# Patient Record
Sex: Male | Born: 1945 | Race: White | Hispanic: No | Smoking: Former smoker
Health system: Southern US, Community
[De-identification: ages and names within clinical notes are randomized; demographics above are authoritative.]

## PROBLEM LIST (undated history)

## (undated) DIAGNOSIS — G8929 Other chronic pain: Secondary | ICD-10-CM

## (undated) DIAGNOSIS — I5032 Chronic diastolic (congestive) heart failure: Secondary | ICD-10-CM

## (undated) DIAGNOSIS — E538 Deficiency of other specified B group vitamins: Secondary | ICD-10-CM

## (undated) DIAGNOSIS — N400 Enlarged prostate without lower urinary tract symptoms: Secondary | ICD-10-CM

## (undated) DIAGNOSIS — J449 Chronic obstructive pulmonary disease, unspecified: Secondary | ICD-10-CM

## (undated) DIAGNOSIS — E785 Hyperlipidemia, unspecified: Secondary | ICD-10-CM

## (undated) DIAGNOSIS — I714 Abdominal aortic aneurysm, without rupture, unspecified: Secondary | ICD-10-CM

## (undated) DIAGNOSIS — R635 Abnormal weight gain: Secondary | ICD-10-CM

## (undated) DIAGNOSIS — I10 Essential (primary) hypertension: Secondary | ICD-10-CM

## (undated) DIAGNOSIS — R911 Solitary pulmonary nodule: Secondary | ICD-10-CM

## (undated) DIAGNOSIS — K219 Gastro-esophageal reflux disease without esophagitis: Secondary | ICD-10-CM

## (undated) DIAGNOSIS — I4892 Unspecified atrial flutter: Secondary | ICD-10-CM

## (undated) DIAGNOSIS — G4730 Sleep apnea, unspecified: Secondary | ICD-10-CM

## (undated) DIAGNOSIS — F40240 Claustrophobia: Secondary | ICD-10-CM

## (undated) DIAGNOSIS — R9389 Abnormal findings on diagnostic imaging of other specified body structures: Secondary | ICD-10-CM

## (undated) HISTORY — DX: Abdominal aortic aneurysm, without rupture: I71.4

## (undated) HISTORY — PX: JOINT REPLACEMENT: SHX530

## (undated) HISTORY — DX: Other chronic pain: G89.29

## (undated) HISTORY — DX: Abnormal findings on diagnostic imaging of other specified body structures: R93.89

## (undated) HISTORY — DX: Unspecified atrial flutter: I48.92

## (undated) HISTORY — PX: BACK SURGERY: SHX140

## (undated) HISTORY — PX: KNEE SURGERY: SHX244

## (undated) HISTORY — DX: Abnormal weight gain: R63.5

## (undated) HISTORY — DX: Abdominal aortic aneurysm, without rupture, unspecified: I71.40

## (undated) HISTORY — PX: KYPHOSIS SURGERY: SHX114

## (undated) HISTORY — PX: CARDIOVERSION: SHX1299

## (undated) HISTORY — PX: OTHER SURGICAL HISTORY: SHX169

## (undated) HISTORY — DX: Solitary pulmonary nodule: R91.1

---
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
## 2004-02-12 ENCOUNTER — Inpatient Hospital Stay (HOSPITAL_BASED_OUTPATIENT_CLINIC_OR_DEPARTMENT_OTHER): Admission: RE | Admit: 2004-02-12 | Discharge: 2004-02-12 | Payer: Self-pay | Admitting: Cardiology

## 2004-02-12 ENCOUNTER — Ambulatory Visit (HOSPITAL_COMMUNITY): Admission: RE | Admit: 2004-02-12 | Discharge: 2004-02-12 | Payer: Self-pay | Admitting: Cardiology

## 2005-08-05 ENCOUNTER — Ambulatory Visit (HOSPITAL_COMMUNITY): Admission: RE | Admit: 2005-08-05 | Discharge: 2005-08-05 | Payer: Self-pay | Admitting: Optometry

## 2005-08-05 ENCOUNTER — Ambulatory Visit: Payer: Self-pay | Admitting: Internal Medicine

## 2008-08-16 ENCOUNTER — Ambulatory Visit: Payer: Self-pay | Admitting: *Deleted

## 2008-08-16 ENCOUNTER — Emergency Department (HOSPITAL_COMMUNITY): Admission: EM | Admit: 2008-08-16 | Discharge: 2008-08-17 | Payer: Self-pay | Admitting: Emergency Medicine

## 2008-08-17 ENCOUNTER — Ambulatory Visit: Payer: Self-pay | Admitting: Internal Medicine

## 2008-08-17 ENCOUNTER — Ambulatory Visit: Payer: Self-pay | Admitting: Cardiology

## 2008-08-17 ENCOUNTER — Encounter (INDEPENDENT_AMBULATORY_CARE_PROVIDER_SITE_OTHER): Payer: Self-pay | Admitting: Internal Medicine

## 2008-08-17 ENCOUNTER — Inpatient Hospital Stay (HOSPITAL_COMMUNITY): Admission: AD | Admit: 2008-08-17 | Discharge: 2008-08-25 | Payer: Self-pay | Admitting: Internal Medicine

## 2008-08-23 ENCOUNTER — Ambulatory Visit: Payer: Self-pay | Admitting: Cardiovascular Disease

## 2008-09-08 ENCOUNTER — Ambulatory Visit: Payer: Self-pay | Admitting: Internal Medicine

## 2008-09-08 DIAGNOSIS — I4892 Unspecified atrial flutter: Secondary | ICD-10-CM

## 2008-09-08 DIAGNOSIS — I483 Typical atrial flutter: Secondary | ICD-10-CM | POA: Insufficient documentation

## 2008-09-08 DIAGNOSIS — J984 Other disorders of lung: Secondary | ICD-10-CM

## 2008-09-08 DIAGNOSIS — J449 Chronic obstructive pulmonary disease, unspecified: Secondary | ICD-10-CM

## 2008-09-15 ENCOUNTER — Ambulatory Visit: Payer: Self-pay | Admitting: Cardiology

## 2008-09-15 ENCOUNTER — Ambulatory Visit: Payer: Self-pay | Admitting: Cardiovascular Disease

## 2008-10-09 ENCOUNTER — Ambulatory Visit: Payer: Self-pay | Admitting: Internal Medicine

## 2008-10-09 DIAGNOSIS — I2789 Other specified pulmonary heart diseases: Secondary | ICD-10-CM | POA: Insufficient documentation

## 2008-11-08 ENCOUNTER — Telehealth (INDEPENDENT_AMBULATORY_CARE_PROVIDER_SITE_OTHER): Payer: Self-pay | Admitting: *Deleted

## 2008-11-20 ENCOUNTER — Ambulatory Visit: Payer: Self-pay | Admitting: Internal Medicine

## 2008-12-26 ENCOUNTER — Encounter: Payer: Self-pay | Admitting: *Deleted

## 2009-01-31 ENCOUNTER — Encounter: Payer: Self-pay | Admitting: *Deleted

## 2009-02-16 ENCOUNTER — Ambulatory Visit: Payer: Self-pay | Admitting: Internal Medicine

## 2009-02-21 ENCOUNTER — Ambulatory Visit: Payer: Self-pay | Admitting: Cardiology

## 2009-02-21 ENCOUNTER — Telehealth (INDEPENDENT_AMBULATORY_CARE_PROVIDER_SITE_OTHER): Payer: Self-pay | Admitting: *Deleted

## 2009-02-21 ENCOUNTER — Encounter: Payer: Self-pay | Admitting: *Deleted

## 2009-02-22 ENCOUNTER — Ambulatory Visit: Payer: Self-pay

## 2009-02-22 ENCOUNTER — Encounter: Payer: Self-pay | Admitting: Internal Medicine

## 2009-02-26 ENCOUNTER — Encounter: Payer: Self-pay | Admitting: Internal Medicine

## 2009-02-26 ENCOUNTER — Telehealth: Payer: Self-pay | Admitting: Cardiology

## 2009-03-01 ENCOUNTER — Encounter: Payer: Self-pay | Admitting: Internal Medicine

## 2009-03-12 ENCOUNTER — Inpatient Hospital Stay (HOSPITAL_COMMUNITY): Admission: RE | Admit: 2009-03-12 | Discharge: 2009-03-19 | Payer: Self-pay | Admitting: Orthopedic Surgery

## 2009-04-23 ENCOUNTER — Ambulatory Visit: Payer: Self-pay | Admitting: Internal Medicine

## 2009-04-24 ENCOUNTER — Encounter: Payer: Self-pay | Admitting: Internal Medicine

## 2009-05-14 ENCOUNTER — Telehealth (INDEPENDENT_AMBULATORY_CARE_PROVIDER_SITE_OTHER): Payer: Self-pay | Admitting: *Deleted

## 2009-05-16 ENCOUNTER — Encounter: Payer: Self-pay | Admitting: Internal Medicine

## 2009-07-05 ENCOUNTER — Ambulatory Visit: Payer: Self-pay | Admitting: Internal Medicine

## 2009-07-13 ENCOUNTER — Ambulatory Visit: Payer: Self-pay | Admitting: Cardiovascular Disease

## 2009-07-13 ENCOUNTER — Telehealth (INDEPENDENT_AMBULATORY_CARE_PROVIDER_SITE_OTHER): Payer: Self-pay | Admitting: *Deleted

## 2009-08-20 ENCOUNTER — Ambulatory Visit: Payer: Self-pay | Admitting: Internal Medicine

## 2009-10-27 ENCOUNTER — Inpatient Hospital Stay (HOSPITAL_COMMUNITY): Admission: EM | Admit: 2009-10-27 | Discharge: 2009-11-04 | Payer: Self-pay | Admitting: Emergency Medicine

## 2009-11-02 ENCOUNTER — Encounter: Payer: Self-pay | Admitting: Orthopedic Surgery

## 2009-11-16 ENCOUNTER — Encounter: Payer: Self-pay | Admitting: Interventional Radiology

## 2009-12-26 ENCOUNTER — Encounter: Admission: RE | Admit: 2009-12-26 | Discharge: 2009-12-26 | Payer: Self-pay | Admitting: Family Medicine

## 2010-01-11 ENCOUNTER — Encounter (INDEPENDENT_AMBULATORY_CARE_PROVIDER_SITE_OTHER): Payer: Self-pay | Admitting: *Deleted

## 2010-01-25 ENCOUNTER — Encounter: Payer: Self-pay | Admitting: Cardiology

## 2010-02-06 ENCOUNTER — Ambulatory Visit: Payer: Self-pay | Admitting: Cardiology

## 2010-02-06 DIAGNOSIS — I739 Peripheral vascular disease, unspecified: Secondary | ICD-10-CM

## 2010-02-07 ENCOUNTER — Encounter: Payer: Self-pay | Admitting: Cardiology

## 2010-02-18 ENCOUNTER — Telehealth (INDEPENDENT_AMBULATORY_CARE_PROVIDER_SITE_OTHER): Payer: Self-pay | Admitting: *Deleted

## 2010-06-10 ENCOUNTER — Telehealth (INDEPENDENT_AMBULATORY_CARE_PROVIDER_SITE_OTHER): Payer: Self-pay | Admitting: *Deleted

## 2010-06-11 ENCOUNTER — Telehealth (INDEPENDENT_AMBULATORY_CARE_PROVIDER_SITE_OTHER): Payer: Self-pay | Admitting: *Deleted

## 2010-06-19 ENCOUNTER — Ambulatory Visit (HOSPITAL_COMMUNITY)
Admission: RE | Admit: 2010-06-19 | Discharge: 2010-06-20 | Payer: Self-pay | Source: Home / Self Care | Admitting: Orthopedic Surgery

## 2010-06-25 ENCOUNTER — Telehealth: Payer: Self-pay | Admitting: Cardiology

## 2010-07-12 ENCOUNTER — Ambulatory Visit (HOSPITAL_COMMUNITY)
Admission: RE | Admit: 2010-07-12 | Discharge: 2010-07-12 | Payer: Self-pay | Source: Home / Self Care | Attending: Interventional Radiology | Admitting: Interventional Radiology

## 2010-07-18 ENCOUNTER — Ambulatory Visit (HOSPITAL_COMMUNITY)
Admission: RE | Admit: 2010-07-18 | Discharge: 2010-07-18 | Payer: Self-pay | Source: Home / Self Care | Attending: Interventional Radiology | Admitting: Interventional Radiology

## 2010-08-15 IMAGING — CT CT CHEST W/ CM
3 series · 17 of 29 positions shown, 19 images · IV contrast (80 ml omni 300)
Comparison: Chest radiograph 08/16/2008

CLINICAL DATA: Shortness of breath, abnormal chest x-ray

CT CHEST WITH CONTRAST
TECHNIQUE: Multidetector CT imaging of the chest was performed
following the standard protocol during bolus administration of
intravenous contrast.
Contrast: 80 ml Omnipaque

[Series 2: routine chest · axial · 0.91mm/px · z∈[-369,-149]mm · 5 of 74 slices shown, 7 images]
[im 15/74  mediastinal]
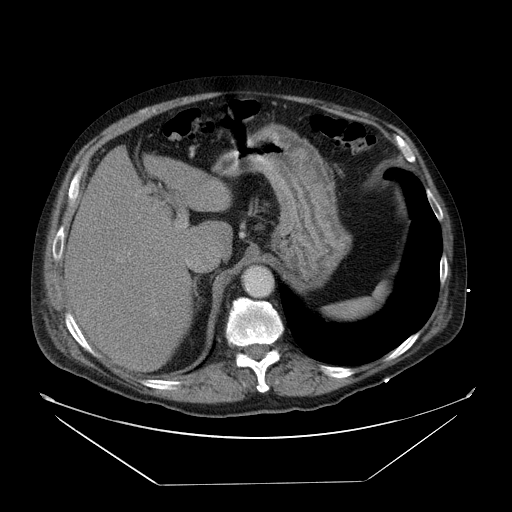
[im 15/74  lung]
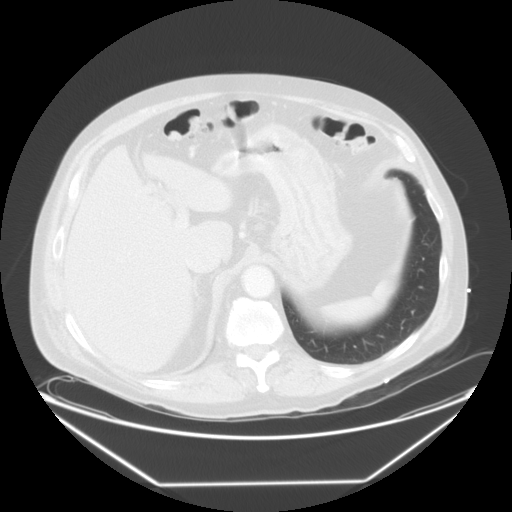
[im 30/74  lung]
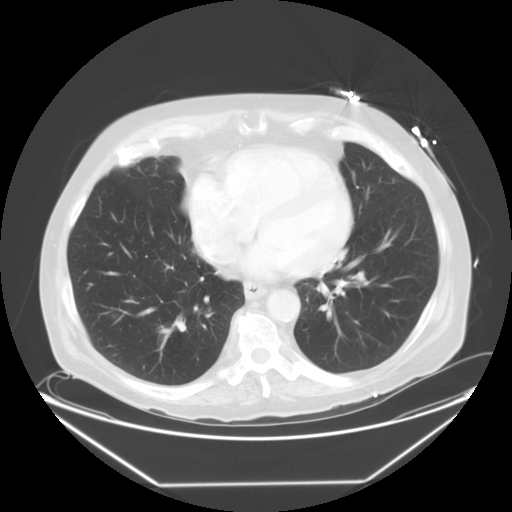
[im 41/74  lung]
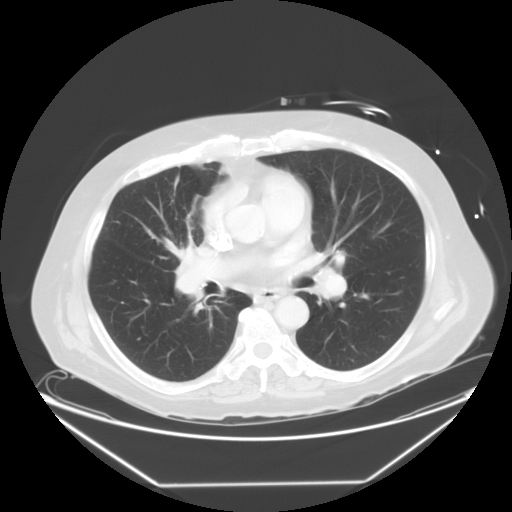
[im 44/74  lung]
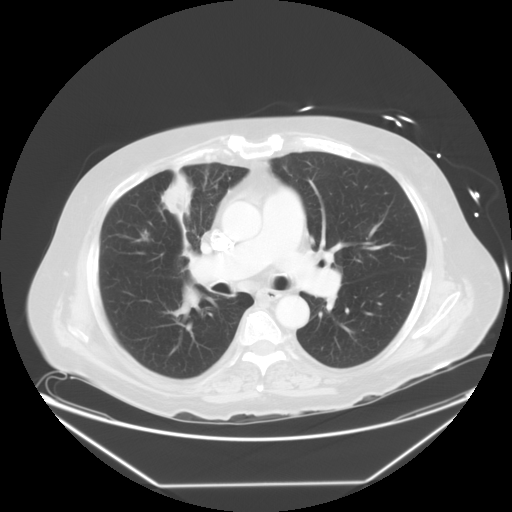
[im 59/74  mediastinal]
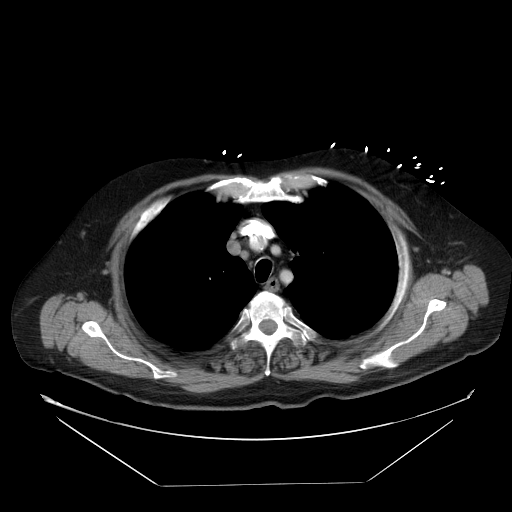
[im 59/74  lung]
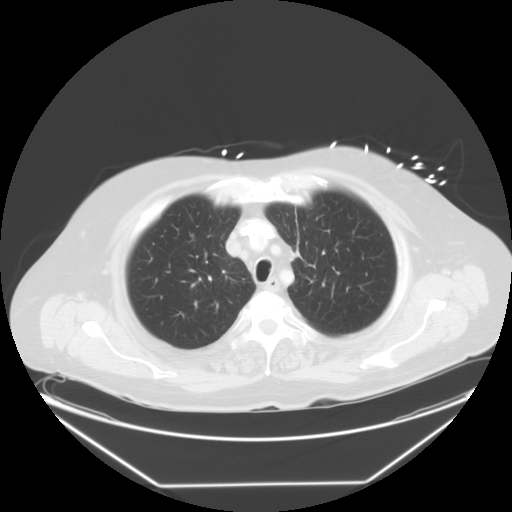

[Series 400: reformatted · sagittal · 0.90mm/px · 8 of 143 slices shown (1 of 2)]
[im 12/143  lung]
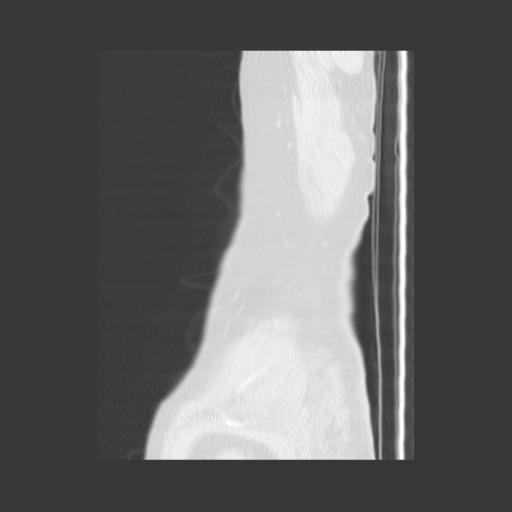
[im 36/143  lung]
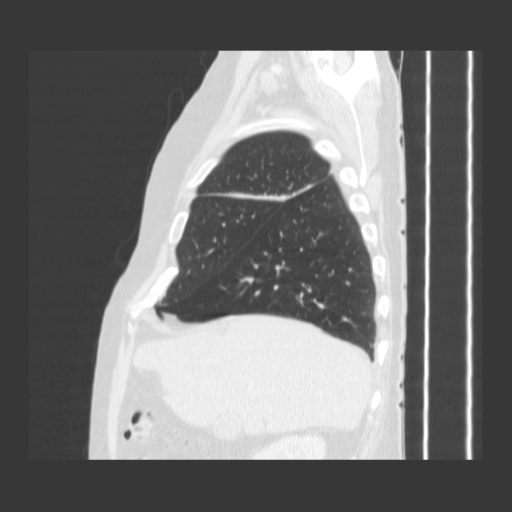
[im 48/143  lung]
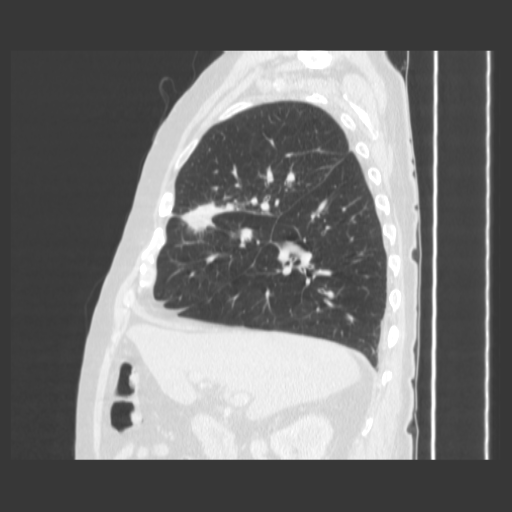
[im 60/143  lung]
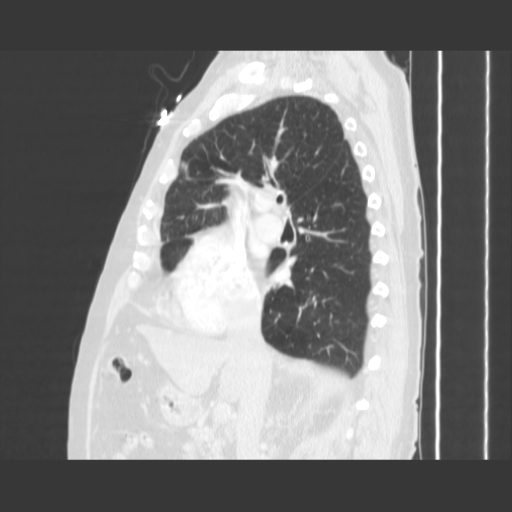
[im 83/143  lung]
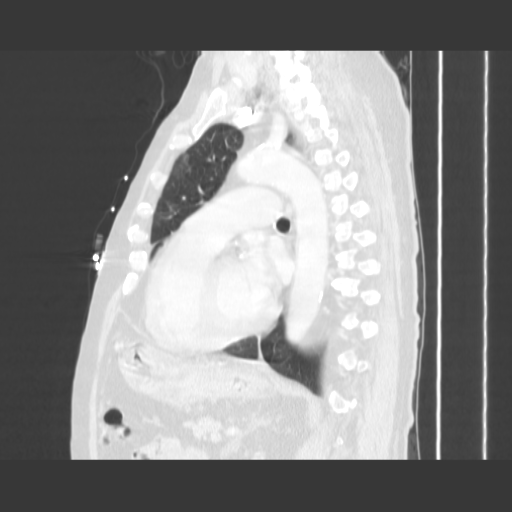
[im 95/143  lung]
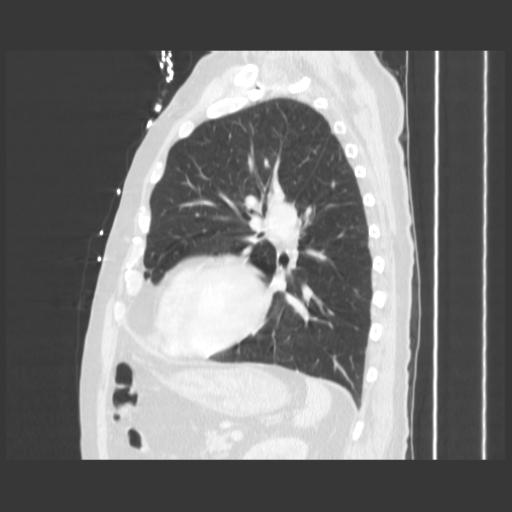
[im 107/143  lung]
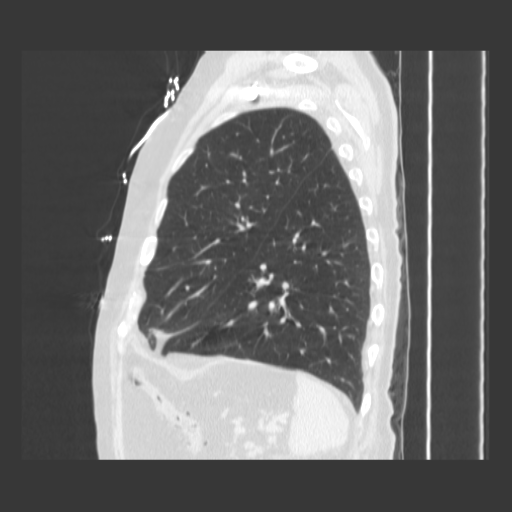
[im 131/143  lung]
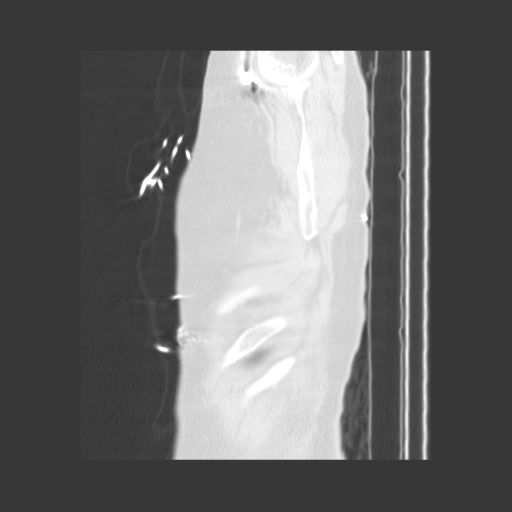

[Series 401: reformatted · coronal · 0.90mm/px · 4 of 96 slices shown (2 of 2)]
[im 12/96  lung]
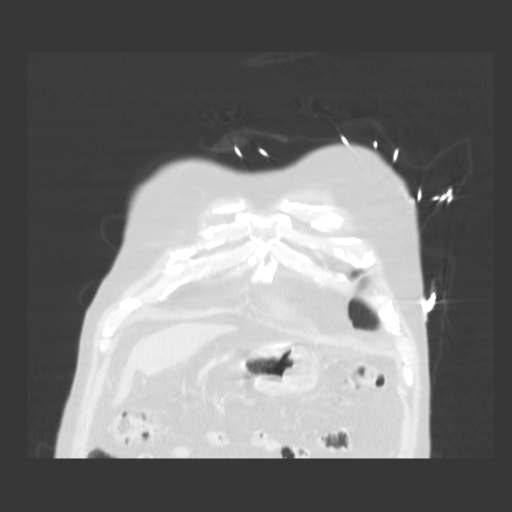
[im 24/96  lung]
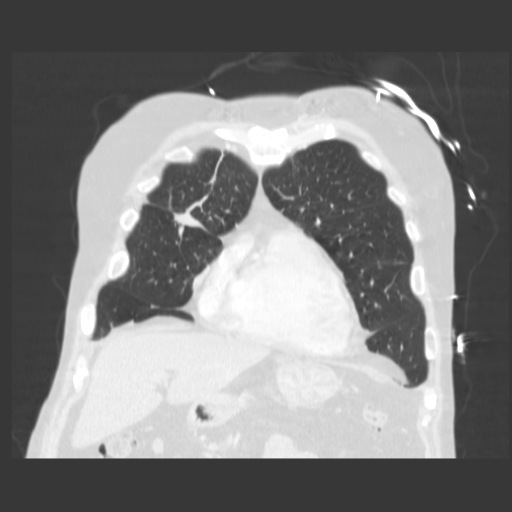
[im 36/96  lung]
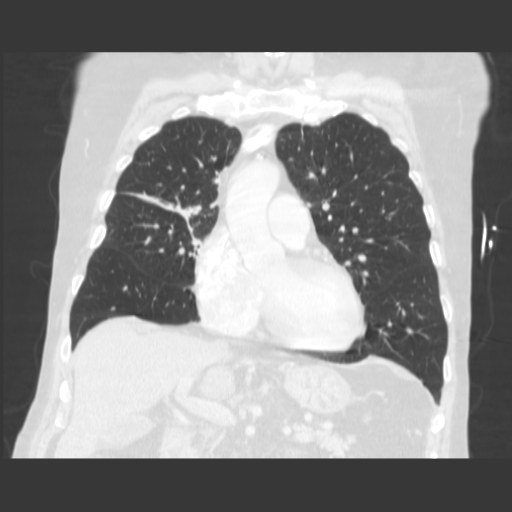
[im 48/96  lung]
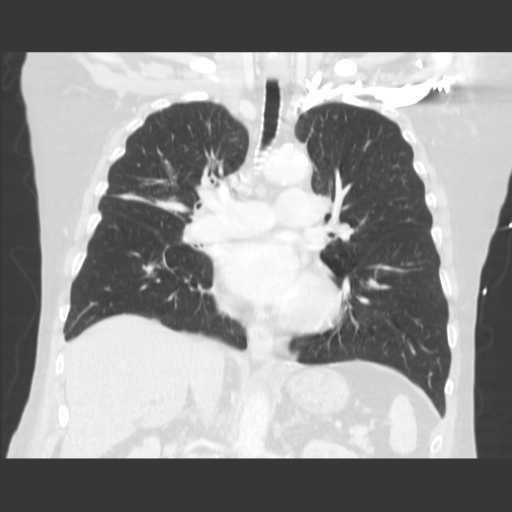

[17 of 29 positions shown; findings below may reference images not displayed]

FINDINGS: No evidence of axillary or supraclavicular.  There is
mild mediastinal lymph adenopathy with 1 cm short axis right lower
paratracheal, precarinal, subcarinal lymph nodes.  No pericardial
effusion.

There is pleural parenchymal thickening along the horizontal
fissure in the right lung which corresponds to the abnormality on
chest radiograph.  This pleural parenchymal thickening measures 6
cm x 2.5 cm x 2.5 cm in greatest dimension. Additional minor
thickening along the fissure.  There is an 11 millimeter nodule
over the right hemidiaphragm (image 49).  This is likely subpleural
in location.  The airway appears normal.

Limited view of the upper abdomen demonstrates a tiny 8 mm
hypodensity within the anterior right hepatic lobe which too small
to characterize.  A second hypodensity measuring 11 mm within the
caudate lobe.  Adrenal glands appear normal.

Limited view of the skeleton is unremarkable.
IMPRESSION: 1.  Mass like pleural parenchymal thickening along the right
horizontal fissure.  This has an appearance of chronic inflammatory
process; however cannot rule out a low grade adenocarcinoma which
can have similar appearance.  I would recommend a follow-up CT and
in 3 to 6 months to evaluate stability.  Alternatively a
percutaneous biopsy may be warranted.  Consider thoracic surgery
consult.
2.  An 11 mm subpleural nodule over the right diaphragm.  Recommend
attention on follow-up additionally.
3.  Outside radiographs or CT  may be of value in establishing
chronicity; however,  I would still recommend follow-up.

## 2010-08-18 ENCOUNTER — Encounter: Payer: Self-pay | Admitting: Family Medicine

## 2010-08-28 NOTE — Letter (Signed)
Summary: Appointment - Reschedule  [facility], Main Office  [address]   [place], [place] [address]   Phone: [phone]  Fax: [phone]     January 11, 2010 MRN: [id number]   [facility] [address] [place], [place]  [address]   Dear Mr. Bennette,   Due to a change in our office schedule, your appointment on  June 22,2011 at 11:45 must be changed.  It is very important that we reach you to reschedule this appointment. We look forward to participating in your health care needs. Please contact us at the number listed above at your earliest convenience to reschedule this appointment.     Sincerely, Harrie Foreman Scheduling Team

## 2010-08-28 NOTE — Miscellaneous (Signed)
  Clinical Lists Changes  Observations: Added new observation of NUCLEAR NOS: Exercise Capacity: Adenosine study with no exercise. ECG Impression: Baseline: NSR with RBBB; No significant ST segment change with adenosine. Overall Impression: Abnormal stress nuclear study. 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 However there is no corresponding wall motion abnormality on SPECT imaging so may be diaphragmatic attenuation. No ischemia. There is increased RV uptake suggested of elevated R-sided pressures.    (02/22/2009 15:52)      Nuclear Study  Procedure date:  02/22/2009  Findings:      Exercise Capacity: Adenosine study with no exercise. ECG Impression: Baseline: NSR with RBBB; No significant ST segment change with adenosine. Overall Impression: Abnormal stress nuclear study. 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 However there is no corresponding wall motion abnormality on SPECT imaging so may be diaphragmatic attenuation. No ischemia. There is increased RV uptake suggested of elevated R-sided pressures.

## 2010-08-28 NOTE — Assessment & Plan Note (Signed)
Summary:  Cardiology  Medications Added DILTIAZEM HCL CR 120 MG XR12H-CAP (DILTIAZEM HCL) one twice a day OMEPRAZOLE 20 MG CPDR (OMEPRAZOLE) 1 by mouth daily AMBIEN 10 MG TABS (ZOLPIDEM TARTRATE) as needed MULTIVITAMINS   TABS (MULTIPLE VITAMIN) 1 by mouth daily CALCIUM 1200-1000 MG-UNIT CHEW (CALCIUM CARBONATE-VIT D-MIN) 1 by mouth daily CYCLOBENZAPRINE HCL 10 MG TABS (CYCLOBENZAPRINE HCL) as needed FOSAMAX 70 MG TABS (ALENDRONATE SODIUM) 1 by mouth weekly FLOMAX 0.4 MG CAPS (TAMSULOSIN HCL) 1 by mouth at bedtime      Allergies Added: NKDA  Visit Type:  Follow-up Primary Provider:  Dr. Ardeen Garland  CC:  Atrial Flutter.  History of Present Illness: The present for followup of the above. Since I last saw him in the office he was hospitalized with back pain requiring kyphoplasty. His Coumadin was interrupted for this. He is still having back pain and in particular left hip pain related to an orthopedic problem. He does ambulate with a cane and gets around as best he can. He denies any cardiac complaints and in particular has had no palpitations, presyncope or syncope. He has had no chest pressure, neck or arm discomfort. He has had no new shortness of breath, PND or orthopnea.  Current Medications (verified): 1)  Diltiazem Hcl Cr 120 Mg Xr12h-Cap (Diltiazem Hcl) .... One Twice A Day 2)  Bayer Low Strength 81 Mg Tbec (Aspirin) .[name].. 1 Once Daily 3)  Omeprazole 20 Mg Cpdr (Omeprazole) .[name].. 1 By Mouth Daily 4)  Symbicort 160-4.5 Mcg/act  Aero (Budesonide-Formoterol Fumarate) .... 2 Puffs First Thing  in Am and 2 Puffs Again in Pm About 12 Hours Later 5)  Vicodin 5-500 Mg Tabs (Hydrocodone-Acetaminophen) .... As Needed 6)  Ambien 10 Mg Tabs (Zolpidem Tartrate) .... As Needed 7)  Multivitamins   Tabs (Multiple Vitamin) .[name].. 1 By Mouth Daily 8)  Calcium 1200-1000 Mg-Unit Chew (Calcium Carbonate-Vit D-Min) .[name].. 1 By Mouth Daily 9)  Cyclobenzaprine Hcl 10 Mg Tabs (Cyclobenzaprine Hcl)  .... As Needed 10)  Fosamax 70 Mg Tabs (Alendronate Sodium) .[name].. 1 By Mouth Weekly 11)  Flomax 0.4 Mg Caps (Tamsulosin Hcl) .[name].. 1 By Mouth At Bedtime  Allergies (verified): No Known Drug Allergies  Past History:  Past Medical History: Reviewed history from 08/20/2009 and no changes required.  ATRIAL FLUTTER (ICD-427.32)      -Echo 09/17/2008 nl ef, mild lae, rv mod dilated, pasystolic 46      -Noct desat 02/28/2009, refused 02 @[facility]  COPD..............................................................................................Marland KitchenWert    - PFTs 10/09/08 FEV1 1.39 ( 44%)ratio 40,  DLCO 63%.  16% improvement after bronchodilator    - Overnight pulse ox 02/28/2009  5:65m spent with sat < 89 sleeping > declined further 02 July 05, 2009     - HFA 75% April 23, 2009  SPN right mid zone     - first detected 08/16/08      - See CT 08/16/08 [facility] Weight Gain    - Target wt  =  196  for BMI < 30   Past Surgical History: Left hip replacement Aug 2010 Kyphoplasty  Review of Systems       As stated in the HPI and negative for all other systems.   Vital Signs:  Patient profile:   65 year old male Height:      70 inches Weight:      211 pounds BMI:     30.38 Pulse rate:   88 / minute Resp:     18 per minute BP sitting:  134 / 82  (right arm)  Vitals Entered By: Marrion Coy, CNA (February 06, 2010 11:27 AM)  Physical Exam  General:  Well developed, well nourished, in no acute distress. Head:  normocephalic and atraumatic Eyes:  PERRLA/EOM intact; conjunctiva and lids normal. Mouth:  Teeth, gums and palate normal. Oral mucosa normal. Neck:  Neck supple, no JVD. No masses, thyromegaly or abnormal cervical nodes. Chest Wall:  no deformities or breast masses noted Lungs:  mild expiratory wheezes Abdomen:  Bowel sounds positive; abdomen soft and non-tender without masses, organomegaly, or hernias noted. No hepatosplenomegaly. Msk:  Back normal, normal gait. Muscle strength and tone  normal. Extremities:  No clubbing or cyanosis. Neurologic:  Alert and oriented x 3. Skin:  Intact without lesions or rashes. Cervical Nodes:  no significant adenopathy Inguinal Nodes:  no significant adenopathy Psych:  Normal affect.   Detailed Cardiovascular Exam  Neck    Carotids: Carotids full and equal bilaterally without bruits.      Neck Veins: Normal, no JVD.    Heart    Inspection: no deformities or lifts noted.      Palpation: normal PMI with no thrills palpable.      Auscultation: regular rate and rhythm, S1, S2 without murmurs, rubs, gallops, or clicks.    Vascular    Abdominal Aorta: no palpable masses, pulsations, or audible bruits.      Femoral Pulses: decreased right femoral    Pedal Pulses: absent right dorsalis pedis pulse, absent right posterior tibial pulse, diminished left dorsalis pedis pulse, and absent left dorsalis pedis pulse.      Radial Pulses: diminished left radial pulse.     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 There has been no documented atrial fibrillation or flutter that I can identify. He has had no symptoms. In addition his CHADS score is one. Therefore, I do not think Coumadin should continue. We had a long discussion about the risks benefits of this. He will continue aspirin alone.  Of note he requested his Cardizem be 120 b.i.d. for cost and I will make this change. Orders: EKG w/ Interpretation (93000)  Problem # 2:  PVD (ICD-443.9) He understands the need for risk reduction with lipid management in particular.  I would suggest a lipid profile but will defer to his primary physician. He would benefit from a statin statistically.  Problem # 3:  COPD UNSPECIFIED (ICD-496) He does have some wheezing. No defer management to his primary physician. This is  stable.  Patient Instructions: 1)  Your physician recommends that you schedule a follow-up appointment in: 12 months with Dr Antoine Poche 2)  Your physician has recommended you make the following change in your medication: Change Cardizem to 120 mg twice a day Prescriptions: DILTIAZEM HCL CR 120 MG XR12H-CAP (DILTIAZEM HCL) one twice a day  #180 x 3   Entered by:   Charolotte Capuchin, RN   Authorized by:   Rollene Rotunda, MD, [facility]   Signed by:   Charolotte Capuchin, RN on 02/06/2010   Method used:   Electronically to        [facility]  [address] (retail)       [address]       [place], [place]  [address]  Ph: [phone]       Fax: [phone]   RxID:   [id number]  I have reviewed and approved all prescriptions at the time of this visit. Rollene Rotunda, MD, [facility]  February 06, 2010 12:29 PM

## 2010-08-28 NOTE — Progress Notes (Signed)
Summary: Records Request   Faxed Stress to [facility] ([phone]). Debby Freiberg  June 11, 2010 9:27 AM

## 2010-08-28 NOTE — Assessment & Plan Note (Signed)
Summary: Pulmonary/ extended summary f/u ov   Primary Provider/Referring Provider:  Sherene Sires  CC:  6 wk followup with cxr.  Pt states that his breathing is fine.  He denies any complaints today.[name]  History of Present Illness: 42 yowm quit smoking 2004 with GOLD III COPD by PFT's  Admit [facility] 1/21 -29/2010 with 1 year orthopnea/pnd then gradual  anasarca and caf > coumadin/ cardioversion 08/23/08 > marked improvement, discharged on 02 but not using.  September 08, 2008 post hosp fu no problem with doe walked at [facility] 2/11 did ok except legs weak. no cough.    rec use spiriva as maint plus as needed albuterol, feels the albuterol really makes a difference.   October 09, 2008 returns for PFTs as requested stating he still feels the need for using albuterol at least two or 3 times a week.  However, he is more limited by orthopedic constraints at this point than dyspnea.  rec: stop spiriva - think of it like high octane fuel - it will give you better perfomormance if used regularly each am but you may not need it. Try Symbicort 160 2 puffs first thing  in am and 2 puffs again in pm about 12 hours later and work on perfecting technique Please schedule a follow-up appointment in 6 weeks, sooner if needed  use 02 at bedtime automatically  as needed during the day   November 20, 2008 better, just using symbicort in am and no spiriva at all, no other rx. no change in rx.   February 16, 2009 for f/ucxr ov and clearance for orhtopedic surgery,  limited by left hip 100 ft and sleeping ok on side , no am cough or congestion. approved for left hip surgery  Mar 12, 2009 [facility] admit for [facility]  April 23, 2009 post op f/u f/u doing outpt rehab  rec  Work on inhaler technique:  relax and blow all the way out then take a nice smooth deep breath back in, triggering the inhaler at same time you start breathing in  wear 02 automatically at bedtime for now at 2 lpm  July 05, 2009 ov declines 02, having trouble  with insurance paying for his care  August 20, 2009 63 yowm quit smoking 2004   Admit [facility] 1/21 -29/2010 with 1 year orthopnea/pnd then gradual  anasarca and caf > coumadin/ cardioversion 08/23/08 > marked improvement, discharged on 02 but not using. August 20, 2009 does fine flat on flat surface trouble with inclines, no cough, no cp. Only uses symbicort 160 2 puffs each am no prns or pm use of symbicort.  Pt denies any significant sore throat, dysphagia, itching, sneezing,  nasal congestion or excess secretions,  fever, chills, sweats, unintended wt loss, pleuritic or exertional cp, hempoptysis, change in activity tolerance  orthopnea pnd or leg swelling Pt also denies any obvious fluctuation in symptoms with weather or environmental change or other alleviating or aggravating factors.        Current Medications (verified): 1)  Dilt-Cd 240 Mg Xr24h-Cap (Diltiazem Hcl Coated Beads) .[name].. 1 Once Daily 2)  Bayer Low Strength 81 Mg Tbec (Aspirin) .[name].. 1 Once Daily 3)  Coumadin 5 Mg Tabs (Warfarin Sodium) .... As Directed 4)  Pantoprazole Sodium 40 Mg Tbec (Pantoprazole Sodium) .[name].. 1 Once Daily 5)  Lasix 20 Mg Tabs (Furosemide) .[name].. 1 Once Daily 6)  Symbicort 160-4.5 Mcg/act  Aero (Budesonide-Formoterol Fumarate) .... 2 Puffs First Thing  in Am and 2 Puffs Again in  Pm About 12 Hours Later 7)  Vicodin 5-500 Mg Tabs (Hydrocodone-Acetaminophen) .... As Needed 8)  *02  2lpm At Bedtime  Allergies (verified): No Known Drug Allergies  Past History:  Past Medical History:  ATRIAL FLUTTER (ICD-427.32)      -Echo 09/17/2008 nl ef, mild lae, rv mod dilated, pasystolic 46      -Noct desat 02/28/2009, refused 02 @[facility]  COPD..............................................................................................Marland KitchenWert    - PFTs 10/09/08 FEV1 1.39 ( 44%)ratio 40,  DLCO 63%.  16% improvement after bronchodilator    - Overnight pulse ox 02/28/2009  5:66m spent with sat < 89 sleeping > declined further 02  July 05, 2009     - HFA 75% April 23, 2009  SPN right mid zone     - first detected 08/16/08      - See CT 08/16/08 [facility] Weight Gain    - Target wt  =  196  for BMI < 30   Vital Signs:  Patient profile:   65 year old male Weight:      232 pounds O2 Sat:      93 % on Room air Temp:     97.9 degrees F oral Pulse rate:   100 / minute BP sitting:   138 / 80  (left arm)  Vitals Entered By: Vernie Murders (August 20, 2009 9:35 AM)  O2 Flow:  Room air  Physical Exam  Additional Exam:  wt 214  September 08, 2008 >   228 February 16, 2009 > 222 April 23, 2009 > 223 July 05, 2009 > 232 August 20, 2009  HEENT mild turbinate edema.  Oropharynx no thrush or excess pnd or cobblestoning.  No JVD or cervical adenopathy. Mild accessory muscle hypertrophy. Trachea midline, nl thryroid. Chest was hyperinflated by percussion with diminished breath sounds and marked increased exp time without wheeze. Hoover sign positive onset of inspiration. Regular rate and rhythm without murmur gallop or rub or increase P2. No edema.  Decrease s1s2 Abd: no hsm, nl excursion. Ext warm without Cynosis or clubbing.    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  Orders: Est. Patient Level IV (95621)  Problem # 3:  PULMONARY NODULE (ICD-518.89)  Not viz on plain cxr - the area of greatest concern on plain film looks much better so clearly inflammatory though not completely off the hook here - clealry not a candidate for early surgical intervention so follow conservatively with cxr in 6 months. placed in tickle file.   Each maintenance medication was reviewed in detail including most importantly the difference between maintenance and as needed and under what  circumstances the prns are to be used. See instructions for specific recommendations   Other Orders: T-2 View CXR (71020TC)  Patient Instructions: 1)  Please schedule a follow-up appointment in 6 months with cxr

## 2010-08-28 NOTE — Progress Notes (Signed)
   DDS Request received sent to [facility]  June 10, 2010 9:28 AM

## 2010-08-28 NOTE — Progress Notes (Signed)
Summary: refill meds  Medications Added DILTIAZEM HCL CR 120 MG XR12H-CAP (DILTIAZEM HCL) one twice a day       Phone Note Refill Request Message from:  Patient on [facility] [address]  Refills Requested: Medication #1:  DILTIAZEM HCL CR 120 MG XR12H-CAP one twice a day [facility] [address].    Method Requested: Fax to Local Pharmacy Initial call taken by: Glynda Jaeger,  June 25, 2010 3:18 PM  Follow-up for Phone Call        RX sent into [facility] on [address]. NO answer at home number. Marrion Coy, CNA  June 26, 2010 9:43 AM  Follow-up by: Marrion Coy, CNA,  June 26, 2010 9:43 AM  Additional Follow-up for Phone Call Additional follow up Details #1::        pt states that [facility] did not get a fax. pt would like the nurse to call it in. Additional Follow-up by: Roe Coombs,  June 26, 2010 11:26 AM    Additional Follow-up for Phone Call Additional follow up Details #2::    NO answer at home number. Pharmacist called at [facility] on [address]. Rx already p[icked up and RX at [facility] states pick up to soon. RX on file at [facility] per pharmacist. Marrion Coy, CNA  June 26, 2010 2:49 PM  Follow-up by: Marrion Coy, CNA,  June 26, 2010 2:44 PM  New/Updated Medications: DILTIAZEM HCL CR 120 MG XR12H-CAP (DILTIAZEM HCL) one twice a day Prescriptions: DILTIAZEM HCL CR 120 MG XR12H-CAP (DILTIAZEM HCL) one twice a day  #60 x 6   Entered by:   Marrion Coy, CNA   Authorized by:   Rollene Rotunda, MD, [facility]   Signed by:   Marrion Coy, CNA on 06/26/2010   Method used:   Electronically to        [facility]  [phone]* (retail)       [address]       [place], [place]  [address]       Ph: [phone] or [phone]       Fax: [phone]   RxID:   [id number]

## 2010-08-28 NOTE — Progress Notes (Signed)
Summary: needs f/up cxr  ---- Converted from flag ---- ---- 08/20/2009 9:55 AM, Nyoka Cowden MD wrote: needs f/u ov with cxr ------------------------------  Called and spoke with pt.  Advised he is due to rov with cxr here this month. Pt states that he will have to call us back to sched this appt.  He has to go to [place] to visit his daughter who is sick and unsure when he will return here.  Looks like his last cxr was done in April 2011 at [facility]  February 18, 2010 2:42 PM relatively low risk cxr/ct so ok to f/u when returns to Gilman Schmidt MD  February 18, 2010 2:59 PM

## 2010-10-08 LAB — COMPREHENSIVE METABOLIC PANEL
Alkaline Phosphatase: 48 U/L (ref 39–117)
BUN: 13 mg/dL (ref 6–23)
CO2: 28 mEq/L (ref 19–32)
Chloride: 105 mEq/L (ref 96–112)
Creatinine, Ser: 0.81 mg/dL (ref 0.4–1.5)
GFR calc non Af Amer: >60 mL/min (ref >60)
Glucose, Bld: 82 mg/dL (ref 70–99)
Potassium: 4.2 mEq/L (ref 3.5–5.1)
Total Bilirubin: 0.7 mg/dL (ref 0.3–1.2)

## 2010-10-08 LAB — CBC
HCT: 41.3 % (ref 39.0–52.0)
MCH: 29.2 pg (ref 26.0–34.0)
MCV: 86.9 fL (ref 78.0–100.0)
RBC: 4.75 MIL/uL (ref 4.22–5.81)
WBC: 5.7 10*3/uL (ref 4.0–10.5)

## 2010-10-08 LAB — URINALYSIS, ROUTINE W REFLEX MICROSCOPIC
Bilirubin Urine: NEGATIVE
Hgb urine dipstick: NEGATIVE
Ketones, ur: NEGATIVE mg/dL
Protein, ur: NEGATIVE mg/dL
Specific Gravity, Urine: 1.026 (ref 1.005–1.030)
Urobilinogen, UA: 0.2 mg/dL (ref 0.0–1.0)

## 2010-10-08 LAB — PROTIME-INR
INR: 1 (ref 0.00–1.49)
Prothrombin Time: 13.4 seconds (ref 11.6–15.2)

## 2010-10-08 LAB — APTT: aPTT: 30 seconds (ref 24–37)

## 2010-10-16 LAB — POCT I-STAT, CHEM 8
HCT: 45 % (ref 39.0–52.0)
Hemoglobin: 15.3 g/dL (ref 13.0–17.0)
Potassium: 3.9 mEq/L (ref 3.5–5.1)
Sodium: 139 mEq/L (ref 135–145)

## 2010-10-16 LAB — CBC
HCT: 37.7 % — ABNORMAL LOW (ref 39.0–52.0)
HCT: 41.6 % (ref 39.0–52.0)
HCT: 42.4 % (ref 39.0–52.0)
Hemoglobin: 11.9 g/dL — ABNORMAL LOW (ref 13.0–17.0)
Hemoglobin: 13.4 g/dL (ref 13.0–17.0)
MCHC: 31.5 g/dL (ref 30.0–36.0)
MCV: 79.5 fL (ref 78.0–100.0)
MCV: 79.5 fL (ref 78.0–100.0)
MCV: 80.5 fL (ref 78.0–100.0)
Platelets: 199 10*3/uL (ref 150–400)
Platelets: 209 10*3/uL (ref 150–400)
Platelets: 241 10*3/uL (ref 150–400)
RDW: 23.1 % — ABNORMAL HIGH (ref 11.5–15.5)
WBC: 5.9 10*3/uL (ref 4.0–10.5)
WBC: 6.3 10*3/uL (ref 4.0–10.5)
WBC: 6.6 10*3/uL (ref 4.0–10.5)
WBC: 8.9 10*3/uL (ref 4.0–10.5)

## 2010-10-16 LAB — PROTIME-INR
INR: 1.08 (ref 0.00–1.49)
INR: 1.09 (ref 0.00–1.49)
INR: 2.07 — ABNORMAL HIGH (ref 0.00–1.49)
INR: 3.11 — ABNORMAL HIGH (ref 0.00–1.49)
Prothrombin Time: 18.6 seconds — ABNORMAL HIGH (ref 11.6–15.2)
Prothrombin Time: 27.5 seconds — ABNORMAL HIGH (ref 11.6–15.2)
Prothrombin Time: 31.8 seconds — ABNORMAL HIGH (ref 11.6–15.2)

## 2010-10-16 LAB — POCT CARDIAC MARKERS
CKMB, poc: 1.6 ng/mL (ref 1.0–8.0)
CKMB, poc: <1 ng/mL — ABNORMAL LOW (ref 1.0–8.0)
Myoglobin, poc: 46.5 ng/mL (ref 12–200)
Myoglobin, poc: 62.5 ng/mL (ref 12–200)

## 2010-10-16 LAB — CARDIAC PANEL(CRET KIN+CKTOT+MB+TROPI)
CK, MB: 1.3 ng/mL (ref 0.3–4.0)
CK, MB: 1.3 ng/mL (ref 0.3–4.0)
CK, MB: 1.5 ng/mL (ref 0.3–4.0)
CK, MB: 1.9 ng/mL (ref 0.3–4.0)
Relative Index: INVALID (ref 0.0–2.5)
Relative Index: INVALID (ref 0.0–2.5)
Relative Index: INVALID (ref 0.0–2.5)
Total CK: 66 U/L (ref 7–232)
Total CK: 79 U/L (ref 7–232)
Troponin I: 0.01 ng/mL (ref 0.00–0.06)
Troponin I: 0.01 ng/mL (ref 0.00–0.06)
Troponin I: 0.02 ng/mL (ref 0.00–0.06)

## 2010-10-16 LAB — DIFFERENTIAL
Basophils Relative: 0 % (ref 0–1)
Eosinophils Absolute: 0 10*3/uL (ref 0.0–0.7)
Eosinophils Relative: 0 % (ref 0–5)
Lymphs Abs: 0.8 10*3/uL (ref 0.7–4.0)
Monocytes Absolute: 0.4 10*3/uL (ref 0.1–1.0)

## 2010-10-16 LAB — URINALYSIS, ROUTINE W REFLEX MICROSCOPIC
Bilirubin Urine: NEGATIVE
Glucose, UA: NEGATIVE mg/dL
Hgb urine dipstick: NEGATIVE
Protein, ur: NEGATIVE mg/dL

## 2010-10-16 LAB — HEMOGLOBIN A1C: Mean Plasma Glucose: 134 mg/dL

## 2010-10-16 LAB — HEPARIN LEVEL (UNFRACTIONATED)
Heparin Unfractionated: 0.16 IU/mL — ABNORMAL LOW (ref 0.30–0.70)
Heparin Unfractionated: 0.32 IU/mL (ref 0.30–0.70)

## 2010-10-16 LAB — LIPID PANEL: HDL: 28 mg/dL — ABNORMAL LOW (ref >39)

## 2010-11-02 LAB — URINALYSIS, ROUTINE W REFLEX MICROSCOPIC
Ketones, ur: NEGATIVE mg/dL
Nitrite: NEGATIVE
Protein, ur: NEGATIVE mg/dL
Urobilinogen, UA: 0.2 mg/dL (ref 0.0–1.0)
pH: 5.5 (ref 5.0–8.0)

## 2010-11-02 LAB — PROTIME-INR
INR: 1.1 (ref 0.00–1.49)
INR: 2 — ABNORMAL HIGH (ref 0.00–1.49)
Prothrombin Time: 14 seconds (ref 11.6–15.2)
Prothrombin Time: 14.4 seconds (ref 11.6–15.2)
Prothrombin Time: 22.9 seconds — ABNORMAL HIGH (ref 11.6–15.2)
Prothrombin Time: 28.5 seconds — ABNORMAL HIGH (ref 11.6–15.2)

## 2010-11-02 LAB — BASIC METABOLIC PANEL
BUN: 11 mg/dL (ref 6–23)
Calcium: 8.1 mg/dL — ABNORMAL LOW (ref 8.4–10.5)
Creatinine, Ser: 0.66 mg/dL (ref 0.4–1.5)
GFR calc Af Amer: >60 mL/min (ref >60)
GFR calc Af Amer: >60 mL/min (ref >60)
GFR calc non Af Amer: >60 mL/min (ref >60)
GFR calc non Af Amer: >60 mL/min (ref >60)
Potassium: 4.4 mEq/L (ref 3.5–5.1)
Sodium: 137 mEq/L (ref 135–145)
Sodium: 138 mEq/L (ref 135–145)

## 2010-11-02 LAB — CBC
HCT: 30.1 % — ABNORMAL LOW (ref 39.0–52.0)
HCT: 32.6 % — ABNORMAL LOW (ref 39.0–52.0)
Hemoglobin: 10.7 g/dL — ABNORMAL LOW (ref 13.0–17.0)
Platelets: 161 10*3/uL (ref 150–400)
Platelets: 172 10*3/uL (ref 150–400)
Platelets: 210 10*3/uL (ref 150–400)
RBC: 3.7 MIL/uL — ABNORMAL LOW (ref 4.22–5.81)
RBC: 3.7 MIL/uL — ABNORMAL LOW (ref 4.22–5.81)
RDW: 18.9 % — ABNORMAL HIGH (ref 11.5–15.5)
WBC: 6.3 10*3/uL (ref 4.0–10.5)
WBC: 8.6 10*3/uL (ref 4.0–10.5)
WBC: 8.9 10*3/uL (ref 4.0–10.5)

## 2010-11-02 LAB — COMPREHENSIVE METABOLIC PANEL
ALT: 13 U/L (ref 0–53)
Albumin: 3.5 g/dL (ref 3.5–5.2)
Alkaline Phosphatase: 54 U/L (ref 39–117)
Calcium: 9.1 mg/dL (ref 8.4–10.5)
GFR calc Af Amer: >60 mL/min (ref >60)
Potassium: 3.8 mEq/L (ref 3.5–5.1)
Sodium: 144 mEq/L (ref 135–145)
Total Protein: 6.5 g/dL (ref 6.0–8.3)

## 2010-11-02 LAB — ABO/RH: ABO/RH(D): A POS

## 2010-11-02 LAB — APTT: aPTT: 24 seconds (ref 24–37)

## 2010-11-02 LAB — GLUCOSE, CAPILLARY: Glucose-Capillary: 134 mg/dL — ABNORMAL HIGH (ref 70–99)

## 2010-11-11 LAB — CBC
HCT: 46.2 % (ref 39.0–52.0)
HCT: 44.2 % (ref 39.0–52.0)
HCT: 44.5 % (ref 39.0–52.0)
HCT: 45.8 % (ref 39.0–52.0)
HCT: 46.9 % (ref 39.0–52.0)
Hemoglobin: 15.3 g/dL (ref 13.0–17.0)
Hemoglobin: 14.2 g/dL (ref 13.0–17.0)
Hemoglobin: 14.5 g/dL (ref 13.0–17.0)
Hemoglobin: 14.6 g/dL (ref 13.0–17.0)
Hemoglobin: 14.6 g/dL (ref 13.0–17.0)
Hemoglobin: 15.1 g/dL (ref 13.0–17.0)
Hemoglobin: 15.2 g/dL (ref 13.0–17.0)
MCHC: 32.5 g/dL (ref 30.0–36.0)
MCHC: 32.6 g/dL (ref 30.0–36.0)
MCHC: 32.8 g/dL (ref 30.0–36.0)
MCHC: 32.8 g/dL (ref 30.0–36.0)
MCHC: 33.1 g/dL (ref 30.0–36.0)
MCHC: 33.2 g/dL (ref 30.0–36.0)
MCHC: 33.5 g/dL (ref 30.0–36.0)
MCV: 100 fL (ref 78.0–100.0)
MCV: 99.1 fL (ref 78.0–100.0)
MCV: 99.1 fL (ref 78.0–100.0)
MCV: 99.3 fL (ref 78.0–100.0)
MCV: 99.4 fL (ref 78.0–100.0)
MCV: 99.6 fL (ref 78.0–100.0)
Platelets: 161 10*3/uL (ref 150–400)
Platelets: 166 10*3/uL (ref 150–400)
RBC: 4.3 MIL/uL (ref 4.22–5.81)
RBC: 4.46 MIL/uL (ref 4.22–5.81)
RBC: 4.46 MIL/uL (ref 4.22–5.81)
RBC: 4.46 MIL/uL (ref 4.22–5.81)
RBC: 4.49 MIL/uL (ref 4.22–5.81)
RBC: 4.68 MIL/uL (ref 4.22–5.81)
RDW: 15.4 % (ref 11.5–15.5)
RDW: 15.4 % (ref 11.5–15.5)
WBC: 5.4 10*3/uL (ref 4.0–10.5)
WBC: 5.8 10*3/uL (ref 4.0–10.5)
WBC: 5.9 10*3/uL (ref 4.0–10.5)

## 2010-11-11 LAB — DIFFERENTIAL
Basophils Absolute: 0.1 10*3/uL (ref 0.0–0.1)
Basophils Relative: 1 % (ref 0–1)
Eosinophils Absolute: 0 10*3/uL (ref 0.0–0.7)
Eosinophils Relative: 1 % (ref 0–5)
Eosinophils Relative: 1 % (ref 0–5)
Lymphocytes Relative: 18 % (ref 12–46)
Lymphs Abs: 1 10*3/uL (ref 0.7–4.0)
Monocytes Absolute: 0.5 10*3/uL (ref 0.1–1.0)
Monocytes Absolute: 0.7 10*3/uL (ref 0.1–1.0)
Monocytes Relative: 10 % (ref 3–12)
Neutro Abs: 3.6 10*3/uL (ref 1.7–7.7)

## 2010-11-11 LAB — POCT CARDIAC MARKERS
CKMB, poc: <1 ng/mL — ABNORMAL LOW (ref 1.0–8.0)
CKMB, poc: <1 ng/mL — ABNORMAL LOW (ref 1.0–8.0)

## 2010-11-11 LAB — BASIC METABOLIC PANEL
CO2: 38 mEq/L — ABNORMAL HIGH (ref 19–32)
Calcium: 8.5 mg/dL (ref 8.4–10.5)
Chloride: 100 mEq/L (ref 96–112)
Chloride: 101 mEq/L (ref 96–112)
Creatinine, Ser: 0.6 mg/dL (ref 0.4–1.5)
GFR calc Af Amer: >60 mL/min (ref >60)
GFR calc non Af Amer: >60 mL/min (ref >60)
Glucose, Bld: 106 mg/dL — ABNORMAL HIGH (ref 70–99)
Glucose, Bld: 122 mg/dL — ABNORMAL HIGH (ref 70–99)
Potassium: 4.3 mEq/L (ref 3.5–5.1)
Sodium: 138 mEq/L (ref 135–145)
Sodium: 142 mEq/L (ref 135–145)

## 2010-11-11 LAB — URINALYSIS, ROUTINE W REFLEX MICROSCOPIC
Bilirubin Urine: NEGATIVE
Bilirubin Urine: NEGATIVE
Glucose, UA: NEGATIVE mg/dL
Glucose, UA: NEGATIVE mg/dL
Glucose, UA: NEGATIVE mg/dL
Glucose, UA: NEGATIVE mg/dL
Hgb urine dipstick: NEGATIVE
Ketones, ur: NEGATIVE mg/dL
Leukocytes, UA: NEGATIVE
Protein, ur: 100 mg/dL — AB
Specific Gravity, Urine: 1.007 (ref 1.005–1.030)
Specific Gravity, Urine: 1.011 (ref 1.005–1.030)
pH: 6.5 (ref 5.0–8.0)
pH: 5.5 (ref 5.0–8.0)
pH: 7 (ref 5.0–8.0)
pH: 7.5 (ref 5.0–8.0)

## 2010-11-11 LAB — CK TOTAL AND CKMB (NOT AT ARMC)
CK, MB: 2.3 ng/mL (ref 0.3–4.0)
CK, MB: 2.6 ng/mL (ref 0.3–4.0)
Relative Index: INVALID (ref 0.0–2.5)
Total CK: 36 U/L (ref 7–232)
Total CK: 69 U/L (ref 7–232)

## 2010-11-11 LAB — COMPREHENSIVE METABOLIC PANEL
ALT: 18 U/L (ref 0–53)
Albumin: 3 g/dL — ABNORMAL LOW (ref 3.5–5.2)
Alkaline Phosphatase: 41 U/L (ref 39–117)
BUN: 12 mg/dL (ref 6–23)
Chloride: 102 mEq/L (ref 96–112)
Glucose, Bld: 83 mg/dL (ref 70–99)
Potassium: 4.3 mEq/L (ref 3.5–5.1)
Sodium: 140 mEq/L (ref 135–145)
Total Bilirubin: 0.8 mg/dL (ref 0.3–1.2)
Total Protein: 5.6 g/dL — ABNORMAL LOW (ref 6.0–8.3)

## 2010-11-11 LAB — CARDIAC PANEL(CRET KIN+CKTOT+MB+TROPI)
CK, MB: 1.9 ng/mL (ref 0.3–4.0)
Total CK: 36 U/L (ref 7–232)

## 2010-11-11 LAB — HEPARIN LEVEL (UNFRACTIONATED): Heparin Unfractionated: 0.47 IU/mL (ref 0.30–0.70)

## 2010-11-11 LAB — PROTIME-INR
INR: 2.5 — ABNORMAL HIGH (ref 0.00–1.49)
Prothrombin Time: 14.1 seconds (ref 11.6–15.2)
Prothrombin Time: 14.4 seconds (ref 11.6–15.2)
Prothrombin Time: 20.4 seconds — ABNORMAL HIGH (ref 11.6–15.2)
Prothrombin Time: 29.1 seconds — ABNORMAL HIGH (ref 11.6–15.2)

## 2010-11-11 LAB — BLOOD GAS, ARTERIAL
Bicarbonate: 36.6 mEq/L — ABNORMAL HIGH (ref 20.0–24.0)
O2 Saturation: 88.7 %
Patient temperature: 98.6
pH, Arterial: 7.469 — ABNORMAL HIGH (ref 7.350–7.450)

## 2010-11-11 LAB — TROPONIN I
Troponin I: 0.02 ng/mL (ref 0.00–0.06)
Troponin I: <0.01 ng/mL (ref 0.00–0.06)

## 2010-11-11 LAB — URINE CULTURE
Culture: NO GROWTH
Special Requests: NEGATIVE

## 2010-11-11 LAB — URINE MICROSCOPIC-ADD ON

## 2010-11-11 LAB — LIPID PANEL
Cholesterol: 149 mg/dL (ref 0–200)
LDL Cholesterol: 100 mg/dL — ABNORMAL HIGH (ref 0–99)
Total CHOL/HDL Ratio: 4.3 RATIO
Triglycerides: 72 mg/dL (ref <150)

## 2010-11-11 LAB — TSH: TSH: 1.71 u[IU]/mL (ref 0.350–4.500)

## 2010-11-11 LAB — BRAIN NATRIURETIC PEPTIDE: Pro B Natriuretic peptide (BNP): 390 pg/mL — ABNORMAL HIGH (ref 0.0–100.0)

## 2010-11-11 LAB — T4, FREE
Free T4: 0.96 ng/dL (ref 0.89–1.80)
Free T4: 1.1 ng/dL (ref 0.89–1.80)

## 2010-11-11 LAB — MAGNESIUM: Magnesium: 1.6 mg/dL (ref 1.5–2.5)

## 2010-12-10 NOTE — Assessment & Plan Note (Signed)
[facility]                                 ON-CALL NOTE   KERON, NEENAN                          MRN:          [id number]  DATE:08/26/2008                            DOB:          1945-10-29    PRIMARY CARDIOLOGIST:  Rollene Rotunda, MD, [facility]   I received a call from [facility] with [facility] stating that she  was out to Mr. Korff' house to check his INR today.  Apparently, he was  discharged home yesterday, and when he was discharged, his INR was 2.5.  He is 3.7 today.  He was discharged on 5 mg nightly.  I have recommended  that he hold this Coumadin tonight as well as tomorrow night and that  they repeat an INR Monday.  We will determine his dose following that  recheck.     Nicolasa Ducking, ANP  Electronically Signed    CB/MedQ  DD: 08/26/2008  DT: 08/27/2008  Job #: [id number]

## 2010-12-10 NOTE — H&P (Signed)
Francisco Tanner, Francisco Tanner                 ACCOUNT NO.:  [id number]   MEDICAL RECORD NO.:  [id number]          PATIENT TYPE:  INP   LOCATION:  NA                           FACILITY:  [facility]   PHYSICIAN:  Ollen Gross, M.D.    DATE OF BIRTH:  Sep 26, 1945   DATE OF ADMISSION:  DATE OF DISCHARGE:                              HISTORY & PHYSICAL   CHIEF COMPLAINT:  Pain in left hip.   HISTORY OF PRESENT ILLNESS:  Francisco Tanner is a 65 year old male with left  hip pain.  The patient states that his left hip pain has been rapidly  worsening and is now preventing him from doing things he would like to  do. The patient's x-rays reveal significant arthritic changes in his  left hip also showing that he is essentially bone on bone in the left  hip joint.  The patient does have some spurs present at the joint  margins.  The patient now presents for left hip arthroplasty.   ALLERGIES:  No known drug allergies.   CURRENT MEDICATIONS:  Note, the patient does not have any of the dosages  of his medication with him, or very few of them and I explained that he  would need to bring these to the hospital.  1. Coumadin  2. Diltiazem 240 mg daily.  3. Aspirin 81 mg daily.  4. Protonix 40 mg daily.  5. Lasix 20 mg daily.  6. Vicodin 5/325 mg 1 tablet q.4-6h.  7. Symbicort 2 puffs daily.   CURRENT MEDICAL HISTORY:  1. COPD.  2. Pneumonia.  3. Congestive heart failure.  4. Atrial fibrillation.  5. Arthritis.   PRIMARY CARE PHYSICIAN:  Dr. Lysbeth Galas.   CARDIOLOGIST:  Dr. Antoine Poche.   PULMONOLOGIST:  Dr. Sherene Sires.   REVIEW OF SYSTEMS:  GENERAL: Positive for weight change.  The patient  states that he has gained weight recently but he is attributing this to  his hip pain.  Negative for night sweats, negative for fatigue.  HEENT:  NEURO: Negative for headache.  Negative for blurred vision.  Positive  for dentures.  The patient states he only has dentures for his top  teeth.  RESPIRATORY: Positive for occasional  shortness of breath with  exertion.  The patient states that this is well-controlled. Last chest x-  ray was February 16, 2009.  The patient states that this x-ray was normal.  CARDIOVASCULAR: Negative for chest pain.  Negative for palpitations.  Negative for difficulty with breathing when lying flat.  GASTROINTESTINAL: Negative for nausea, vomiting or diarrhea.  Negative  for blood in stool.  GENITOURINARY: Negative for painful urination.  Negative for frequent urinary tract infections.  MUSCULOSKELETAL:  Positive for joint pain, joint swelling. Positive for morning stiffness.  Negative for muscular weakness.   PAST SURGICAL HISTORY:  None.   FAMILY MEDICAL HISTORY:  Father passed at age 66. He was diabetic.  Mother unknown.   SOCIAL HISTORY:  The patient is married; however, he is separated.  The  patient is unemployed at this time. The patient says that he does drink  alcohol socially.  The patient states that he was a smoker for many  years but has since quit about 5 years ago.  The patient states that he  lives alone; however, he is planning to live at his brother's house  after surgery, so he does not need a skilled rehab facility.   PHYSICAL EXAMINATION:  VITAL SIGNS:  Pulse 78, respirations 16, blood  pressure 125/65.  GENERAL:  A 65 year old white male in no acute distress.  The patient is  alert and oriented x3 and he is a good historian.  NECK:  Supple.  Full range of motion.  Negative for lymphadenopathy.  CHEST/LUNGS:  Clear to auscultation bilaterally without wheezes, rhonchi  or rales.  HEART:  Regular rate and rhythm without murmur.  ABDOMEN:  Soft, nontender.  Bowel sounds are present.  EXTREMITIES:  Left hip: The patient is able to flex the left hip to  about 95 degrees.  There is almost no internal rotation and only about  10 degrees of external rotation and the patient is able to be 20-30  degrees of abduction.  NEUROLOGIC:  Sensation is intact bilaterally in lower  extremities.  PERIPHERAL VASCULAR:  Carotid pulses 2+ bilaterally without bruit.  Dorsalis pedis pulses 2+ bilaterally.  Radial pulses 2+ bilaterally.   IMPRESSION:  1. End-stage arthritis of the left hip.  2. Chronic obstructive pulmonary disease.  3. Congestive heart failure.  4. Atrial fibrillation.  Note: The patient has been cleared by his      primary care physician, Dr. Lysbeth Galas, as well as his cardiologist,      Dr. Antoine Poche, for surgery.  Dr. Antoine Poche noted in his clearance      that the patient is able to come off of his Coumadin 5 days prior      to surgery.   PLAN:  The patient will undergo all routine labs and tests prior to  having a left total hip arthroplasty by Dr. Sherlean Foot at [facility] on March 12, 2009.      [name], [facility]      Ollen Gross, M.D.  Electronically Signed    LD/MEDQ  D:  03/07/2009  T:  03/07/2009  Job:  [id number]

## 2010-12-10 NOTE — Discharge Summary (Signed)
Francisco Tanner, NOGA                 ACCOUNT NO.:  [id number]   MEDICAL RECORD NO.:  [id number]          PATIENT TYPE:  INP   LOCATION:  1607                         FACILITY:  [facility]   PHYSICIAN:  Ollen Gross, M.D.    DATE OF BIRTH:  08-19-1945   DATE OF ADMISSION:  03/12/2009  DATE OF DISCHARGE:  03/16/2009                               DISCHARGE SUMMARY   DISCHARGE DIAGNOSIS:  Osteoarthritis, left hip.   OTHER DIAGNOSES:  1. Chronic obstructive pulmonary disease.  2. Pneumonia.  3. Congestive heart failure.  4. Atrial fibrillation.  5. Arthritis.   PROCEDURES:  Left total hip arthroplasty by Dr. Lequita Halt on March 12, 2009.   CONSULTS:  None.   HOSPITAL COURSE:  Francisco Tanner is a 65 year old male admitted via the  operating room on March 12, 2009, at which time he underwent a left  total hip arthroplasty by Dr. Lequita Halt.  Patient tolerated the procedure  well, approximately 650 mL blood loss.  He was transferred to the PACU  and then the orthopedic floor postoperatively in stable condition.  He  had intact neurovascular function in the left lower extremity throughout  the entire hospital stay.  On postoperative day #1, his Hemovac drain  was discontinued.  He had a hemoglobin of 10.8, sodium 138, potassium  4.1, chloride 101, CO2 of 32, BUN 7, creatinine 0.66, and glucose of  115.  He had difficulty initially getting out of bed with physical  therapy.  By postoperative day #2, his hemoglobin was 10.7, INR 1.4, and  BMET stable.  He was on a Coumadin and Lovenox protocol given that he  was on Coumadin preoperative.  He did get out of bed and walk 17 feet on  postoperative day #2 but it was felt by physical therapy and  occupational therapy that he was not going to achieve his safety goals  and that skilled nursing would be an option.  On postoperative day #3,  patient had a hemoglobin 9.9, INR 1.7.  Slow improvement at that time.  He is tolerating a regular diet.  He was  requiring 4 L of oxygen for his  COPD.  He was able to ambulate 40 feet on postoperative day 3.  As of  postoperative day #4, he is in stable condition, tolerating a regular  diet, and has his INR of 2.0 thus the Lovenox is to be discontinued.  It  was discussed with the patient about disposition and we opted for a  skilled nursing facility if one was going to be available on March 16, 2009.  He is stable for discharge at this time.   DISCHARGE MEDICATIONS:  1. Percocet 5/325 one to 2 tablets every 4 to 6 hours as needed for      pain.  2. Robaxin 500 mg 1 tablet every 6 to 8 hours as needed for muscle      spasm.  3. Protonix 40 mg p.o. q.h.s.  4. Diltiazem 240 mg p.o. daily.  5. Lasix 20 mg p.o. daily.  6. Symbicort 2 sprays in each nostril every morning.  7. Aspirin 81 mg daily.  8. In addition, he will be on Coumadin.  He was on Coumadin      preoperative at a dose of 5 mg alternating with 2.5 mg, however, at      the time of discharge his Coumadin doses were as follows:  On      March 15, 2009, 5 mg, March 14, 2009, 5 mg, March 13, 2009, 5      mg, March 12, 2009, 7.5 mg.  He will be on Coumadin to titrate his      INR between 2 and 3.  I anticipate he will need 5 mg on March 16, 2009, and then adjust to keep him within that 2 to 3 range.   ACTIVITY:  He will be partial weightbearing, 25% to 50%, on his left  lower extremity.  Patient may shower with the incision uncovered and  then a dry dressing should be placed on the incision after the shower.   CONDITION:  He is stable, tolerating a regular diet, in good condition.   FOLLOWUP:  He will be seen in Dr. Deri Fuelling office 2 weeks  postoperatively.  Please call [phone] for an appointment.      Ollen Gross, M.D.  Electronically Signed     FA/MEDQ  D:  03/16/2009  T:  03/16/2009  Job:  [id number]

## 2010-12-10 NOTE — Op Note (Signed)
Francisco Tanner, Francisco Tanner                 ACCOUNT NO.:  [id number]   MEDICAL RECORD NO.:  [id number]          PATIENT TYPE:  INP   LOCATION:  0012                         FACILITY:  [facility]   PHYSICIAN:  Ollen Gross, M.D.    DATE OF BIRTH:  10-Jun-1946   DATE OF PROCEDURE:  03/12/2009  DATE OF DISCHARGE:                               OPERATIVE REPORT   PREOPERATIVE DIAGNOSIS:  Osteoarthritis, left hip.   POSTOPERATIVE DIAGNOSIS:  Osteoarthritis, left hip.   PROCEDURE:  Left total hip arthroplasty.   SURGEON:  Ollen Gross, MD.   ASSISTANT:  [name], PA-C.   ANESTHESIA:  General.   ESTIMATED BLOOD LOSS:  650.   DRAIN:  Hemovac x1.   COMPLICATIONS:  None.   CONDITION:  Stable to recovery room.   BRIEF CLINICAL NOTE:  Francisco Tanner is a 65 year old male with end-stage  arthritis rapidly progressive in nature of his left hip.  He has had  progressively worsening pain and dysfunction.  He presents now for left  total hip arthroplasty.   PROCEDURE IN DETAIL:  After successful administration of general  anesthetic, the patient is placed in the right lateral decubitus  position with the left side up and held with the hip positioner.  Left  lower extremity is isolated from his perineum with plastic drapes and  prepped and draped in a usual sterile fashion.  Short posterolateral  incision is made with a 10-blade through subcutaneous tissue to the  level of the fascia lata, which is incised in line with the skin  incision.  Sciatic nerve is palpated and protected and a short external  rotator is isolated off the femur.  Capsulotomy is performed and the hip  is dislocated.  Center of femoral head is marked and a trial prosthesis  is placed such that the center of the trial head corresponds to the  center of his native femoral head.  Osteotomy is marked on the femoral  neck and osteotomy made with an oscillating saw.  Femoral head is  removed and the femur is retracted anteriorly to gain  acetabular  exposure.   Acetabular retractors were placed and labrum and osteophytes removed.  Acetabular reaming starts at 47 mm coursing increments of 2 to 55 mm  then a 56 mm pinnacle acetabular shell is placed in anatomic position  and transfixed with 2 dome screws.  The apex hole eliminator is placed  in the knee, a 40 mm neutral Ultamet metal liner is placed for a metal-  on-metal hip replacement.   The femur is then prepared with the canal finder and irrigation.  Axial  reaming is performed to 15.5 mm, proximal reaming to a 21F, and the  sleeve machined to a large.  A 21F large trial sleeve is placed with a  20 x 15 stem and 36+8 neck matching native anteversion.  The trial 36.0  head is placed.  The hip reduces easily so we went to a 36+3, which had  more appropriate soft tissue tension.  The stability is excellent in  full extension, full external rotation at 70 degrees flexion,  40 degrees  adduction, 90 degrees internal rotation, and 90 degrees of flexion and  70 degrees of internal rotation.  By placing the left leg on top of the  right, I felt as though the leg lengths were equal.  The hip is then  dislocated and all trials are removed.  The permanent 69F large sleeve  is placed with a 20 x 15 stem, 36+8 neck, matching native anteversion.  The 40+3 head is placed and the hip is reduced to the same stability  parameters.  The wound is copiously irrigated with saline solution and  the capsule and short rotators then reattached to the femur through  drill holes with Ethibond suture.  Fascia lata is closed over a Hemovac  drain with interrupted #1 Vicryl.  Then subcu closed with interrupted #1-  0 and #2-0 Vicryl and subcuticular running 4-0 Monocryl.  Drain is  hooked to suction, incision cleaned and dried, and Steri-Strips and a  bulky sterile dressing are applied.  He is then placed into a knee  immobilizer, awakened, and transported to recovery in stable condition.       Ollen Gross, M.D.  Electronically Signed     FA/MEDQ  D:  03/12/2009  T:  03/12/2009  Job:  [id number]

## 2010-12-10 NOTE — Assessment & Plan Note (Signed)
[facility]                            CARDIOLOGY OFFICE NOTE   MADDOCK, FINIGAN                          MRN:          [id number]  DATE:02/21/2009                            DOB:          [birth year]    REFERRING PHYSICIAN:  Ollen Gross, M.D.   PRIMARY CARE DOCTOR:  Delaney Meigs, MD   REASON FOR PRESENTATION:  Evaluate the patient with arrhythmias,  nonobstructive coronary artery disease, and an abnormal EKG.  He is to  have hip surgery.   HISTORY OF PRESENT ILLNESS:  The patient is 65 years old.  I last saw  him in February after hospitalization for treatment of atrial flutter.  He had TEE-guided cardioversion.  Since that time, he has had no new  palpitations.  He has had no presyncope or syncope.  He has had no new  chest discomfort, neck, or arm discomfort.  He is limited in his  functional activity, however, by hip pain.  He is apparently to have hip  replacement by Dr. Lequita Halt.  He is referred here preoperatively.  Of  note, the patient has significant hypertension today.  He said he did  run out of his blood pressure medicines for 2 days.   PAST MEDICAL HISTORY:  coronary artery disease (nonobstructive by  catheterization in 2005), peripheral vascular disease (chronically-  occluded right common iliac, mild right ventricular dilatation with mild  pulmonary hypertension noted on echo, COPD, and pulmonary lung nodule  followed by Dr. Sherene Sires felt to be scarring, atrial flutter, status post  TEE-guided cardioversion, and degenerative joint disease.   ALLERGIES:  None.   MEDICATIONS:  1. Coumadin  2. Protonix 40 mg daily.  3. Symbicort.  4. Cardizem 240 mg daily.  5. Aspirin 81 mg daily.  6. Lasix 20 mg daily.   REVIEW OF SYSTEMS:  As stated in the HPI, otherwise negative for all  other systems.   PHYSICAL EXAMINATION:  GENERAL:  The patient is pleasant and in no  distress.  VITAL SIGNS:  Blood pressure 188/112 and heart rate is  83 and regular.  HEENT:  Eyes are unremarkable; pupils equal, round, and reactive to  light; fundi not visualized; oral mucosa remarkable.  NECK:  No jugular venous distention at 45 degrees, carotid upstroke  brisk and symmetric; no bruits, no thyromegaly.  LYMPHATICS:  No cervical, axillary, or inguinal adenopathy.  LUNGS:  Clear to auscultation bilaterally.  BACK:  No costovertebral angle tenderness.  CHEST:  Unremarkable.  HEART:  PMI not displaced or sustained; S1 and S2 within normal limits;  no S3, no S4; no clicks, no rubs, no murmurs.  ABDOMEN:  Obese; positive  bowel sounds; normal in frequency and pitch; no bruits, no rebound, no  guarding; no midline pulsatile mass, no hepatomegaly, no splenomegaly.  SKIN:  No rashes, no nodules.  EXTREMITIES:  2+ pulses; no edema, no cyanosis, no clubbing.  NEUROLOGIC:  Oriented to person, place, and time; cranial nerves II-XII  grossly intact; motor grossly intact.   EKG:  Sinus rhythm, rate 83, right bundle-branch block, possible right  atrial  enlargement, anterolateral ST depression, questionable repole  versus ischemia.   ASSESSMENT AND PLAN:  1. Preoperative evaluation.  The patient had nonobstructive disease in      the past, but it has been 5 years since catheterization.  He has      peripheral vascular disease.  He has a markedly abnormal EKG, which      is progression from an incomplete right bundle-branch block in the      past.  He has low functional level.  He is going for a surgery that      is at least moderate risk from a cardiovascular standpoint.  Given      all of this, according to the ACC/AHI guidelines, the patient needs      to be evaluated with stress test prior to surgery.  He could not      ambulate on the treadmill, so he will have an adenosine Cardiolite.  2. Hypertension.  Blood pressure was elevated today.  I kept him in      the office giving him clonidine.  He was at 170/100 by the time, he      left.  He  is instructed to restart his medications and he does have      these prescriptions.  He said when it has been checked otherwise it      has been well-controlled and he needs to keep an eye on this.  3. Atrial flutter.  He has had no recurrence of this.  No further      evaluation is planned.  He certainly could come off his Coumadin 5      days prior to the surgery.  4. Obesity.  He understands the need to lose weight with diet and      exercise.  5. Followup.  I would be happy to see him at the time of surgery if      necessary.  Otherwise, I will see him back in about 6 months or      sooner if needed.     Rollene Rotunda, MD, [facility]  Electronically Signed    JH/MedQ  DD: 02/21/2009  DT: 02/22/2009  Job #: [id number]   cc:   Ollen Gross, M.D.  Delaney Meigs, M.D.

## 2010-12-10 NOTE — Assessment & Plan Note (Signed)
[facility]                            CARDIOLOGY OFFICE NOTE   Francisco Tanner, Francisco Tanner                          MRN:          [id number]  DATE:09/15/2008                            DOB:          02-12-1946    REASON FOR PRESENTATION:  Evaluate the patient with recent cardioversion  for treatment of atrial flutter.   HISTORY OF PRESENT ILLNESS:  The patient was hospitalized in January.  He had rapid atrial flutter requiring cardioversion.  He has been  maintained on Coumadin since then.  The cardioversion was done by Dr.  Eden Emms.  His TEE-guided cardioversion.  His EF was well preserved.  He  does have some mild RV dysfunction and mild pulmonary hypertension.  He  is seeing Dr. Sherene Sires for management of chronic lung disease listed as  COPD.  He occasionally wears oxygen.  We have seen him in the past and  he has had a cardiac catheterization and known peripheral vascular  disease.   Since going home, he has had no new palpitations.  He has had no  presyncope or syncope.  He has his chronic dyspnea.  This is unchanged.  He has had no now new chest discomfort, neck, or arm discomfort.  He is  maintaining his Coumadin.  He has the meds as listed.   PAST MEDICAL HISTORY:  1. Nonobstructive coronary artery disease (catheterization February 12, 2004).  2. Peripheral vascular disease (chronically occluded right common      iliac).  3. Mild RV dilatation.  4. COPD.  5. Pulmonary lung nodule being followed by Dr. Sherene Sires.  6. Atrial flutter as described.   ALLERGIES AND INTOLERANCES:  None.   MEDICATIONS:  1. Spiriva.  2. Diltiazem 240 mg daily.  3. Coumadin.  4. Aspirin 81 mg daily.  5. Pantoprazole 40 mg daily.   REVIEW OF SYSTEMS:  As stated in the HPI and otherwise negative for  other systems.  He does have some right leg claudication and joint  pains.   PHYSICAL EXAMINATION:  GENERAL:  The patient is in no distress.  VITAL SIGNS:  Blood pressure  128/78, heart rate 80 and regular, and  weight 213 pounds.  HEENT:  Eyelids unremarkable.  Pupils equal, round, reactive to light.  Fundi not visualized.  Oral mucosa unremarkable.  NECK:  No jugular venous distention at 45 degrees.  Carotid upstroke  brisk and symmetric.  No bruits.  No thyromegaly.  LYMPHATICS:  No adenopathy.  LUNGS:  Clear to auscultation bilaterally.  BACK:  No costovertebral angle tenderness.  CHEST:  Unremarkable.  HEART:  Distant heart sounds, S1 and S2 within normal limits.  No S3, no  S4.  No clicks, no rubs, no obvious murmurs.  ABDOMEN:  Obese, positive bowel sounds, normal in frequency and pitch.  No bruits, no rebound, no guarding.  No midline pulsatile mass.  No  organomegaly.  SKIN:  No rashes, no nodules.  EXTREMITIES:  Upper pulses 2+, absent right femoral, 1+ left femoral, no  bruits, absent bilateral popliteals, absent dorsalis pedis and posterior  tibialis bilaterally.  Trace bilateral lower extremity edema.  NEUROLOGIC:  Oriented to person, place, and time.  Cranial nerves II-XII  grossly intact.  Motor grossly intact.   EKG, sinus rhythm, rate 80, right axis deviation, incomplete right  bundle-branch block, nonspecific anterior T-wave inversion, QTc  prolonged.   ASSESSMENT AND PLAN:  1. Atrial flutter.  The patient is still maintaining sinus rhythm.  He      is on Coumadin.  He will continue Cardizem.  He will let us know if      he has any palpitations or profound weakness.  He knows to present      to the emergency room with any severe symptoms.  We discussed the      possible recurrence of this arrhythmia.  2. Peripheral vascular disease.  The patient has known peripheral      vascular disease as listed.  He needs aggressive primary risk      reduction.  He has no new symptoms and is being managed medically      for this.  3. Risk reduction.  I would suggest that the patient have a fasting      lipid profile with an aggressive goal LDL  less than 100 and HDL      greater than 50.  I will defer to Dr. Lysbeth Galas.  4. Lung nodule per Dr. Sherene Sires.  5. Chronic obstructive pulmonary disease per Dr. Sherene Sires.  6. Followup.  I will see him back in [place] in about 6 months or      sooner if needed.     Rollene Rotunda, MD, [facility]  Electronically Signed    JH/MedQ  DD: 09/15/2008  DT: 09/16/2008  Job #: [id number]   cc:   Delaney Meigs, M.D.

## 2010-12-10 NOTE — Consult Note (Signed)
NAMEVIOLET, CART                 ACCOUNT NO.:  [id number]   MEDICAL RECORD NO.:  [id number]          PATIENT TYPE:  AMB   LOCATION:  ENDO                         FACILITY:  [facility]   PHYSICIAN:  Peter C. Eden Emms, MD, FACCDATE OF BIRTH:  1945-08-17   DATE OF CONSULTATION:  DATE OF DISCHARGE:                                 CONSULTATION   Transesophageal echocardiogram with cardioversion.   INDICATIONS:  A 65 year old patient with respiratory failure, lung  disease, and rapid atrial flutter.  The patient was on therapeutic  heparin at the time of the procedure.   He was sedated with 50 mcg of fentanyl and 6 mg of Versed.  Using  digital technique, an Omniplane probe was advanced to the esophagus  without incident.  Ventricular cavity size and function were normal.  EF  was 60%.  Mitral valve was mildly thickened with mild MR.  There was  mild left atrial enlargement.  Right-sided cardiac chambers were normal.  There is no evidence of cor pulmonale.  Aortic valve was trileaflet and  structurally normal.  There was no ASD.  Imaging of the left atrial  appendage showed no spontaneous contrast and no thrombus.   Imaging of the aortic root showed no debris.   The patient was then anesthetized with 100 mg of sodium Pentothal.   A single biphasic 200-joule shock was delivered.  The patient converted  from atrial flutter at a rate of 90 to normal sinus rhythm at a rate of  70.   IMPRESSION:  Successful TEE-guided cardioversion.  The patient will  remain on heparin until his INR is therapeutic at over 2.2.  He  tolerated the procedure well and will be transferred back to see [facility].      [name]. Eden Emms, MD, [facility]  Electronically Signed     PCN/MEDQ  D:  08/23/2008  T:  08/24/2008  Job:  [id number]

## 2010-12-10 NOTE — H&P (Signed)
NAMEVIOLA, Francisco Tanner NO.:  [id number]   MEDICAL RECORD NO.:  [id number]          PATIENT TYPE:  INP   LOCATION:  1824                         FACILITY:  [facility]   PHYSICIAN:  Vania Rea, M.D. DATE OF BIRTH:  1945/08/14   DATE OF ADMISSION:  08/16/2008  DATE OF DISCHARGE:                              HISTORY & PHYSICAL   PRIMARY CARE PHYSICIAN:  Delaney Meigs, M.D.   CHIEF COMPLAINT:  Shortness of breath getting progressively worse.   HISTORY OF THE PRESENT ILLNESS:  This is a 65 year old Caucasian  gentleman with a history of pulmonary hypertension who has been seeing a  pulmonologist on and off for the past 5 years.  He does not feel he has  been getting any significant benefit, but would appear he has not been  following up regularly with his primary care physician.  The patient  does have a history of cardiac catheterization in 2005 by [place] at  which time mild pulmonary hypertension was mentioned, but no significant  coronary artery disease was detected.  The patient reports that he has  been having progressive shortness of breath for many years, worse since  November after he got an H1N1 shot.  Since then the patient states he  has also noted progressive swelling of his legs and severe dyspnea on  exertion.  The patient reports he has had gradually worsening dyspnea on  exertion and orthopnea, but this again seems to have been getting worse  since November.  The patient now states just bending over causes severe  shortness of breath and walking a few steps causes severe shortness of  breath.  Of note, the patient reports shortness of breath not only with  lying flat, but also with bending forward.  Shortness of breath is  associated also with palpitations, but no chest pains, no diaphoresis  and no nausea.   The patient has been having a cough productive of, initially, of pink  sputum and now clear sputum.  Again, he denies fever.  The patient  also  reports GERD-like symptoms for many years, especially at night and if he  drinks water it tends to reflux up into his throat and causes a lot of  coughing.   PAST MEDICAL HISTORY:  1. Pneumonia.  2. Pulmonary hypertension.  3. Remote history of partial lung collapse treated with oxygen.  4. Remote history of left hip surgery due to fall and hematoma      accumulation.  5. History of removal of bone spur from ankle in 1969.   MEDICATIONS:  None.   ALLERGIES:  None.   SOCIAL HISTORY:  The patient discontinued tobacco use in 2004 after  about 35 years use.  He drinks about a six-pack of beer per week.  He  denies illicit drug use.  He is a retired Community education officer.   FAMILY HISTORY:  The family history is significant for father with  diabetes and a brother with a history of collapse of the lung due to  congenital reasons.   REVIEW OF SYSTEMS:  The review of systems other  than that noted above is  significant only for blurring of his vision seeing stars when he becomes  extremely short of breath.  Sore throat associated with GERD-like  symptoms.  Palpitations associated with his shortness of breath.  Constipation.  He used to have a history of tightness in his left leg  when walking a certain distance.  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   PHYSICAL EXAMINATION:  GENERAL APPEARANCE:  The patient is an ill-  looking middle-aged, mildly obese Caucasian gentleman sitting up on the  stretcher in moderate-to-severe respiratory distress.  VITAL SIGNS:  His temperature is 98.2, pulse 120, respiration 22 and  blood pressure 152/82.  He is saturating at 96% on 2 liters; was  reportedly at 70% on room air.  HEENT:  Pupils are round and equal.  Mucous membranes are pink.  Anicteric.  NECK:  No cervical lymphadenopathy or thyromegaly detected.  No carotid  bruit.  CHEST:  The patient has a  relatively silent chest with occasional  rhonchi.  No crackles heard.  The patient is unable to tolerate leaning  forward to allow me to percuss his chest.  HEART:  Cardiovascular system irregular tachycardiac.  No murmur heard.  ABDOMEN:  The patient's abdomen is soft and obese with no flank dullness  detected.  EXTREMITIES:  In the extremities he has 3+ edema on the left and 2+  edema on the right.  His toes are warm.  Dorsalis pedis pulses are 1+.  NEUROLOGIC EXAMINATION:  Central nervous system: Cranial nerves II-XII  are grossly intact.  He has no focal neurologic deficit.   LABORATORY DATA:  CBC is unremarkable; his white count is 5.4,  hemoglobin 15.3, MCV 98, and platelets 161,000 with a fairly normal  differential.  Serum chemistries, again, unremarkable; his sodium is  140, potassium 4.3, chloride 102, CO2 26, glucose 83, BUN 12, and  creatinine 0.6.  Total protein 5.6 and albumin 3.0.  Calcium 8.1.  Liver  function tests are unremarkable.  Chest x-ray shows cardiac enlargement  and COPD, and an abnormal density in the right midlung which could be an  area of infiltrate or atelectasis, or possibly a mass lesion.  There is  no heart failure.  CT of the chest with contrast was suggested for  further evaluation.  Urinalysis was unremarkable.  EKG  shows atrial  tachycardia of unclear etiology, question atrial flutter, incomplete  right bundle branch block, right ventricular hypertrophy, and  questionable septal infarct.   ASSESSMENT:  1. Acute respiratory failure likely acute on chronic.  2. Pulmonary hypertension.  3. Right heart failure.  4. Supraventricular tachycardia.  5. Questionable right lung mass.  6. Probably gastroesophageal reflux disease.  7. History of peripheral vascular disease.   PLAN:  1. We will start this gentleman on Cardizem to control his heart rate.  2. We will get a CT scan of his chest to further evaluate the lung      mass.  3. The patient will  likely need a consult with a cardiologist as well      as a pulmonologist for assistance with      management.  4. We will also for cycle of his cardiac enzymes, but will hold Lasix      for the time being.  5. Other plans as per orders.      Vania Rea, M.D.  Electronically Signed  LC/MEDQ  D:  08/16/2008  T:  08/17/2008  Job:  [id number]   cc:   Delaney Meigs, M.D.

## 2010-12-10 NOTE — Discharge Summary (Signed)
Francisco Tanner, MIAN                 ACCOUNT NO.:  [id number]   MEDICAL RECORD NO.:  [id number]          PATIENT TYPE:  INP   LOCATION:  1607                         FACILITY:  [facility]   PHYSICIAN:  Ollen Gross, M.D.    DATE OF BIRTH:  [birth year]   DATE OF ADMISSION:  03/12/2009  DATE OF DISCHARGE:                               DISCHARGE SUMMARY   ADDENDUM   NEW TENTATIVE DATE OF DISCHARGE:  March 19, 2009.   Admitting and discharge diagnoses, please see previous discharge  summary.   PROCEDURE:  See previously discharged summary.   ADDENDUM HOSPITAL COURSE:  The patient has a possibility of being  discharged on August 23.  Have had a bed available, and they were  awaiting insurance company's approval, so he stayed through the weekend  at the hospital receiving therapy each day.  He was getting up and  slowly progressing with his therapy, staying on Coumadin, and his INRs  being therapeutic, daily dressing change.  He had a little bit of serous  drainage from where the Hemovac drain site was.  Otherwise, the incision  was healing well.  He received therapy each day.  He was seen back on  Monday.  Reviewing the chart, it looked like he had chosen a bed at  [facility], but they were awaiting on final insurance  approval.  Seen Monday morning.  The patient is doing well, no  complaints, just slowly progressing with his therapy and his walking.  He was maintaining his 25-50% weightbearing.  We were awaiting approval  from the insurance company.  Would have the social work and discharge  planner check on that today.  If insurance is approved, he would be  transferred over to [facility] [place].   DISCHARGE PLAN:  1. Tentative discharge, today's date of March 19, 2009.  2. Discharge diagnoses, please see above.  3. Discharge meds, please see previous medications in the main      discharge summary.  4. Activity and follow-up, please see previous discharge summary.   CONDITION ON DISCHARGE:  Stable.   DISPOSITION:  Possible [facility] [place], awaiting insurance  approval.     Alexzandrew L. Perkins, P.A.C.      Ollen Gross, M.D.  Electronically Signed   ALP/MEDQ  D:  03/19/2009  T:  03/19/2009  Job:  [id number]

## 2010-12-10 NOTE — Consult Note (Signed)
Francisco Tanner, MALMSTROM NO.:  [id number]   MEDICAL RECORD NO.:  [id number]          PATIENT TYPE:  INP   LOCATION:  1824                         FACILITY:  [facility]   PHYSICIAN:  Unice Cobble, MD     DATE OF BIRTH:  1945/10/03   DATE OF CONSULTATION:  08/16/2008  DATE OF DISCHARGE:                                 CONSULTATION   CHIEF COMPLAINT:  Shortness of breath.   HISTORY OF PRESENT ILLNESS:  This is a 65 year old white male with  shortness of breath since November.  Patient received an H1N1 vaccine in  November that was followed by an illness with cough and shortness of  breath as a component.  At the time of his illness, he had chest pain  that lasted 24 hours a day.  The chest pain has since subsided, but he  has been short of breath ever since with any exertion.  He has also  developed orthopnea, PND, and lower extremity edema.  The lower  extremity edema has just popped up in the last week and a half.  He came  to the emergency department today due to his worsening shortness of  breath.  He denies chest pain, heart racing, nausea, vomiting,  presyncope, or syncope.   PAST MEDICAL HISTORY:  Pulmonary hypertension with a catheterization on  February 12, 2004 that showed minimal coronary disease and a pulmonary  artery mean pressure of 31 mmHg.   ALLERGIES:  No known drug allergies   MEDICATIONS:  None.   SOCIAL HISTORY:  He lives in [place] alone.  He is an Tax inspector.  He  quit smoking in 2004.  He drinks a 6-pack of beer per week.  No drugs.   FAMILY HISTORY:  His mother's health history he is unsure about.  His  father had diabetes and died at age 12 of a stroke.   REVIEW OF SYSTEMS:  He has had a weight gain.  He has had blurred vision  when he becomes severely short of breath.  Otherwise, a complete review  of systems was done and found to be negative except as stated in the  HPI.   PHYSICAL EXAMINATION:  Temperature 99.4 max with a temperature  current  of 98.2.  His pulse is 107.  Respiratory rate 22.  Blood pressure  148/98.  O2 sats are 97% on 2 liters.  GENERAL:  He is mildly short of breath, overweight.  Otherwise in no  acute distress.  HEENT:  PERRLA.  EOMI.  MMM.  Oropharynx without erythema or exudates.  NECK:  Supple without lymphadenopathy, thyromegaly, or bruits.  He has  jugular venous distention to his jaw and positive hepatojugular reflux.  HEART:  Regular rate and rhythm with a normal S1 and S2.  There are no  murmurs, rubs or gallops.  His pulses are 2+ and equal bilaterally  without bruits.  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  LUNGS:  Diffuse wheezing bilaterally with decreased breath sounds at the  bases.  ABDOMEN:  Soft and nontender with normal bowel sounds.  No  rebound or  guarding.  EXTREMITIES:  No clubbing or cyanosis.  Edema 2+ in the left lower  extremity and 1+ edema in the right lower extremity.  MUSCULOSKELETAL:  No joint deformity or effusions.  No spinal or CVA  tenderness.  NEUROLOGIC:  He is alert and oriented x3 with cranial nerves II-XII  grossly intact.  Strength is 5/5 in all extremities, in axial groups  with normal sensation throughout.   RADIOLOGY:  He had a chest x-ray with cardiomegaly and COPD changes as  well as a right lung density.   EKG showed a rate of 110 and atrial flutter.  He has nonspecific T wave  changes.  Low voltage.  Nonspecific QRS widening.   His labs for the most part are normal with a creatinine of 0.06, BNP  390, and normal troponin and CK-MB.   ASSESSMENT/PLAN:  This is a 64 year old white male with a history of  mild pulmonary hypertension who presents with new-onset congestive heart  failure in the setting of atrial flutter and likely pericardial  effusion.  1. Congestive heart failure:  He has increased BNP and signs and      symptoms of congestive heart failure.  This may be secondary to his      tachycardic arrhythmia, myocarditis, or pericardial  effusion.      Coronary artery disease is very low probability.  I recommend      diuresing him tonight and getting an echocardiogram in the morning.  2. Atrial flutter:  The patient needs TEE and DC cardioversion.  He      should be rate-controlled until then with a Diltiazem drip with a      goal of a heart rate of less than 100, blood pressure permitting.      Anticoagulation would be required prior to any cardioversion with a      heparin drip.  If the CT scan does not show anything concerning      regarding anticoagulation, then this should be done tonight.  TSH      will need to be checked.  3. Pericardial effusion:  He has a pulsus paradoxus, which is      indicative of constrictive physiology/early tamponade.  As the      patient's vital signs are stable otherwise, there is no urgent need      for pericardiocentesis tonight.  If there is a malignancy in the      lung on CT, then bloody effusion may be present, and      anticoagulation should be withheld instead of given.  He has no      chest pain; therefore, aspirin and colchicine are unnecessary at      this time.  4. Lipids will need to be checked in the morning.      Unice Cobble, MD  Electronically Signed     ACJ/MEDQ  D:  08/16/2008  T:  08/17/2008  Job:  [id number]

## 2010-12-10 NOTE — Discharge Summary (Signed)
Francisco Tanner, Francisco Tanner NO.:  [id number]   MEDICAL RECORD NO.:  [id number]          PATIENT TYPE:  INP   LOCATION:  1411                         FACILITY[facility]  [facility]   PHYSICIAN:  Altha Harm, MDDATE OF BIRTH:  [birth year]   DATE OF ADMISSION:  08/17/2008  DATE OF DISCHARGE:  08/25/2008                               DISCHARGE SUMMARY   DISCHARGE DISPOSITION:  Home.   FINAL DISCHARGE DIAGNOSES:  Please refer to the interim discharge  summary on January 26 for details of discharge diagnoses.  Please note  that the patient did not have a urinary tract infection.   DISCHARGE MEDICATIONS INCLUDE THE FOLLOWING:  1. Cardizem CD 240 mg p.o. daily.  2. Aspirin 81 mg p.o. daily.  3. Spiriva HandiHaler 18 mcg inhaled daily.  4. Symbicort 160/4.5 one puff p.o. b.i.d.  5. Protonix 40 mg p.o. daily.  6. Coumadin 5 mg p.o. to be taken tonight and then further titration      of Coumadin to be done by [facility] in [place] for follow-up      INRs.   PROCEDURES:  The patient had a trans esophageal cardioversion from A-  flutter to sinus rhythm done on August 23, 2008.   DIAGNOSTIC STUDIES:  Renal ultrasound done on January 26 which showed  unremarkable renal ultrasound examination.   Please refer to the interim discharge summary on the 27th for complete  details of presentation, HPI and hospital course to that point.   HOSPITAL COURSE:  From the 27th to the 29th, the patient had TEE-guided  cardioversion which was successful with the first attempt done on  January 27.  The patient was continued on his Cardizem with good rate  control of his atrial flutter.  The patient had been on short-acting  Cardizem which has been converted over to long-acting Cardizem at 240 mg  p.o. daily.  The patient was essentially waiting for his INR to become  therapeutic.  It is now therapeutic at 2.5.  The patient is being  discharged home on Coumadin.  He is to take 5 mg tonight.  He will  have  his INR checked tomorrow at the [facility] and a titration will be done  based upon that.  The goal of the INR is to remain over 2.2 to 3.   DIETARY RESTRICTIONS:  The patient should be on a vitamin K stable diet.   PHYSICAL RESTRICTIONS:  None.   FOLLOWUP:  The patient is to follow up with Dr. Lysbeth Galas, his primary care  physician, in 1 week, particularly to address the issue of the lung  nodule and how it should be followed up as an outpatient.  Please see  the interim discharge summary for details on that.  The patient is to  follow up with Dr. Sherene Sires at the [facility] for his  pulmonary hypertension.  Phone number to their clinic is [phone].  He  is to follow up with Dr. Antoine Poche in 2 to 3 weeks.  Phone number to Dr.  Jenene Slicker office is [phone].  However, for Dr. Eden Emms,  the office will  call [name] to arrange for his follow-up visit in 2 to 3 weeks.  The  number to the [facility] in [place] is [phone] and the patient is to  show up tomorrow to have his INR checked and titrated.   Total time for this discharge 57 minutes.      Altha Harm, MD  Electronically Signed     MAM/MEDQ  D:  08/25/2008  T:  08/25/2008  Job:  [id number]   cc:   Delaney Meigs, M.D.  Fax: [phone]   [name]. Eden Emms, MD, [facility]  [address]  [address]  [place]  [place] [address]   Rollene Rotunda, MD, [facility]  [address]  [address]  [place]  [place] [address]   Charlaine Dalton. Sherene Sires, MD, [facility]  [address]  [place] [address]

## 2010-12-10 NOTE — Group Therapy Note (Signed)
NAMEJESUSMANUEL, Francisco Tanner NO.:  [id number]   MEDICAL RECORD NO.:  [id number]          PATIENT TYPE:  INP   LOCATION:  1411                         [facility]   PHYSICIAN:  Altha Harm, MDDATE OF BIRTH:  01-Jan-1946                                 PROGRESS NOTE   DISCHARGE DIAGNOSES:  1. Atrial flutter.  2. Lung nodule.  3. Hematuria.  4. Possible urinary tract infection.  5. History of pulmonary hypertension.  6. Cor pulmonale with right-sided systolic dysfunction.   DISCHARGE MEDICATIONS:  Will be determined at the time of discharge.   CONSULTANTS:  1. Dr. Graciela Husbands and company, electrophysiology.  2. Dr. Delton Coombes, pulmonology.  3. Interventional radiology.   PROCEDURES:  None.   DIAGNOSTIC STUDIES:  1. CT, chest, portable view done on admission which shows abnormal      density in the right mid lung which could be an area of infiltrate      or atelectasis or possibly a mass lesion.  2. CT, chest, with contrast done on August 16, 2008, which showed      mass-like pleura or parenchymal thickening along the right      horizontal fissure.  This has an appearance of chronic inflammatory      process, however, cannot rule out a low-grade adenocarcinoma which      has a similar appearance.  Recommend followup CT in 3 to 6 months      to evaluate stability.  Alternatively, a percutaneous biopsy may be      warranted.  Consider thoracic surgery consult.  Impression:  An 11-      mm subpleural nodule over the right diaphragm.  Recommend attention      on followup additionally.  Impression:  Outside radiographs and CT      may be of value when establishing chronicity.  3. Renal ultrasound done on the August 22, 2008, which shows an      unremarkable renal ultrasound.   PRIMARY CARE PHYSICIAN:  Dr. Lysbeth Galas.   CODE STATUS:  Full code.   ALLERGIES:  NO KNOWN DRUG ALLERGIES.   CHIEF COMPLAINT:  Shortness of breath getting progressively worse.   HISTORY OF PRESENT ILLNESS:  Please refer to the H and P dictated by Dr. Orvan Falconer on August 16, 2008, for details of the HPI.   HOSPITAL COURSE:  1. Shortness of breath.  The patient was found to be in atrial flutter      and it was felt that his shortness of breath was multifactorial      owing to his history of pulmonary hypertension, resultant cor      pulmonale, and effects of the atrial flutter.  The patient was      given supplemental oxygen and started on diuretics which improved      his shortness of breath but did not resolve it completely.  2. Atrial flutter.  In regard to his atrial flutter, the patient has      been started on IV heparin and Coumadin.  The patient will have      transesophageal  cardioversion scheduled for tomorrow, August 23, 2008, to be done by Dr. Eden Emms.  3. Pulmonary lung nodule.  The patient was found to have a solitary      pulmonary nodule and radiologic impressions as above.      Interventional radiology was consulted for a biopsy, however, they      felt that performing a radiologic-guided biopsy on this lung nodule      was too risky and recommended that if in fact a biopsy was to be      sought after the PET scan be done to evaluate for other hot nodules      and decisions made after that.  These options would include the      following:  Biopsy of a more accessible lesion dependent upon      findings from the PET scan or a referral to cardiothoracic surgery      for a VATS biopsy.  An alternative approach to the lung nodule      would be to do a followup CT scan in 2 months to evaluate for      changes in the nodule and decisions made about diagnostic process      after that.  4. Hematuria.  The patient had hematuria which was felt to be      associated with his anticoagulation.  A renal ultrasound was done      which was unremarkable.  Urinalysis was sent which was suggestive      of a urinary tract infection and the urine will be  resent for      culture.  Empirically, the patient has been started on      ciprofloxacin, however, this should be modified based upon the      results of the culture or the repeat urinalysis.  5. Pulmonary hypertension with resultant cor pulmonale.  The patient      has right-sided heart failure both with systolic dysfunction in the      right ventricle and marked dilatation in the right atrium and      inferior vena cava.  Thus, the patient will likely need to continue      on diuretics for preload reduction.  However, it is important to      remember that the patient will need adequate preload for forward      flow in the context of his pulmonary hypertension.  In terms of his      hematuria, this could certainly be worked up as an outpatient as      the patient's hemoglobin has remained stable and I do not believe      that cystoscopy would be appropriate for this patient in hospital.      I will recommend that that be deferred to be followed up as an      outpatient if the patient does continue to have hematuria.      Otherwise, the patient has remained stable up until this point.      The plans are as delineated above.   DIETARY RESTRICTIONS:  The patient should be on a heart-healthy diet.   PHYSICAL RESTRICTIONS:  Activity as tolerated.  The patient will likely need oxygen at the time  of discharge from the hospital as a consequence of his cor pulmonale.      Altha Harm, MD  Electronically Signed     MAM/MEDQ  D:  08/22/2008  T:  08/22/2008  Job:  [id number]   cc:   Delaney Meigs, M.D.  Fax: [phone]

## 2010-12-13 NOTE — Cardiovascular Report (Signed)
NAMERAMESES, OU NO.:  [id number]   MEDICAL RECORD NO.:  [id number]                   PATIENT TYPE:  OIB   LOCATION:  6501                                 FACILITY:  [facility]   PHYSICIAN:  Rollene Rotunda, M.D.                DATE OF BIRTH:  18-Dec-1945   DATE OF PROCEDURE:  02/12/2004  DATE OF DISCHARGE:                              CARDIAC CATHETERIZATION   PRIMARY CARE PHYSICIAN:  Dr. Colon Flattery   PROCEDURE:  Left heart catheterization/coronary arteriography.   INDICATIONS:  Evaluate patient with an abnormal Cardiolite suggesting  reduced ejection fraction and a possible fixed inferior wall defect.  The  patient presented with shortness of breath as a predominant complaint.   PROCEDURE NOTE:  Left heart catheterization was performed via the left  femoral artery.  Initially, we started at the right femoral artery approach.  The vessel was cannulated using an anterior wall puncture and was easily  accessible.  A #5-French arterial sheath was inserted via the modified  Seldinger technique.  However, we were unable to advance the regular J-wire.  Therefore, we switched out for a Wholey wire which was advanced gently up  the femoral meeting obstruction in the common iliac.  The guidewire was  withdrawn and a shot was obtained with a right Judkins' catheter advanced  just below the point of obstruction.  This demonstrated severe plaque with  dissection, but forward flow.  The patient had no complaints.  We switched  to the left side and this vessel was again cannulated using anterior wall  puncture.  A #5-French arterial sheath was inserted via modified Seldinger  technique.  Guidewire was easily advanced from this side.  We used pigtail,  no-torque, and Judkins' catheters.  Performed a right heart catheterization  via the right femoral vein.  This vessel was cannulated using anterior wall  puncture.  A #8-French venous sheath was inserted via the  modified Seldinger  technique.  A Swan-Ganz catheter was utilized.  The patient was taken off  the table.  He had no leg pain.  He had a palpable and dopplerable posterior  tibial pulse on the right side with no dorsalis pedis.  There was no  differential coolness or cyanosis or mottling noted on his right versus his  left leg.   RESULTS:  HEMODYNAMICS:  LV 161/25, AO 170/89.   CORONARIES:  The left main was normal.   The LAD had diffuse luminal irregularities.  The first diagonal was large  and normal.  The second diagonal was large and normal.   The circumflex in the AV groove had luminal irregularities.  There was a  large mid obtuse marginal which was branching and an ostial 25% stenosis.   The right coronary artery was a large, dominant vessel and was normal.   LEFT VENTRICULOGRAM:  The left ventriculogram was obtained in the RAO projection.  The  EF was 65%  with normal wall motion.   PRESSURES:  LV 161/25, AO 170/89.  RA mean 10.  RV 46/12.  PA 44/21 with a mean of 31.  Pulmonary capillary wedge pressure mean 22.  Cardiac output/cardiac index  (Fick) 4.6/2.21.  Shunt run for saturation.  The patient had arterial  saturations taken in the inferior vena cava, low, mid, and high RA, superior  vena cava, right ventricle and pulmonary artery.  There was no evidence of a  step-up in arterial oxygen saturations and no evidence of a shunt could be  calculated.   CONCLUSION:  Minimal coronary plaque.  Severe right iliac plaque with  dissection.   PLAN:  The patient will continue with risk reduction.  He does have some  mildly elevated pulmonary pressures which may explain some of his dyspnea.  I will refer him back to Dr. Diona Browner for further evaluation.  I have asked  Dr. Samule Ohm to review his peripheral vascular disease and whether further  treatment of his right iliac is needed at this time.  The patient, again,  remains asymptomatic with pulses.                                                Rollene Rotunda, M.D.    JH/MEDQ  D:  02/12/2004  T:  02/12/2004  Job:  [id number]   cc:   Colon Flattery, D.O.  [address]  [place]  [place] [address]  Fax: [phone]   [facility]

## 2010-12-13 NOTE — Op Note (Signed)
Francisco Tanner, BRAMHALL NO.:  [id number]   MEDICAL RECORD NO.:  [id number]                   PATIENT TYPE:  OIB   LOCATION:  6501                                 FACILITY:  [facility]   PHYSICIAN:  Salvadore Farber, M.D. [facility]         DATE OF BIRTH:  Dec 12, 1945   DATE OF PROCEDURE:  02/12/2004  DATE OF DISCHARGE:                                 OPERATIVE REPORT   PROCEDURE PERFORMED:  Abdominal aortography with bilateral lower extremity  runoff using step table technique.   INDICATIONS FOR PROCEDURE:  Francisco Tanner is a 65 year old gentleman who  underwent cardiac catheterization by Dr. Antoine Poche today.  During sheath  placement in the right common femoral artery there was dissection of the  vessel.  Abdominal aortography demonstrated occlusion of the common femoral  artery.  Distal pulses were Dopplerable.  The chronicity of the occlusion  was unclear based on symptoms of angiography.  Specifically, the patient  complained of longstanding bilateral leg tiredness occurring with  exertion.  Denied any frank buttock or calf claudication on either side. The  left-sided iliac vessels appeared normal based on imaging.  He was thus  brought to the peripheral vascular lab for further imaging with plan for  probable revascularization of the dissection on the right.   DESCRIPTION OF PROCEDURE:  Informed consent was obtained.  The pre-existing  5 French right common femoral arterial sheath was exchanged for a fresh 5  [place] sheath. A pigtail catheter was advanced to the infrarenal abdominal  aorta.  Abdominal aortography was performed by power injection.  Bilateral  lower extremity runoff was performed by using step table technique. This  demonstrated features of chronic total occlusion with stump in the proximal  common iliac and robust lumbar collaterals to both the internal iliac and  the common femoral artery.  Reconstitution appeared to be at the common  femoral  though this was not very well defined.  It is possible that there  was reconstitution in the external iliac.  Based on his finding of chronic  total occlusion with preserved flow distally and preserved collaterals,  decision was made for conservative management of the dissection.  Sheaths  were then removed and manual compression applied.   FINDINGS:  1. Abdominal aorta:  Moderate plaquing without stenosis.  2. Left leg:  20% stenosis of the midportion of the common iliac.  Patent     internal iliac without significant stenosis.  Normal external iliac and     common femoral.  Profunda is normal.  The SFA has a 30% stenosis in its     proximal segment.  There is no significant disease in the popliteal and     three vessel runoff to the foot.  3. Right leg:  The common iliac is occluded proximally.  The internal iliac     artery is nicely collateralized.  There is reconstitution at the common  femoral.  The profunda is widely patent.  There is no significant disease     of the SFA or popliteal.  Runoff is difficult to establish due to minimal     contrast reaching these vessels.   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  Symptoms are vague.  Will plan on conservative management of  nonocclusive dissection for the short term.  Will then follow up in the  office for further discussion.                                               Salvadore Farber, M.D. [facility]    WED/MEDQ  D:  02/12/2004  T:  02/12/2004  Job:  [id number]   cc:   Jonelle Sidle, M.D. [facility]   Rollene Rotunda, M.D.   Colon Flattery, D.O.  [address]  [place]  [place] [address]  Fax: [phone]

## 2011-02-07 ENCOUNTER — Encounter (HOSPITAL_COMMUNITY): Payer: Self-pay

## 2011-02-07 ENCOUNTER — Inpatient Hospital Stay (HOSPITAL_COMMUNITY)
Admission: EM | Admit: 2011-02-07 | Discharge: 2011-02-13 | DRG: 176 | Disposition: A | Discharge status: Home / Self Care | Payer: Managed Care, Other (non HMO) | Attending: Internal Medicine | Admitting: Internal Medicine

## 2011-02-07 ENCOUNTER — Emergency Department (HOSPITAL_COMMUNITY): Payer: Managed Care, Other (non HMO)

## 2011-02-07 DIAGNOSIS — I509 Heart failure, unspecified: Secondary | ICD-10-CM | POA: Diagnosis present

## 2011-02-07 DIAGNOSIS — M549 Dorsalgia, unspecified: Secondary | ICD-10-CM | POA: Diagnosis present

## 2011-02-07 DIAGNOSIS — J4489 Other specified chronic obstructive pulmonary disease: Secondary | ICD-10-CM | POA: Diagnosis present

## 2011-02-07 DIAGNOSIS — I5042 Chronic combined systolic (congestive) and diastolic (congestive) heart failure: Secondary | ICD-10-CM | POA: Diagnosis present

## 2011-02-07 DIAGNOSIS — J449 Chronic obstructive pulmonary disease, unspecified: Secondary | ICD-10-CM | POA: Diagnosis present

## 2011-02-07 DIAGNOSIS — I519 Heart disease, unspecified: Secondary | ICD-10-CM | POA: Diagnosis present

## 2011-02-07 DIAGNOSIS — I2699 Other pulmonary embolism without acute cor pulmonale: Principal | ICD-10-CM | POA: Diagnosis present

## 2011-02-07 DIAGNOSIS — N3289 Other specified disorders of bladder: Secondary | ICD-10-CM | POA: Diagnosis present

## 2011-02-07 DIAGNOSIS — R6889 Other general symptoms and signs: Secondary | ICD-10-CM | POA: Diagnosis present

## 2011-02-07 DIAGNOSIS — N400 Enlarged prostate without lower urinary tract symptoms: Secondary | ICD-10-CM | POA: Diagnosis present

## 2011-02-07 DIAGNOSIS — I4892 Unspecified atrial flutter: Secondary | ICD-10-CM | POA: Diagnosis present

## 2011-02-07 DIAGNOSIS — G8929 Other chronic pain: Secondary | ICD-10-CM | POA: Diagnosis present

## 2011-02-07 DIAGNOSIS — Z96649 Presence of unspecified artificial hip joint: Secondary | ICD-10-CM

## 2011-02-07 DIAGNOSIS — G4700 Insomnia, unspecified: Secondary | ICD-10-CM | POA: Diagnosis present

## 2011-02-07 DIAGNOSIS — I2789 Other specified pulmonary heart diseases: Secondary | ICD-10-CM | POA: Diagnosis present

## 2011-02-07 HISTORY — DX: Chronic obstructive pulmonary disease, unspecified: J44.9

## 2011-02-07 LAB — CBC
HCT: 46.7 % (ref 39.0–52.0)
Hemoglobin: 15.4 g/dL (ref 13.0–17.0)
RBC: 4.95 MIL/uL (ref 4.22–5.81)
WBC: 7.3 10*3/uL (ref 4.0–10.5)

## 2011-02-07 LAB — DIFFERENTIAL
Basophils Absolute: 0 10*3/uL (ref 0.0–0.1)
Lymphocytes Relative: 12 % (ref 12–46)
Monocytes Absolute: 0.5 10*3/uL (ref 0.1–1.0)
Neutro Abs: 5.9 10*3/uL (ref 1.7–7.7)
Neutrophils Relative %: 80 % — ABNORMAL HIGH (ref 43–77)

## 2011-02-07 LAB — SAMPLE TO BLOOD BANK

## 2011-02-07 LAB — COMPREHENSIVE METABOLIC PANEL
ALT: 12 U/L (ref 0–53)
AST: 13 U/L (ref 0–37)
Albumin: 3.5 g/dL (ref 3.5–5.2)
CO2: 25 mEq/L (ref 19–32)
Chloride: 101 mEq/L (ref 96–112)
GFR calc non Af Amer: >60 mL/min (ref >60)
Sodium: 139 mEq/L (ref 135–145)
Total Bilirubin: 0.7 mg/dL (ref 0.3–1.2)

## 2011-02-07 LAB — CK TOTAL AND CKMB (NOT AT ARMC): Relative Index: INVALID (ref 0.0–2.5)

## 2011-02-07 MED ORDER — IOHEXOL 300 MG/ML  SOLN
100.0000 mL | Freq: Once | INTRAMUSCULAR | Status: AC | PRN
  Administered 2011-02-07: 100 mL via INTRAVENOUS

## 2011-02-08 LAB — CARDIAC PANEL(CRET KIN+CKTOT+MB+TROPI)
CK, MB: 2.7 ng/mL (ref 0.3–4.0)
CK, MB: 3 ng/mL (ref 0.3–4.0)
Relative Index: INVALID (ref 0.0–2.5)
Total CK: 49 U/L (ref 7–232)
Total CK: 48 U/L (ref 7–232)

## 2011-02-08 LAB — CBC
Hemoglobin: 15.2 g/dL (ref 13.0–17.0)
MCH: 30.4 pg (ref 26.0–34.0)
MCH: 30.8 pg (ref 26.0–34.0)
MCV: 94.4 fL (ref 78.0–100.0)
Platelets: 168 10*3/uL (ref 150–400)
RBC: 5 MIL/uL (ref 4.22–5.81)
RBC: 4.61 MIL/uL (ref 4.22–5.81)
WBC: 9.2 10*3/uL (ref 4.0–10.5)
WBC: 7.6 10*3/uL (ref 4.0–10.5)

## 2011-02-08 LAB — BASIC METABOLIC PANEL
CO2: 24 mEq/L (ref 19–32)
Calcium: 9.3 mg/dL (ref 8.4–10.5)
Chloride: 100 mEq/L (ref 96–112)
Glucose, Bld: 92 mg/dL (ref 70–99)
Sodium: 136 mEq/L (ref 135–145)

## 2011-02-08 LAB — PROTIME-INR: Prothrombin Time: 14.4 seconds (ref 11.6–15.2)

## 2011-02-08 LAB — MRSA PCR SCREENING: MRSA by PCR: NEGATIVE

## 2011-02-09 DIAGNOSIS — I369 Nonrheumatic tricuspid valve disorder, unspecified: Secondary | ICD-10-CM

## 2011-02-09 DIAGNOSIS — I4892 Unspecified atrial flutter: Secondary | ICD-10-CM

## 2011-02-09 LAB — URINALYSIS, ROUTINE W REFLEX MICROSCOPIC
Glucose, UA: NEGATIVE mg/dL
Ketones, ur: 40 mg/dL — AB
Protein, ur: NEGATIVE mg/dL
Urobilinogen, UA: 1 mg/dL (ref 0.0–1.0)

## 2011-02-09 LAB — COMPREHENSIVE METABOLIC PANEL
Alkaline Phosphatase: 39 U/L (ref 39–117)
BUN: 11 mg/dL (ref 6–23)
CO2: 29 mEq/L (ref 19–32)
GFR calc Af Amer: >60 mL/min (ref >60)
GFR calc non Af Amer: >60 mL/min (ref >60)
Glucose, Bld: 96 mg/dL (ref 70–99)
Potassium: 4.4 mEq/L (ref 3.5–5.1)
Total Bilirubin: 0.5 mg/dL (ref 0.3–1.2)
Total Protein: 6.7 g/dL (ref 6.0–8.3)

## 2011-02-09 LAB — CBC
HCT: 44.7 % (ref 39.0–52.0)
MCH: 31 pg (ref 26.0–34.0)
MCV: 95.5 fL (ref 78.0–100.0)
Platelets: 163 10*3/uL (ref 150–400)
RBC: 4.68 MIL/uL (ref 4.22–5.81)

## 2011-02-09 LAB — MAGNESIUM: Magnesium: 1.9 mg/dL (ref 1.5–2.5)

## 2011-02-10 DIAGNOSIS — I4892 Unspecified atrial flutter: Secondary | ICD-10-CM

## 2011-02-10 LAB — PROTIME-INR
INR: 1.68 — ABNORMAL HIGH (ref 0.00–1.49)
Prothrombin Time: 20.1 seconds — ABNORMAL HIGH (ref 11.6–15.2)

## 2011-02-10 LAB — BASIC METABOLIC PANEL
CO2: 31 mEq/L (ref 19–32)
Chloride: 98 mEq/L (ref 96–112)
GFR calc Af Amer: >60 mL/min (ref >60)
Sodium: 135 mEq/L (ref 135–145)

## 2011-02-10 LAB — MAGNESIUM: Magnesium: 2.1 mg/dL (ref 1.5–2.5)

## 2011-02-11 LAB — CBC
HCT: 44.8 % (ref 39.0–52.0)
Hemoglobin: 14.5 g/dL (ref 13.0–17.0)
MCHC: 32.4 g/dL (ref 30.0–36.0)
RDW: 15.4 % (ref 11.5–15.5)
WBC: 7.8 10*3/uL (ref 4.0–10.5)

## 2011-02-11 LAB — BASIC METABOLIC PANEL
BUN: 24 mg/dL — ABNORMAL HIGH (ref 6–23)
Chloride: 99 mEq/L (ref 96–112)
GFR calc Af Amer: >60 mL/min (ref >60)
GFR calc non Af Amer: >60 mL/min (ref >60)
Glucose, Bld: 107 mg/dL — ABNORMAL HIGH (ref 70–99)
Potassium: 4.4 mEq/L (ref 3.5–5.1)
Sodium: 135 mEq/L (ref 135–145)

## 2011-02-11 LAB — PROTIME-INR: INR: 2.93 — ABNORMAL HIGH (ref 0.00–1.49)

## 2011-02-12 ENCOUNTER — Ambulatory Visit (HOSPITAL_COMMUNITY)
Admission: RE | Admit: 2011-02-12 | Discharge: 2011-02-12 | Disposition: A | Discharge status: Home / Self Care | Payer: Managed Care, Other (non HMO) | Source: Ambulatory Visit | Attending: Cardiology | Admitting: Cardiology

## 2011-02-12 LAB — PROTIME-INR
INR: 2.88 — ABNORMAL HIGH (ref 0.00–1.49)
Prothrombin Time: 30.6 seconds — ABNORMAL HIGH (ref 11.6–15.2)

## 2011-02-13 LAB — CBC
Hemoglobin: 14.1 g/dL (ref 13.0–17.0)
MCH: 30.6 pg (ref 26.0–34.0)
MCHC: 31.6 g/dL (ref 30.0–36.0)
Platelets: 130 10*3/uL — ABNORMAL LOW (ref 150–400)

## 2011-02-13 LAB — BASIC METABOLIC PANEL
BUN: 20 mg/dL (ref 6–23)
Calcium: 9.3 mg/dL (ref 8.4–10.5)
Chloride: 101 mEq/L (ref 96–112)
Creatinine, Ser: 0.6 mg/dL (ref 0.50–1.35)
GFR calc Af Amer: >60 mL/min (ref >60)
GFR calc non Af Amer: >60 mL/min (ref >60)

## 2011-02-13 LAB — PROTIME-INR
INR: 2.78 — ABNORMAL HIGH (ref 0.00–1.49)
Prothrombin Time: 29.8 seconds — ABNORMAL HIGH (ref 11.6–15.2)

## 2011-02-13 NOTE — Cardiovascular Report (Signed)
  NAMETRON, FLYTHE NO.:  [id number]  MEDICAL RECORD NO.:  [id number]  LOCATION:  ENDO                         FACILITY:  [facility]  PHYSICIAN:  Marca Ancona, MD      DATE OF BIRTH:  [birth year]  DATE OF PROCEDURE:  02/12/2011 DATE OF DISCHARGE:  02/12/2011                           CARDIAC CATHETERIZATION   PROCEDURE:  Direct current cardioversion.  INDICATIONS:  This is 65 year old who developed atrial flutter in the setting of pulmonary embolism.  He remained in atrial flutter several days later.  His Coumadin is now therapeutic at 2.8 INR today.  His heart rate does not get up above 100 with an exertion, therefore, we undertook direct current cardioversion.  PROCEDURE NOTE:  After the patient gave informed consent, he underwent transesophageal echocardiogram, showed no left atrial appendage thrombus.  The patient then sedated by Anesthesiology using IV propofol. He did receive one direct current shock to the chest with a 120 joules biphasic waveform, the patient converted to normal sinus rhythm.  There were no complications.     Marca Ancona, MD     DM/MEDQ  D:  02/12/2011  T:  02/13/2011  Job:  [id number]  Electronically Signed by Marca Ancona MD on 02/13/2011 01:41:31 PM

## 2011-02-14 NOTE — Discharge Summary (Signed)
NAMESAI, ZINN NO.:  [id number]  MEDICAL RECORD NO.:  [id number]  LOCATION:  1415                         FACILITY:  [facility]  PHYSICIAN:  Rosanna Randy, MDDATE OF BIRTH:  01-Oct-1945  DATE OF ADMISSION:  02/07/2011 DATE OF DISCHARGE:  02/13/2011                              DISCHARGE SUMMARY   PRIMARY CARE PHYSICIAN:  Delaney Meigs, M.D.  DISCHARGE DIAGNOSES: 1. Recurrent atrial flutter status TEE guided cardioversion. 2. Pulmonary embolism/pulmonary infarct and shortness of breath. 3. Chronic obstructive pulmonary disease. 4. 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 5. Pulmonary hypertension. 6. Benign prostatic hypertrophy and bladder spasm. 7. Chronic back pain. 8. Diastolic and systolic congestive heart failure, chronic, stable     and currently compensated. 9. History of left hip arthroplasty. 10.Insomnia. 11.A spiculated area on the patient's right lung seen on CT that will     require further radiologic studies for follow-up over the next 30     to 60 days.  DISCHARGE MEDICATIONS: 1. Metoprolol succinate 75 mg extended release by mouth daily. 2. Flomax 0.4 mg by mouth every 24 hours. 3. Warfarin 5 mg 1 tablet by mouth daily. 4. Ambien 10 mg 1 tablet by mouth daily at bedtime as needed for     insomnia. 5. Oxybutynin 5 mg 1 tablet by mouth daily at bedtime. 6. Symbicort 160/4.5 mcg 2 puffs by mouth twice a day. 7. Vicodin 5/500 one tablet by mouth twice daily as needed for pain.  The patient has been instructed to stop using aspirin and also to stop the use of Dyazide.  DISPOSITION AND FOLLOWUP:  The patient has been discharged in stable and improved condition, currently not complaining of any shortness of breath, chest pain, nausea, vomiting or any other acute complaints.  He had a mild back discomfort which is chronic and associated with his history of chronic back pain that is not out of the  extraordinary at this point.  The patient has been instructed to follow heart-healthy diet, to be compliant with his medication and to follow with primary care physician tomorrow morning at 8:30 a.m., the date is February 14, 2011, and that appointment is going to be important for the patient to have a CBC in order to follow on his hemoglobin and also on his platelet count and also a PT and INR for further adjustment of his Coumadin that he had been started on.  The patient will also need further followup of his chronic medical problems and subsequent followup to be arranged for his Coumadin.  Goal is 2 to 3.  The patient will also have an appointment with Cardiology.  The cardiology office is going to contact the patient in order to set this appointment up and will also arrange the patient to be seen by the electrophysiology for atrial flutter ablation as an outpatient.  The patient is going to follow by his pulmonologist Dr. Sherene Sires as previously indicated prior to this admission for further treatment and adjustment of the medications for his COPD.  It is going to be important that in about 30 to 60 days, the patient have repeat CT scan of the chest.  This something that his primary care physician will need to follow up in order to look again at this spiculated area of the right lung that might actually have some concerns for malignancy, according to the patient's  radiologic image results during this admission.  PROCEDURES PERFORMED DURING THIS ADMISSION:  The patient had a chest x- ray that demonstrated right base opacity, indeterminate, may represent loculated pleural fluid, air space consolidation or pulmonary mass. Radiographic followup advised to ensure resolution and he also had a CT angio of the chest on February 07, 2011, that demonstrated: 1. Evidence of subacute pulmonary embolism in the right upper and     right lower lobe. 2. Secondary pulmonary infarcts in the right upper and lower  lobe. 3. Spiculated area in the right lower lobe that needs to be followed     up in the next 30 to 60 days to check for improvement since this     could represent malignancy, also this is felt to be less likely.  The patient also had a TEE performed by Dr. Jenne Campus with repeated cardioversion to control his atrial fibrillation.  on that TEE, results demonstrated systolic function mildly reduced with an ejection fraction of 45-50% and diffuse hypokinesis.  Aortic valves without any stenosis. Right ventricle moderately to severely dilated and moderately reduced systolic function of the right ventricle.  No other procedures were performed during this hospitalization.  CONSULTATIONS:  Cardiology was consulted during this admission.  Please refer to consultations and procedure note by [facility] cardiology physician for further details.  HISTORY OF PRESENT ILLNESS:  For full details, please refer to dictation done by Dr. Onalee Hua on February 08, 2011, or briefly he is a 65 year old gentleman with a past medical history significant for COPD as well as diastolic heart failure who presented with a chief complaint of shortness of breath.  The patient reported that the shortness of breath that started a few days prior to admission has been progressive in nature.  The patient reports that the shortness of breath is worse with exertion, having slowly but progressively worsened.  The patient also reports to be having some palpitations.  The patient was admitted for further evaluation and treatment.  He was found to be on atrial flutter and his EKG reported a right bundle branch block that was chronic.  A CT angio due to elevated D-dimer in the ED demonstrated subacute pulmonary embolism and pulmonary infarct.  PERTINENT LABORATORY DATA:  Pertinent laboratory data throughout this hospitalization includes a CBC with differential showing white blood cells of 7.3, hemoglobin 15.4, platelets 192 on  admission.  A comprehensive metabolic panel showed a sodium of 138, potassium 3.8, chloride 101, bicarb 25, blood glucose 98, BUN 9, creatinine 0.63.  MRSA PCR screen was negative.  Cardiac markers negative x3.  The patient was started on dual therapy with Lovenox and also Coumadin.  By discharge his PT and INR were therapeutic.  A TSH showing a level of 0.836. Urinalysis and microscopy were essentially negative ruling out any urine infection.  A magnesium level was 2.1 and at discharge CBC, white blood cell 7.0, hemoglobin 14.1, platelets 146.  PT 29.8, INR 2.78.  Sodium 139, potassium 4.4, chloride 101, bicarb 30, glucose 106, BUN 20, creatinine 0.60, calcium 9.3.  HOSPITAL COURSE BY PROBLEM: 1. Atrial flutter which is recurrent and at this moment most likely     secondary to the patient's pulmonary embolism, pulmonary infarct     and subsequent right heart failure.  The  patient was followed by     [facility] Cardiology throughout this hospitalization.  He ended     receiving transesophageal echocardiogram-guided cardioversion.  He     is currently on sinus rhythm and is on anticoagulation.  The     patient will follow with Cardiology and also with Electrophysiology     for atrial flutter ablation as an outpatient and will continue     using Toprol extended release 75 mg by mouth daily for rate     control. 2. Pulmonary embolism/pulmonary infarct and shortness of breath.  The     patient has been placed on Coumadin for a goal of 2 to 3 INR range     and received 5 days overlapping dual therapy with Lovenox and     Coumadin.  He is going to follow with primary care physician on     February 14, 2011, at 8:30 in the morning in order to being hooked up     to the Coumadin clinic and have further adjustment of his warfarin     level. 3. Right ventricular dysfunction/failure secondary to pulmonary     hypertension, pulmonary embolism/pulmonary infarct.  At this     moment, Cardiology has  recommended close followup as an outpatient     and to repeat a 2-D echo in the next 2 to 3 months after being on     treatment for the pulmonary embolism looking to see how the     function of the right ventricle improves after treating the     pulmonary embolism. 4. COPD which had been stable.  The patient not having any wheezing or     acute exacerbation.  He is going to continue using Symbicort and     will follow with pulmonologist Dr. Sandrea Hughs as an outpatient as     previously scheduled for him. 5. BPH and bladder spasm.  The patient is going to continue using     Flomax and also oxybutynin. 6. Chronic back pain.  The patient will continue using p.r.n. Vicodin. 7. Diastolic/systolic congestive heart failure, stable, compensated.     No signs of exacerbation, edema or JVD.  At this point the patient     will follow with Cardiology and primary care physician, further     adjustment on medications and decision of further tests depending     on the patient's evolution. 8. Insomnia.  We are going to continue using p.r.n. Ambien. 9. Spiculated area on his right lung.  This is something that needs to     be followed over the next 30 to 60 days with another radiology exam     started with an x-ray and if needed a repeat CT scan just to rule     out that there are no masses or any other abnormal process causing     this spiculated area, specifically concerns of malignancy.  DISCHARGE PHYSICAL EXAMINATION:  VITAL SIGNS:  At discharge, temperature was 97.9, heart rate 71, respiratory rate 18, blood pressure 150/87, oxygen saturation 92% on room air at rest and 94% room air saturation during ambulation. GENERAL:  In general, the patient was in no acute distress. RESPIRATORY SYSTEM:  No wheezing.  No crackles.  Good air movement. HEART:  Regular rate and rhythm.  No murmurs. ABDOMEN:  Soft, nontender, nondistended.  Positive bowel sounds. EXTREMITIES:  Without any edema. NEUROLOGIC  EXAM:  Nonfocal.     Rosanna Randy, MD  CEM/MEDQ  D:  02/13/2011  T:  02/13/2011  Job:  [id number]  cc:   Charlaine Dalton. Sherene Sires, MD, [facility]P [address] [place] [address]  Rollene Rotunda, MD, [facility] [address]  [address] [place] [address]  Delaney Meigs, M.D. Fax: [phone]  Electronically Signed by Vassie Loll MD on 02/14/2011 04:38:10 PM

## 2011-02-17 ENCOUNTER — Encounter: Payer: Managed Care, Other (non HMO) | Admitting: *Deleted

## 2011-02-17 ENCOUNTER — Encounter: Payer: Self-pay | Admitting: Cardiology

## 2011-02-17 NOTE — Consult Note (Signed)
[name], [name] NO.:  [id number]  MEDICAL RECORD NO.:  [id number]  LOCATION:  1224                         FACILITY:  [facility]  PHYSICIAN:  [name]. Eden Emms, MD, FACCDATE OF BIRTH:  Apr 07, 1946  Primary Cardiologist: Hochrein                                CONSULTATION   REASON FOR CONSULTATION:  A 65 year old patient we are asked to see for atrial flutter.  HISTORY OF PRESENT ILLNESS:  [name] is a 65-year patient followed by Dr. Antoine Poche.  He has no known coronary disease with normal cath back in 2005.  He has had previous history of atrial flutter and underwent TEE guided cardioversion in August 23, 2008.  Coumadin was subsequently stopped.  The patient subsequently was admitted to the hospital with significant shortness of breath.  He was found to have pulmonary emboli by CT scan. He subsequently developed rapid atrial fibrillation and flutter.  He has had multiple orthopedic problems including back surgery and Isuspect he has had subacute DVTs over the last 1 to 3 months given all his orthopedic procedures.  He has had some swelling in the left lower extremity over the last few days and has chronic hip and back pain.  He is currently unaware of any cardiac symptoms.  He is not having any significant chest pain, palpitations or syncope.  His shortness of breath is somewhat improved since hospital admission.  He is being anticoagulated.  Ten-point review of systems is otherwise remarkable for chronic orthopedic pain, particularly in his back, hips and knees.  All other systems reviewed and negative.  PAST MEDICAL HISTORY:  Remarkable for COPD, history of atrial flutter with TEE guided cardioversion in 2010, previous left hip arthroplasty, multiple previous back surgeries.  MEDICATIONS:  Include Vicodin, Symbicort, Cardizem 240, aspirin and Ambien.  ALLERGIES:  He has no known drug allergies.  SOCIAL HISTORY:  The patient lives in  [place].  He lives alone.  His brother was in the room with him today.  The patient quit smoking 8 years ago.  He continues to drink beer 3 to 4 times per week but no illicit drug use.  FAMILY HISTORY:  Remarkable for premature coronary artery disease in his father.  PHYSICAL EXAMINATION:  VITAL SIGNS:  His exam is remarkable for atrial flutter at a rate of 110.  His blood pressure is stable at 128/80, sats 99% on 3 L. HEENT:  Unremarkable.  Carotids normal without bruit.  No lymphadenopathy, thyromegaly, or JVP elevation. LUNGS:  Decreased breath sounds at both bases.  S1, S2 with distant heart sounds.  PMI normal. ABDOMEN:  Protuberant.  Bowel sounds positive.  No AAA.  No tenderness. No bruit.  No hepatosplenomegaly, hepatojugular reflux or tenderness. Distal pulses are intact.  He has mild left lower extremity edema. NEURO:  Nonfocal. SKIN:  Warm and dry.  No muscular weakness.  RADIOLOGIC STUDIES:  CT angio of the chest shows subacute pulmonary emboli in the right upper and lower lobes, secondary pulmonary infarcts in right upper and lower lobes, spiculated area in the right lower lobe that needs further followup to rule out cancer in a previous smoker.  I reviewed his 2-D echocardiogram which  shows significant RA and RV dilatation suggesting elevated PA pressures in the setting of recent pulmonary emboli.  No significant valve disease and normal left-sided ejection fraction of 60%.  Lab work is remarkable for TSH of 0.8, potassium 4.4, creatinine 0.68, protime 1.08, hematocrit 44.7, negative cardiac panel.  EKG shows atrial flutter.  The flutter appears typical with a right bundle-branch block which is old.  IMPRESSION: 1. Rapid atrial flutter in the setting of recent pulmonary emboli.     The patient is being anticoagulated anyway.  We will continue his     Cardizem for rate control and add Coreg 6.25 b.i.d.  Given his     fairly significant pulmonary emboli with  lung infarcts and stable     hemodynamic status, I would not be in a hurry to cardiovert this     patient.  I think for the time being rate control and     anticoagulation is in order after he has been on Coumadin for at     least 3 months and his pulmonary clot burden appears stable.  It     may be reasonable if he is still in flutter to consider followup     cardioversion or flutter ablation with Dr. Johney Frame. 2. Anticoagulation.  Two indications regarding atrial flutter as well     as pulmonary emboli.  The patient should have at least a 24-hour     overlap in regards to continuing his heparin after his INR is above     2.2 and given his clot burden this may take a while. 3. Chronic back pain with laminectomy and previous hip surgery.  This     is a significant problem that has also probably led to his DVT.     May be worthwhile to have pain management consult on the patient in     regards to this.  Continue aspirin and Norco.  We will be happy to follow the patient along in the hospital.     Theron Arista C. Eden Emms, MD, [facility]     PCN/MEDQ  D:  02/09/2011  T:  02/09/2011  Job:  [id number]  Electronically Signed by Charlton Haws MD [facility] on 02/17/2011 10:19:06 PM

## 2011-02-19 ENCOUNTER — Ambulatory Visit (INDEPENDENT_AMBULATORY_CARE_PROVIDER_SITE_OTHER): Payer: Managed Care, Other (non HMO) | Admitting: Cardiology

## 2011-02-19 ENCOUNTER — Encounter: Payer: Self-pay | Admitting: Cardiology

## 2011-02-19 DIAGNOSIS — I5030 Unspecified diastolic (congestive) heart failure: Secondary | ICD-10-CM

## 2011-02-19 DIAGNOSIS — J984 Other disorders of lung: Secondary | ICD-10-CM

## 2011-02-19 DIAGNOSIS — I2699 Other pulmonary embolism without acute cor pulmonale: Secondary | ICD-10-CM

## 2011-02-19 DIAGNOSIS — I4892 Unspecified atrial flutter: Secondary | ICD-10-CM

## 2011-02-19 DIAGNOSIS — I5032 Chronic diastolic (congestive) heart failure: Secondary | ICD-10-CM | POA: Insufficient documentation

## 2011-02-19 NOTE — Assessment & Plan Note (Signed)
We had a long discussion about this. We might consider ablation if he has continued episodes and after other issues such as his lung nodule had been addressed. Right now we will continue with the beta blocker and Coumadin per Dr. Lysbeth Galas

## 2011-02-19 NOTE — Assessment & Plan Note (Signed)
Given his arrhythmia and this he will continue on Coumadin indefinitely.

## 2011-02-19 NOTE — Assessment & Plan Note (Signed)
He seems to be euvolemic.  At this point, no change in therapy is indicated.  We have reviewed salt and fluid restrictions.  No further cardiovascular testing is indicated.   

## 2011-02-19 NOTE — Assessment & Plan Note (Signed)
He has a followup with Dr. Sherene Sires to evaluate this.

## 2011-02-19 NOTE — Progress Notes (Signed)
HPI The patient presents for followup after recent hospitalization for management of shortness of breath. He was found to have atrial flutter and underwent TEE cardioversion. There was also evidence of pulmonary embolism and pulmonary infarcts.  He was also found to have a lung nodule.  Since discharge she has had no further tachycardia palpitations. I reviewed all available hospital records. He is limited by hip and back pain. He is breathing is much improved however. He's not having any chest pressure, neck or arm discomfort. Is not having any palpitations, presyncope or syncope. He said no PND or orthopnea. He has continued left greater than right lower extremity swelling.  No Known Allergies  Current Outpatient Prescriptions  Medication Sig Dispense Refill  . budesonide-formoterol (SYMBICORT) 160-4.5 MCG/ACT inhaler Inhale 2 puffs into the lungs every 12 (twelve) hours.        [name] HYDROcodone-acetaminophen (VICODIN) 5-500 MG per tablet Take 1 tablet by mouth every 6 (six) hours as needed.        . metoprolol (TOPROL-XL) 50 MG 24 hr tablet Take 50 mg by mouth daily.        [name] oxybutynin (DITROPAN) 5 MG tablet Take 5 mg by mouth daily.        . Tamsulosin HCl (FLOMAX) 0.4 MG CAPS Take 0.4 mg by mouth at bedtime.        [name] warfarin (COUMADIN) 2.5 MG tablet Take 2.5 mg by mouth as directed.        . zolpidem (AMBIEN) 10 MG tablet Take 10 mg by mouth as needed.          Past Medical History  Diagnosis Date  . CHF (congestive heart failure)   . COPD (chronic obstructive pulmonary disease)     Wert. PFTs 10/09/08 FEV1 1.39 (44%) ratio 40, DLCO 63%. 16% improvement after bronchodilator. overnight pulse ox 02/28/09 5:1m pent with sat< 89 sleeping > declined further o2 07/05/09. HFA 75% 04/23/09.   [name] Atrial flutter     echo 09/17/08 nl EF, mild LAE, RV mod dilated, pasystolic 46. noct desat 02/28/09, refused 02 @ hs   . Abnormal CT scan     SPN right mid zone. first detected 08/16/08; see [facility]   . Weight  gain     target weight 196; BMI <30  . Lung nodule     Past Surgical History  Procedure Date  . Hip replacement surgery     L   . Kyphosis surgery     ROS:  As stated in the HPI and negative for all other systems.  PHYSICAL EXAM BP 122/78  Pulse 59  Resp 16  Ht 5\' 7"  (1.702 m)  Wt 215 lb (97.523 kg)  BMI 33.67 kg/m2 PHYSICAL EXAM GEN:  No distress NECK:  No jugular venous distention at 90 degrees, waveform within normal limits, carotid upstroke brisk and symmetric, no bruits, no thyromegaly LYMPHATICS:  No cervical adenopathy LUNGS:  Clear to auscultation bilaterally BACK:  No CVA tenderness CHEST:  Unremarkable HEART:  S1 and S2 within normal limits, no S3, no S4, no clicks, no rubs, no murmurs ABD:  Positive bowel sounds normal in frequency in pitch, no bruits, no rebound, no guarding, unable to assess midline mass or bruit with the patient seated. EXT:  2 plus pulses throughout, moderate left greater than right edema, no cyanosis no clubbing SKIN:  No rashes no nodules NEURO:  Cranial nerves II through XII grossly intact, motor grossly intact throughout PSYCH:  Cognitively intact, oriented to  person place and time  EKG:  Sinus rhythm, right bundle branch block, right axis deviation, rate 59, no change from previous  ASSESSMENT AND PLAN

## 2011-02-25 NOTE — H&P (Signed)
NAMEPERICLES, Tanner NO.:  [id number]  MEDICAL RECORD NO.:  [id number]  LOCATION:  1224                         FACILITY:  [facility]  PHYSICIAN:  Mariea Stable, MD   DATE OF BIRTH:  02-10-1946  DATE OF ADMISSION:  02/07/2011 DATE OF DISCHARGE:                             HISTORY & PHYSICAL   PRIMARY CARE PHYSICIAN:  Delaney Meigs, MD  CARDIOLOGIST:  Rollene Rotunda, MD, [facility]  PULMONOLOGIST:  Charlaine Dalton. Sherene Sires, MD, FCCP  CHIEF COMPLAINT:  Shortness of breath.  HISTORY OF PRESENT ILLNESS:  Francisco Tanner is a 65 year old gentleman with past medical history significant for COPD as well as diastolic heart failure who presents with chief complaint of shortness of breath.  He reports that this started a few days ago and has been progressive in nature.  The symptoms have been gradually progressive over this time frame.  Typically, he does not experience any shortness of breath, but he states that over the last few days, it has been progressively worst where he is short of breath ambulating from room to room.  More recently today before coming to the hospital, just bathing, gotten dyspneic enough to where he have to sit down and rest in order to recuperate after showering.  On review of systems, he has had a swollen painful left lower extremity for the last few days.  He has also had chronic back and hip pain which is increased when lying down and he reports this is secondary to the surgeries he has had in the past.  Upon evaluation in the emergency department, his electrocardiogram demonstrated a sinus tachycardia at a rate of 133 with a chronic right bundle-branch block.  At that time because of a wide complex tachycardia, he was placed on an amiodarone drip.  Further workup revealed a normal complete blood count, normal complete metabolic panel, normal cardiac enzymes, but a CT angiogram of the chest revealed evidence of probable subacute pulmonary emboli in  the right upper and lower lobes.  At that time, the case was discussed with Cardiology who felt that this was predominately secondary to PE and not a primary cardiac arrhythmia.  [facility] were called at that time to admit the patient with possible Cardiology consult.  PAST MEDICAL HISTORY: 1. Diastolic congestive heart failure. 2. COPD. 3. Pulmonary hypertension. 4. History of atrial flutter. 5. Status post left hip arthroplasty.  MEDICATIONS: 1. Vicodin 5/100 one tablet p.o. b.i.d. p.r.n. pain. 2. Oxybutynin 5 mg p.o. nightly. 3. Symbicort 160/4.5 mcg 2 puffs inhaled b.i.d. 4. Cardizem 240 mg 1 capsule p.o. every morning. 5. Aspirin 81 mg p.o. daily. 6. Ambien 10 mg p.o. nightly p.r.n. sleep.  ALLERGIES:  No known drug allergies.  SOCIAL HISTORY:  The patient lives in [place], [place] and he lives alone.  His brother, Francisco Tanner, phone number [phone] lives in [place] and is the person to be contacted in case of need for medical decision making.  The patient has a prior to history of tobacco, butquit approximately 8 years ago.  He reports drinking an occasional beer, but this is seldom in between.  He denies any illicit drug use.  FAMILY HISTORY:  Noncontributory.  REVIEW OF SYSTEMS:  As per HPI, all other systems reviewed negative.  PHYSICAL EXAMINATION:  VITAL SIGNS:  Temperature 98.2, blood pressure 147/112, initial heart rate 140, now down to 89; respirations 22, O2 saturation 95% on room air. GENERAL:  This is a middle-aged to elderly appearing gentleman, lying in bed who appears anxious, but in no acute distress per say. HEENT:  Head is normocephalic, atraumatic.  Pupils equally round and reactive to light.  Extraocular movements are intact.  Sclerae are anicteric.  Mucous membranes are moist.  There is no oropharyngeal lesions. NECK:  Supple.  There is no obvious JVD. LUNGS:  Lungs have distant breath sounds with mild expiratory wheeze  on forced expiration.  Otherwise clear. HEART:  There is a very distant heart sounds noted.  There is an S1 and S2 with a borderline tachy rates, but regular rhythm.  Murmurs, gallops or rubs are not audible. ABDOMEN:  Obese but with positive bowel sounds, soft, nontender and no guarding. EXTREMITIES:  Left lower extremity has +1 pitting edema distally and is somewhat tender to palpation.  Right leg is without edema. NEUROLOGIC:  The patient is awake, alert, and oriented x3.  Cranial nerves II through XII are grossly intact.  Motor is grossly intact. Sensation is grossly intact.  Electrocardiogram as mentioned above demonstrates sinus tachycardia at a ventricular rate of 133 with a chronic right bundle-branch block.  LABORATORY DATA:  WBCs of 7.3, hemoglobin 15.4, platelets 192.  Sodium 139, potassium 3.8, chloride 101, bicarb 25, BUN 9, creatinine 0.63, glucose of 98, bilirubin of 0.7, alkaline phosphatase of 44, AST 13, ALT 12, protein 7.4, albumin 3.5, calcium 9.8.  CK 41, CK-MB 2.3, troponin-I less than 0.3.  IMAGING: 1. Chest x-ray, impression:  Right base opacity is indeterminate.     This may represent a loculated pleural fluid, air space     consolidation of pulmonary mass.  Radiographic followup advise. 2. CT angiogram of the chest, impression:     a.     Evidence of probable subacute pulmonary emboli in the left      upper and lower lobes.     b.     Secondary pulmonary infarcts in the right upper and lower      lobes.     c.     The spiculated area in the right lower lobe needs to be      followed up in 30 days to check for improvement since this could      represent a malignancy, although it is felt to be less likely.  ASSESSMENT AND PLAN: 1. Dyspnea secondary to pulmonary emboli.  The patient had the CT     angiogram of the chest with the above findings noted.  At this     time, we will admit to the step-down unit given the patient's     initial heart rate in the  130s-140s.  We will provide supplemental     oxygen to maintain oxygen saturation above 88%.  We will start     anticoagulation with Lovenox 1 mg/kg subcu q.12 h along with     warfarin p.o. for a 5-day overlap and/or until INR is greater than     2.  Currently, there is no signs of volume overload or diastolic     congestive heart failure exacerbation, and therefore we will not     diurese.  Furthermore, the patient has COPD, but is not currently     wheezing  and given his tachycardia from the PE will avoid beta-2     agonists. 2. Sinus tachycardia.  As mentioned above in the electrocardiogram     findings, the patient presented with a sinus tachycardia and a     ventricular rate of approximately 133 beats per minute.  He does     have a chronic right bundle-branch block, which was initially     interpreted as a wide complex tachycardia and he was started on     amiodarone.  Given the fact that this is a chronic right bundle     with current sinus tachycardia in the setting of a PE, we will     discontinue the amiodarone and continue with his home medications.     We will go ahead and cycle cardiac enzymes just to ensure that     there is no other cardiac event going on, although cardiac enzymes     can be elevated in the setting of PE and right ventricular strain. 3. Spiculated area in the right lower lobe on CT scan.  As mentioned     above, this area needs to be followed up in approximately 30 days     to assess for improvement as this can represent a malignancy,     however, the radiologist feels that this is felt to be less likely     a malignancy.  However, followup with further radiography needs to     be an completed. 4. Chronic obstructive pulmonary disease.  Given the patient's     physical findings, we will continue with his home inhalers. 5. History of the atrial fibrillation/flutter.  The patient is     currently not on anticoagulation aside from an aspirin.  However,      given problem #1, we will continue with full-dose Lovenox as well     as initiation of Coumadin.  As mentioned, the patient is currently     in sinus tach and likely secondary to his pulmonary embolus, we     will go ahead and continue home medications including Cardizem, but     will tolerate the sinus tachycardia at its current rate.  CODE STATUS:  The patient is a full code.  As mentioned above, if need for medical decision making arises, his brother, Konrad Hoak should be contacted and the phone number is listed above.     Mariea Stable, MD     MA/MEDQ  D:  02/08/2011  T:  02/08/2011  Job:  [id number]  cc:   Delaney Meigs, M.D. Rollene Rotunda, MD, [facility] Charlaine Dalton. Sherene Sires, MD, [facility]  Electronically Signed by Mariea Stable MD on 02/25/2011 03:34:25 PM

## 2011-03-03 ENCOUNTER — Other Ambulatory Visit: Payer: Self-pay | Admitting: Orthopedic Surgery

## 2011-03-03 DIAGNOSIS — M545 Low back pain: Secondary | ICD-10-CM

## 2011-03-08 ENCOUNTER — Ambulatory Visit
Admission: RE | Admit: 2011-03-08 | Discharge: 2011-03-08 | Disposition: A | Discharge status: Home / Self Care | Payer: Managed Care, Other (non HMO) | Source: Ambulatory Visit | Attending: Orthopedic Surgery | Admitting: Orthopedic Surgery

## 2011-03-08 DIAGNOSIS — M545 Low back pain: Secondary | ICD-10-CM

## 2011-03-11 ENCOUNTER — Telehealth: Payer: Self-pay | Admitting: *Deleted

## 2011-03-11 ENCOUNTER — Ambulatory Visit (INDEPENDENT_AMBULATORY_CARE_PROVIDER_SITE_OTHER): Payer: Managed Care, Other (non HMO) | Admitting: Internal Medicine

## 2011-03-11 ENCOUNTER — Encounter: Payer: Self-pay | Admitting: Internal Medicine

## 2011-03-11 DIAGNOSIS — J984 Other disorders of lung: Secondary | ICD-10-CM

## 2011-03-11 DIAGNOSIS — J449 Chronic obstructive pulmonary disease, unspecified: Secondary | ICD-10-CM

## 2011-03-11 DIAGNOSIS — R911 Solitary pulmonary nodule: Secondary | ICD-10-CM

## 2011-03-11 NOTE — Assessment & Plan Note (Signed)
Back to baseline and not limited by breathing  As I explained to this patient in detail:  although there may be significant copd present, it does not appear to be limiting activity tolerance any more than a set of worn tires limits someone from driving a car  around a parking lot.  A new set of Michelins might look good but would have no perceived impact on the performance of the car and would not be worth the cost.  That is to say:   this pt is so sedentary I don't recommend aggressive pulmonary rx at this point unless limiting symptoms arise or acute exacerbations become as issue, neither of which are the case now.  I asked the patient to contact this office at any time in the future should either of these problems arise.    The proper method of use, as well as anticipated side effects, of this metered-dose inhaler are discussed and demonstrated to the patient. Improved to 90% with coaching.

## 2011-03-11 NOTE — Patient Instructions (Addendum)
Ok to use symbicort 2 puffs every 12 hours as need when your breathing is limiting you from desired activities  We will place your chart in computer for a recall cxr  1st of  oct 2012 with office visit same day in [place]

## 2011-03-11 NOTE — Telephone Encounter (Signed)
Message copied by Gwynneth Albright on Tue Mar 11, 2011 12:19 PM ------      Message from: Humberto Seals      Created: Tue Mar 11, 2011 11:32 AM      Regarding: FW: ct chest       Since leslie is not here can you guys put this order in for me thanks      ----- Message -----         From: Sandrea Hughs, MD         Sent: 03/11/2011  10:52 AM           To: Humberto Seals      Subject: ct chest                                                 Schedule CT of Chest in [place] with contrast to follow up nodule

## 2011-03-11 NOTE — Assessment & Plan Note (Addendum)
Discussed in detail all the  indications, usual  risks and alternatives  relative to the benefits with patient who agrees to proceed with conservative f/u with cxr first and see if the new area ( which is clearly separate from the first and seen easily on plain film) is clearing c/w inflammation or infarct.  If not, then a pet scan is the next logical step.

## 2011-03-11 NOTE — Progress Notes (Signed)
Subjective:     Patient ID: Francisco Tanner, male   DOB: [birth year], 65 y.o.   MRN: [id number]  HPI 31  yowm quit smoking 2004 with GOLD III COPD by PFT's   Admit [facility] 1/21 -29/2010 with 1  year orthopnea/pnd then gradual anasarca and caf > coumadin/ cardioversion 08/23/08 > marked improvement, discharged on 02 but not using.   September 08, 2008 post hosp fu no problem with doe walked at [facility] 2/11 did ok except legs weak. no cough. rec use spiriva as maint plus as needed albuterol, feels the albuterol really makes a difference.   October 09, 2008 returns for PFTs as requested stating he still feels the need for using albuterol at least two or 3 times a week. However, he is more limited by orthopedic constraints at this point than dyspnea.  rec: stop spiriva - think of it like high octane fuel - it will give you better perfomormance if used regularly each am but you may not need it.  Try Symbicort 160 2 puffs first thing in am and 2 puffs again in pm about 12 hours later and work on perfecting technique  Please schedule a follow-up appointment in 6 weeks, sooner if needed  use 02 at bedtime automatically as needed during the day   November 20, 2008 better, just using symbicort in am and no spiriva at all, no other rx. no change in rx.   Mar 12, 2009 [facility] admit [facility]   April 23, 2009 post op f/u f/u doing outpt rehab  rec  Work on inhaler technique: relax and blow all the way out then take a nice smooth deep breath back in, triggering the inhaler at same time you start breathing in  wear 02 automatically at bedtime for now at 2 lpm    August 20, 2009 does fine flat on flat surface trouble with inclines, no cough, no cp. Only uses symbicort 160 2 puffs each am no prns or pm use of symbicort. rec no change rx   Admit [facility]  DATE OF ADMISSION: 02/07/2011  DATE OF DISCHARGE: 02/13/2011   DISCHARGE DIAGNOSES:  1. Recurrent atrial flutter status TEE guided cardioversion.  2. Pulmonary  embolism/pulmonary infarct and shortness of breath.  3. Chronic obstructive pulmonary disease.  4. 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  5. Pulmonary hypertension.  6. Benign prostatic hypertrophy and bladder spasm.  7. Chronic back pain.  8. Diastolic and systolic congestive heart failure, chronic, stable  and currently compensated.  9. History of left hip arthroplasty.  10.Insomnia.  11.A spiculated area on the patient's right lung seen on CT that will  require further radiologic studies for follow-up over the next 30  to 60 days.  03/11/2011 ov/Wert cc doe back to baseline, not limiting him from desired activities uses HC parking very little walking more than that, only using symbicort in am,  Sleeping ok without nocturnal  or early am exac of resp c/o's or need for noct saba.  Pt denies any significant sore throat, dysphagia, itching, sneezing,  nasal congestion or excess/ purulent secretions,  fever, chills, sweats, unintended wt loss, pleuritic or exertional cp, hempoptysis, orthopnea pnd or leg swelling.    Also denies any obvious fluctuation of symptoms with weather or environmental changes or other aggravating or alleviating factors.     Past Medical History:  ATRIAL FLUTTER (ICD-427.32)  -Echo 09/17/2008 nl ef, mild lae, rv mod dilated, pasystolic 46  -Noct desat 02/28/2009, refused 02 @[facility]   COPD..............................................................................................Marland KitchenWert  - PFTs 10/09/08 FEV1 1.39 ( 44%)ratio 40, DLCO 63%. 16% improvement after bronchodilator  - Overnight pulse ox 02/28/2009 5:51m spent with sat < 89 sleeping > declined further 02 July 05, 2009  - HFA 75% April 23, 2009  SPN right mid zone  - first detected 08/16/08  - See CT 08/16/08 [facility] > see repeat CT 02/07/11 Weight Gain  - Target wt = 196 for BMI < 30   Review of Systems     Objective:   Physical Exam wt 214 September 08, 2008 > 228 February 16, 2009 >  >  232 August 20, 2009 > 214 03/11/2011  HEENT mild turbinate edema. Oropharynx no thrush or excess pnd or cobblestoning. No JVD or cervical adenopathy. Mild accessory muscle hypertrophy. Trachea midline, nl thryroid. Chest was hyperinflated by percussion with diminished breath sounds and marked increased exp time without wheeze. Hoover sign positive onset of inspiration. Regular rate and rhythm without murmur gallop or rub or increase P2. No edema. Decrease s1s2 Abd: no hsm, nl excursion. Ext warm without Cynosis or clubbing    Assessment:         Plan:

## 2011-03-14 ENCOUNTER — Other Ambulatory Visit: Payer: Managed Care, Other (non HMO)

## 2011-04-04 ENCOUNTER — Telehealth: Payer: Self-pay | Admitting: *Deleted

## 2011-04-04 DIAGNOSIS — J984 Other disorders of lung: Secondary | ICD-10-CM

## 2011-04-04 DIAGNOSIS — J449 Chronic obstructive pulmonary disease, unspecified: Secondary | ICD-10-CM

## 2011-04-04 NOTE — Telephone Encounter (Signed)
Returning call can be reached at [phone].Raylene Everts

## 2011-04-04 NOTE — Telephone Encounter (Signed)
Message copied by Christen Butter on Fri Apr 04, 2011 11:25 AM ------      Message from: Christen Butter      Created: Mon Mar 17, 2011  5:40 PM       Pt needs ov with cxr due 04/29/11

## 2011-04-04 NOTE — Telephone Encounter (Signed)
Pt is coming in 10/1 at 10:30 pt aware to arrive 15 minutes early for cxr. Nothing further was needed

## 2011-04-04 NOTE — Telephone Encounter (Signed)
ATC pt on both home and cell numbers. NA at either number and no option to leave a msg, WCB.

## 2011-04-25 ENCOUNTER — Other Ambulatory Visit: Payer: Self-pay | Admitting: Internal Medicine

## 2011-04-25 DIAGNOSIS — J449 Chronic obstructive pulmonary disease, unspecified: Secondary | ICD-10-CM

## 2011-04-25 DIAGNOSIS — J984 Other disorders of lung: Secondary | ICD-10-CM

## 2011-04-28 ENCOUNTER — Ambulatory Visit (INDEPENDENT_AMBULATORY_CARE_PROVIDER_SITE_OTHER): Payer: Managed Care, Other (non HMO) | Admitting: Internal Medicine

## 2011-04-28 ENCOUNTER — Encounter: Payer: Self-pay | Admitting: Internal Medicine

## 2011-04-28 ENCOUNTER — Ambulatory Visit (INDEPENDENT_AMBULATORY_CARE_PROVIDER_SITE_OTHER)
Admission: RE | Admit: 2011-04-28 | Discharge: 2011-04-28 | Disposition: A | Discharge status: Home / Self Care | Payer: Managed Care, Other (non HMO) | Source: Ambulatory Visit | Attending: Internal Medicine | Admitting: Internal Medicine

## 2011-04-28 VITALS — BP 136/80 | HR 82 | Temp 97.8°F | Ht 67.5 in | Wt 208.0 lb

## 2011-04-28 DIAGNOSIS — J984 Other disorders of lung: Secondary | ICD-10-CM

## 2011-04-28 DIAGNOSIS — J449 Chronic obstructive pulmonary disease, unspecified: Secondary | ICD-10-CM

## 2011-04-28 NOTE — Patient Instructions (Signed)
Please schedule a follow up visit in 3 months but call sooner if needed with cxr on return to the [place] office

## 2011-04-28 NOTE — Progress Notes (Signed)
Subjective:     Patient ID: Francisco Tanner, male   DOB: [birth year], 65 y.o.   MRN: [id number]  HPI 35  yowm quit smoking 2004 with GOLD III COPD by PFT's 2010   Admit [facility] 1/21 -29/2010 with 1  year orthopnea/pnd then gradual anasarca and caf > coumadin/ cardioversion 08/23/08 > marked improvement, discharged on 02 but not using.   September 08, 2008 post hosp fu no problem with doe walked at [facility] 2/11 did ok except legs weak. no cough. rec use spiriva as maint plus as needed albuterol, feels the albuterol really makes a difference.   October 09, 2008 returns for PFTs as requested stating he still feels the need for using albuterol at least two or 3 times a week. However, he is more limited by orthopedic constraints at this point than dyspnea.  rec: stop spiriva - think of it like high octane fuel - it will give you better perfomormance if used regularly each am but you may not need it.  Try Symbicort 160 2 puffs first thing in am and 2 puffs again in pm about 12 hours later and work on perfecting technique  Please schedule a follow-up appointment in 6 weeks, sooner if needed  use 02 at bedtime automatically as needed during the day   November 20, 2008 better, just using symbicort in am and no spiriva at all, no other rx. no change in rx.   Mar 12, 2009 [facility] admit for [facility]   April 23, 2009 post op f/u f/u doing outpt rehab  rec  Work on inhaler technique: relax and blow all the way out then take a nice smooth deep breath back in, triggering the inhaler at same time you start breathing in  wear 02 automatically at bedtime for now at 2 lpm    August 20, 2009 does fine flat on flat surface trouble with inclines, no cough, no cp. Only uses symbicort 160 2 puffs each am no prns or pm use of symbicort. rec no change rx   Admit [facility]  DATE OF ADMISSION: 02/07/2011  DATE OF DISCHARGE: 02/13/2011   DISCHARGE DIAGNOSES:  1. Recurrent atrial flutter status TEE guided cardioversion.  2.  Pulmonary embolism/pulmonary infarct and shortness of breath.  3. Chronic obstructive pulmonary disease.  4. 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  5. Pulmonary hypertension.  6. Benign prostatic hypertrophy and bladder spasm.  7. Chronic back pain.  8. Diastolic and systolic congestive heart failure, chronic, stable  and currently compensated.  9. History of left hip arthroplasty.  10.Insomnia.  11.A spiculated area on the patient's right lung seen on CT that will  require further radiologic studies for follow-up over the next 30  to 60 days.  03/11/2011 ov/Wert cc doe back to baseline, not limiting him from desired activities uses [facility] parking very little walking more than that, only using symbicort in am,  rec Ok to use symbicort 2 puffs every 12 hours as need when your breathing is limiting you from desired activities   04/28/2011 f/u ov/Wert cc breathing ok though not very active. No cough    Sleeping ok without nocturnal  or early am exac of resp c/o's or need for noct saba.  Pt denies any significant sore throat, dysphagia, itching, sneezing,  nasal congestion or excess/ purulent secretions,  fever, chills, sweats, unintended wt loss, pleuritic or exertional cp, hempoptysis, orthopnea pnd or leg swelling.    Also denies any obvious fluctuation of symptoms with weather or  environmental changes or other aggravating or alleviating factors.     Past Medical History:  ATRIAL FLUTTER (ICD-427.32)  -Echo 09/17/2008 nl ef, mild lae, rv mod dilated, pasystolic 46  -Noct desat 02/28/2009, refused 02 @[facility]   COPD..............................................................................................Marland KitchenWert  - PFTs 10/09/08 FEV1 1.39 ( 44%)ratio 40, DLCO 63%. 16% improvement after bronchodilator  - Overnight pulse ox 02/28/2009 5:63m spent with sat < 89 sleeping > declined further 02 July 05, 2009  - HFA 75% April 23, 2009  SPN right mid zone  - first detected  08/16/08  - See CT 08/16/08 [facility] > see repeat CT 02/07/11 Weight Gain  - Target wt = 196 for BMI < 30   Review of Systems     Objective:   Physical Exam wt 214 September 08, 2008 > 228 February 16, 2009 >  > 232 August 20, 2009 > 214 03/11/2011 > 208 04/28/2011  HEENT mild turbinate edema. Oropharynx no thrush or excess pnd or cobblestoning. No JVD or cervical adenopathy. Mild accessory muscle hypertrophy. Trachea midline, nl thryroid. Chest was hyperinflated by percussion with diminished breath sounds and marked increased exp time without wheeze. Hoover sign positive onset of inspiration. Regular rate and rhythm without murmur gallop or rub or increase P2. No edema. Decrease s1s2 Abd: no hsm, nl excursion. Ext warm without Cynosis or clubbing    CXR  04/28/2011 :  1. Decrease in size in the right lower lobe mass. Continued radiographic follow-up is recommended. This probably represents pulmonary parenchymal scar associated with previous pulmonary infarction. 2. Cardiomegaly and enlargement of the pulmonary arteries suggesting pulmonary arterial hypertension. 3. Unchanged right upper lobe scar. 4. Emphysema.   Assessment:         Plan:

## 2011-04-29 ENCOUNTER — Encounter: Payer: Self-pay | Admitting: Internal Medicine

## 2011-04-29 NOTE — Assessment & Plan Note (Signed)
Discussed in detail all the  indications, usual  risks and alternatives  relative to the benefits with patient who agrees to proceed with continued conservative rx

## 2011-04-29 NOTE — Assessment & Plan Note (Signed)
Adequate control on present rx, reviewed     Each maintenance medication was reviewed in detail including most importantly the difference between maintenance and as needed and under what circumstances the prns are to be used.  Please see instructions for details which were reviewed in writing and the patient given a copy.   

## 2011-05-21 ENCOUNTER — Other Ambulatory Visit (HOSPITAL_COMMUNITY): Payer: Self-pay | Admitting: Interventional Radiology

## 2011-05-21 ENCOUNTER — Telehealth: Payer: Self-pay | Admitting: Cardiology

## 2011-05-21 DIAGNOSIS — IMO0002 Reserved for concepts with insufficient information to code with codable children: Secondary | ICD-10-CM

## 2011-05-21 NOTE — Telephone Encounter (Signed)
I spoke with Francisco Tanner and the pt is scheduled for a vertebroplasty on 05/28/11 due to fracture of lumbar spine.  Dr Corliss Skains is performing procedure and the hospital needs clearance for pt to come off of coumadin 5 days prior to procedure. The pt would have to stop coumadin on 05/22/11.  I will forward this message to [facility] RN and Dr Antoine Poche.

## 2011-05-21 NOTE — Telephone Encounter (Signed)
Per Francisco Tanner pt need to be off coumadin - 5 day prior due to surgery on 10/31.

## 2011-05-21 NOTE — Telephone Encounter (Signed)
Given the history of pulmonary emboli and atrial flutter I think that bridging with Lovenox would be indicated.  However, we do not manage his warfarin.

## 2011-05-21 NOTE — Telephone Encounter (Signed)
Dr Ramos---I left a message for [name] to call our office back with the details of what procedure the patient is having done.

## 2011-05-22 NOTE — Telephone Encounter (Signed)
Left message for Francisco Tanner that pt needs to be bridged with Lovenox d/t his history per Dr Antoine Poche.  Requested she call back if further questions and or concerns.

## 2011-05-28 ENCOUNTER — Other Ambulatory Visit: Payer: Self-pay | Admitting: Interventional Radiology

## 2011-05-28 ENCOUNTER — Ambulatory Visit (HOSPITAL_COMMUNITY)
Admission: RE | Admit: 2011-05-28 | Discharge: 2011-05-28 | Disposition: A | Discharge status: Home / Self Care | Payer: Managed Care, Other (non HMO) | Source: Ambulatory Visit | Attending: Interventional Radiology | Admitting: Interventional Radiology

## 2011-05-28 ENCOUNTER — Other Ambulatory Visit (HOSPITAL_COMMUNITY): Payer: Self-pay | Admitting: Interventional Radiology

## 2011-05-28 DIAGNOSIS — IMO0002 Reserved for concepts with insufficient information to code with codable children: Secondary | ICD-10-CM

## 2011-05-28 DIAGNOSIS — M8448XA Pathological fracture, other site, initial encounter for fracture: Secondary | ICD-10-CM | POA: Insufficient documentation

## 2011-05-28 LAB — CBC
HCT: 44.8 % (ref 39.0–52.0)
Hemoglobin: 14.5 g/dL (ref 13.0–17.0)
MCV: 96.3 fL (ref 78.0–100.0)
WBC: 5.5 10*3/uL (ref 4.0–10.5)

## 2011-05-28 LAB — BASIC METABOLIC PANEL
CO2: 28 mEq/L (ref 19–32)
Chloride: 103 mEq/L (ref 96–112)
GFR calc Af Amer: >90 mL/min (ref >90)
Potassium: 3.9 mEq/L (ref 3.5–5.1)

## 2011-05-28 LAB — APTT: aPTT: 29 seconds (ref 24–37)

## 2011-05-28 LAB — PROTIME-INR: INR: 1.26 (ref 0.00–1.49)

## 2011-05-28 MED ORDER — IOHEXOL 300 MG/ML  SOLN: 50.0000 mL | Freq: Once | INTRAMUSCULAR | Status: AC | PRN

## 2011-05-30 ENCOUNTER — Other Ambulatory Visit (HOSPITAL_COMMUNITY): Payer: Self-pay | Admitting: Interventional Radiology

## 2011-05-30 DIAGNOSIS — IMO0002 Reserved for concepts with insufficient information to code with codable children: Secondary | ICD-10-CM

## 2011-06-11 ENCOUNTER — Telehealth: Payer: Self-pay | Admitting: *Deleted

## 2011-06-11 ENCOUNTER — Ambulatory Visit (HOSPITAL_COMMUNITY): Admission: RE | Admit: 2011-06-11 | Payer: Managed Care, Other (non HMO) | Source: Ambulatory Visit

## 2011-06-11 NOTE — Telephone Encounter (Signed)
I called and LMTCB with Louann at [place] to see if we can have this pt do a cxr at their office for Dr. Sherene Sires even though he is not their patient. Philipp Deputy is aware of this as well. Carron Curie, CMA

## 2011-06-16 NOTE — Telephone Encounter (Signed)
I spoke with Philipp Deputy and she states she did speak with Francisco Tanner and they will do CXR for Dr. Sherene Sires. I will refax order. Carron Curie, CMA

## 2011-07-01 ENCOUNTER — Ambulatory Visit (INDEPENDENT_AMBULATORY_CARE_PROVIDER_SITE_OTHER): Payer: Managed Care, Other (non HMO) | Admitting: Internal Medicine

## 2011-07-01 ENCOUNTER — Encounter: Payer: Self-pay | Admitting: Internal Medicine

## 2011-07-01 DIAGNOSIS — J449 Chronic obstructive pulmonary disease, unspecified: Secondary | ICD-10-CM

## 2011-07-01 NOTE — Progress Notes (Signed)
Subjective:     Patient ID: Francisco Tanner, male   DOB: 02-11-1946, 65 y.o.   MRN: [id number]  HPI 66  yowm quit smoking 2004 with GOLD III COPD by PFT's 2010   Admit [facility] 1/21 -29/2010 with 1  year orthopnea/pnd then gradual anasarca and caf > coumadin/ cardioversion 08/23/08 > marked improvement, discharged on 02 but not using.   September 08, 2008 post hosp fu no problem with doe walked at [facility] 2/11 did ok except legs weak. no cough. rec use spiriva as maint plus as needed albuterol, feels the albuterol really makes a difference.   October 09, 2008 returns for PFTs as requested stating he still feels the need for using albuterol at least two or 3 times a week. However, he is more limited by orthopedic constraints at this point than dyspnea.  rec: stop spiriva - think of it like high octane fuel - it will give you better perfomormance if used regularly each am but you may not need it.  Try Symbicort 160 2 puffs first thing in am and 2 puffs again in pm about 12 hours later and work on perfecting technique  Please schedule a follow-up appointment in 6 weeks, sooner if needed  use 02 at bedtime automatically as needed during the day   November 20, 2008 better, just using symbicort in am and no spiriva at all, no other rx. no change in rx.   Mar 12, 2009 wlh admit for [facility]   April 23, 2009 post op f/u f/u doing outpt rehab  rec  Work on inhaler technique: relax and blow all the way out then take a nice smooth deep breath back in, triggering the inhaler at same time you start breathing in  wear 02 automatically at bedtime for now at 2 lpm    August 20, 2009 does fine flat on flat surface trouble with inclines, no cough, no cp. Only uses symbicort 160 2 puffs each am no prns or pm use of symbicort. rec no change rx   Admit [facility]  DATE OF ADMISSION: 02/07/2011  DATE OF DISCHARGE: 02/13/2011   DISCHARGE DIAGNOSES:  1. Recurrent atrial flutter status TEE guided cardioversion.  2.  Pulmonary embolism/pulmonary infarct and shortness of breath.  3. Chronic obstructive pulmonary disease.  4. 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  5. Pulmonary hypertension.  6. Benign prostatic hypertrophy and bladder spasm.  7. Chronic back pain.  8. Diastolic and systolic congestive heart failure, chronic, stable  and currently compensated.  9. History of left hip arthroplasty.  10.Insomnia.  11.A spiculated area on the patient's right lung seen on CT that will  require further radiologic studies for follow-up over the next 30  to 60 days.  03/11/2011 ov/Wert cc doe back to baseline, not limiting him from desired activities uses [facility] parking very little walking more than that, only using symbicort in am,  rec Ok to use symbicort 2 puffs every 12 hours as need when your breathing is limiting you from desired activities    07/01/2011 f/u ov/Wert [place] office cc  Indolent onset sob x months but only when bending over, activity limited by back and knee pain, not sob.  No cough, assoc with progressive wt gain he attributes to immobility.     Sleeping ok without nocturnal  or early am exac of resp c/o's or need for noct saba.  Pt denies any significant sore throat, dysphagia, itching, sneezing,  nasal congestion or excess/ purulent secretions,  fever,  chills, sweats, unintended wt loss, pleuritic or exertional cp, hempoptysis, orthopnea pnd or leg swelling.    Also denies any obvious fluctuation of symptoms with weather or environmental changes or other aggravating or alleviating factors.     Past Medical History:  ATRIAL FLUTTER (ICD-427.32)  -Echo 09/17/2008 nl ef, mild lae, rv mod dilated, pasystolic 46  -Noct desat 02/28/2009, refused 02 @[facility]   COPD..............................................................................................Marland KitchenWert  - PFTs 10/09/08 FEV1 1.39 ( 44%)ratio 40, DLCO 63%. 16% improvement after bronchodilator  - Overnight pulse ox  02/28/2009 5:67m spent with sat < 89 sleeping > declined further 02 July 05, 2009  - HFA 75% April 23, 2009  SPN right mid zone  - first detected 08/16/08  - See CT 08/16/08 [facility] > see repeat CT 02/07/11  Weight Gain  - Target wt = 196 for BMI < 30        Objective:   Physical Exam wt 214 September 08, 2008 > 228 February 16, 2009 >  > 232 August 20, 2009 > 214 03/11/2011 > 208 04/28/2011 > 222 07/01/2011  HEENT mild turbinate edema. Oropharynx no thrush or excess pnd or cobblestoning. No JVD or cervical adenopathy. Mild accessory muscle hypertrophy. Trachea midline, nl thryroid. Chest was hyperinflated by percussion with diminished breath sounds and marked increased exp time without wheeze. Hoover sign positive onset of inspiration. Regular rate and rhythm without murmur gallop or rub or increase P2. No edema. Decrease s1s2 Abd: no hsm, nl excursion. Ext warm without Cynosis or clubbing    CXR  07/01/2011 :  Minimal band like atx RLL / RML, no mass or effusion   Assessment:         Plan:

## 2011-07-01 NOTE — Assessment & Plan Note (Signed)
DDX of  difficult airways managment all start with A and  include Adherence, Ace Inhibitors, Acid Reflux, Active Sinus Disease, Alpha 1 Antitripsin deficiency, Anxiety masquerading as Airways dz,  ABPA,  allergy(esp in young), Aspiration (esp in elderly), Adverse effects of DPI,  Active smokers, plus two Bs  = Bronchiectasis and Beta blocker use..and one C= CHF  Adherence is always the initial "prime suspect" and is a multilayered concern that requires a "trust but verify" approach in every patient - starting with knowing how to use medications, especially inhalers, correctly, keeping up with refills and understanding the fundamental difference between maintenance and prns vs those medications only taken for a very short course and then stopped and not refilled.    Each maintenance medication was reviewed in detail including most importantly the difference between maintenance and as needed and under what circumstances the prns are to be used.  Please see instructions for details which were reviewed in writing and the patient given a copy.  He clearly is not using his symbicort as much as he should be but on the other hand his chief complaint of sob bending over is likely related to obesity, not airways dz.

## 2011-07-01 NOTE — Patient Instructions (Signed)
Please schedule a follow up visit in 6 months but call sooner if needed if breathing worsens  Weight control is simply a matter of calorie balance which needs to be tilted in your favor by eating less and exercising more.  To get the most out of exercise, you need to be continuously aware that you are short of breath, but never out of breath, for 30 minutes daily. As you improve, it will actually be easier for you to do the same amount of exercise  in  30 minutes so always push to the level where you are short of breath.  If this does not result in gradual weight reduction then I strongly recommend you see a nutritionist with a food diary x 2 weeks so that we can work out a negative calorie balance which is universally effective in steady weight loss programs.  Think of your calorie balance like you do your bank account where in this case you want the balance to go down so you must take in less calories than you burn up.  It's just that simple:  Hard to do, but easy to understand.  Good luck!

## 2011-09-19 ENCOUNTER — Telehealth: Payer: Self-pay | Admitting: Internal Medicine

## 2011-09-19 NOTE — Telephone Encounter (Signed)
Per MW- cxr okay and no changes in tx needed.  Spoke with pt and notified of results per Dr. Sherene Sires. Pt verbalized understanding and denied any questions.

## 2011-09-20 ENCOUNTER — Encounter: Payer: Self-pay | Admitting: Orthopedic Surgery

## 2011-09-20 ENCOUNTER — Emergency Department (HOSPITAL_COMMUNITY)
Admission: EM | Admit: 2011-09-20 | Discharge: 2011-09-20 | Disposition: A | Discharge status: Home / Self Care | Payer: Medicare Other | Attending: Emergency Medicine | Admitting: Emergency Medicine

## 2011-09-20 ENCOUNTER — Emergency Department (HOSPITAL_COMMUNITY): Payer: Medicare Other

## 2011-09-20 ENCOUNTER — Encounter (HOSPITAL_COMMUNITY): Payer: Self-pay | Admitting: *Deleted

## 2011-09-20 DIAGNOSIS — I4892 Unspecified atrial flutter: Secondary | ICD-10-CM | POA: Insufficient documentation

## 2011-09-20 DIAGNOSIS — S73006A Unspecified dislocation of unspecified hip, initial encounter: Secondary | ICD-10-CM

## 2011-09-20 DIAGNOSIS — M25559 Pain in unspecified hip: Secondary | ICD-10-CM | POA: Insufficient documentation

## 2011-09-20 DIAGNOSIS — X58XXXA Exposure to other specified factors, initial encounter: Secondary | ICD-10-CM | POA: Insufficient documentation

## 2011-09-20 DIAGNOSIS — I10 Essential (primary) hypertension: Secondary | ICD-10-CM | POA: Insufficient documentation

## 2011-09-20 DIAGNOSIS — Z7901 Long term (current) use of anticoagulants: Secondary | ICD-10-CM | POA: Insufficient documentation

## 2011-09-20 DIAGNOSIS — Z96649 Presence of unspecified artificial hip joint: Secondary | ICD-10-CM | POA: Insufficient documentation

## 2011-09-20 DIAGNOSIS — T84029A Dislocation of unspecified internal joint prosthesis, initial encounter: Secondary | ICD-10-CM | POA: Insufficient documentation

## 2011-09-20 HISTORY — DX: Essential (primary) hypertension: I10

## 2011-09-20 LAB — DIFFERENTIAL
Basophils Absolute: 0 10*3/uL (ref 0.0–0.1)
Lymphocytes Relative: 10 % — ABNORMAL LOW (ref 12–46)
Lymphs Abs: 0.8 10*3/uL (ref 0.7–4.0)
Monocytes Absolute: 0.6 10*3/uL (ref 0.1–1.0)
Monocytes Relative: 8 % (ref 3–12)
Neutro Abs: 6.7 10*3/uL (ref 1.7–7.7)

## 2011-09-20 LAB — BASIC METABOLIC PANEL
CO2: 24 mEq/L (ref 19–32)
Chloride: 102 mEq/L (ref 96–112)
Creatinine, Ser: 0.57 mg/dL (ref 0.50–1.35)
Glucose, Bld: 116 mg/dL — ABNORMAL HIGH (ref 70–99)

## 2011-09-20 LAB — URINALYSIS, ROUTINE W REFLEX MICROSCOPIC
Hgb urine dipstick: NEGATIVE
Nitrite: NEGATIVE
Specific Gravity, Urine: 1.02 (ref 1.005–1.030)
Urobilinogen, UA: 0.2 mg/dL (ref 0.0–1.0)
pH: 5.5 (ref 5.0–8.0)

## 2011-09-20 LAB — PROTIME-INR: Prothrombin Time: 57.9 seconds — ABNORMAL HIGH (ref 11.6–15.2)

## 2011-09-20 LAB — CBC
HCT: 36.2 % — ABNORMAL LOW (ref 39.0–52.0)
Hemoglobin: 11.7 g/dL — ABNORMAL LOW (ref 13.0–17.0)
MCV: 90.3 fL (ref 78.0–100.0)
RBC: 4.01 MIL/uL — ABNORMAL LOW (ref 4.22–5.81)
WBC: 8.1 10*3/uL (ref 4.0–10.5)

## 2011-09-20 MED ORDER — PROPOFOL 10 MG/ML IV EMUL
INTRAVENOUS | Status: AC | PRN
  Administered 2011-09-20: 10 mL via INTRAVENOUS

## 2011-09-20 MED ORDER — PROPOFOL 10 MG/ML IV EMUL
INTRAVENOUS | Status: DC | PRN
  Administered 2011-09-20: 5 mL via INTRAVENOUS

## 2011-09-20 MED ORDER — OXYCODONE-ACETAMINOPHEN 5-325 MG PO TABS
2.0000 | ORAL_TABLET | Freq: Once | ORAL | Status: AC
  Administered 2011-09-20: 2 via ORAL
  Filled 2011-09-20: qty 2

## 2011-09-20 MED ORDER — PROPOFOL BOLUS VIA INFUSION: 100.0000 mg | Freq: Once | INTRAVENOUS | Status: DC

## 2011-09-20 MED ORDER — OXYCODONE-ACETAMINOPHEN 5-325 MG PO TABS: 2.0000 | ORAL_TABLET | ORAL | Status: DC | PRN

## 2011-09-20 MED ORDER — PROPOFOL 10 MG/ML IV EMUL
10.0000 mL | Freq: Once | INTRAVENOUS | Status: DC
  Filled 2011-09-20: qty 20

## 2011-09-20 MED ORDER — PROPOFOL 10 MG/ML IV EMUL
INTRAVENOUS | Status: AC
  Filled 2011-09-20: qty 20

## 2011-09-20 MED ORDER — HYDROMORPHONE HCL PF 1 MG/ML IJ SOLN
1.0000 mg | Freq: Once | INTRAMUSCULAR | Status: AC
  Administered 2011-09-20: 1 mg via INTRAVENOUS
  Filled 2011-09-20: qty 1

## 2011-09-20 NOTE — ED Notes (Signed)
Xray to bedside.

## 2011-09-20 NOTE — ED Notes (Signed)
Assumed care of patient at this time. No new changes in assessment. Patient requesting water; provided with mouth swabs for oral care, but is to remain NPO until further notice from MD.

## 2011-09-20 NOTE — ED Notes (Signed)
Pt back from ct

## 2011-09-20 NOTE — ED Provider Notes (Signed)
History     CSN: [id number]  Arrival date & time 09/20/11  1732   First MD Initiated Contact with Patient 09/20/11 1740      Chief Complaint  Patient presents with  . Fall    (Consider location/radiation/quality/duration/timing/severity/associated sxs/prior treatment) HPI History provided by pt.   Pt was standing up from his chair this afternoon when his left hip gave out on hip.  Developed severe pain immediately afterwards.  He did not fall but eased himself to the ground and has been unable to bear weight on LLE since.  Pain associated w/ weakness; no paresthesias.  No recent trauma.  Pt has h/o total hip arthroplasty.  Most recent surgery was a left hip bursectomy, performed by Dr. Lequita Halt in 05/2010.  Has chronic hip pain but not usually this severe.    Past Medical History  Diagnosis Date  . CHF (congestive heart failure)   . COPD (chronic obstructive pulmonary disease)     Wert. PFTs 10/09/08 FEV1 1.39 (44%) ratio 40, DLCO 63%. 16% improvement after bronchodilator. overnight pulse ox 02/28/09 5:32m pent with sat< 89 sleeping > declined further o2 07/05/09. HFA 75% 04/23/09.   [name] Atrial flutter     echo 09/17/08 nl EF, mild LAE, RV mod dilated, pasystolic 46. noct desat 02/28/09, refused 02 @ hs   . Abnormal CT scan     SPN right mid zone. first detected 08/16/08; see CT [facility]   . Weight gain     target weight 196; BMI <30  . Lung nodule   . Hypertension     Past Surgical History  Procedure Date  . Hip replacement surgery     L   . Kyphosis surgery   . Back surgery     x4  . Knee surgery   . Cardioversion     in past    Family History  Problem Relation Age of Onset  . Cancer Neg Hx   . Diabetes Father     DM     History  Substance Use Topics  . Smoking status: Former Smoker -- 0.5 packs/day for 20 years    Types: Cigarettes    Quit date: 07/28/2002  . Smokeless tobacco: Never Used  . Alcohol Use: No      Review of Systems  All other systems reviewed and are  negative.    Allergies  Review of patient's allergies indicates no known allergies.  Home Medications   Current Outpatient Rx  Name Route Sig Dispense Refill  . BUDESONIDE-FORMOTEROL FUMARATE 160-4.5 MCG/ACT IN AERO Inhalation Inhale 2 puffs into the lungs 2 (two) times daily.     [name] FERROUS SULFATE 325 (65 FE) MG PO TABS Oral Take 325 mg by mouth daily.    [name] HYDROCODONE-ACETAMINOPHEN 5-500 MG PO TABS Oral Take 1 tablet by mouth every 6 (six) hours as needed. PAIN    . METOPROLOL SUCCINATE ER 50 MG PO TB24 Oral Take 75 mg by mouth daily. 1 1/2 tablets daily    . OMEPRAZOLE 20 MG PO CPDR Oral Take 20 mg by mouth daily.    . OXYBUTYNIN CHLORIDE 5 MG PO TABS Oral Take 5 mg by mouth daily.      [name] TAMSULOSIN HCL 0.4 MG PO CAPS Oral Take 0.4 mg by mouth at bedtime.      . WARFARIN SODIUM 2.5 MG PO TABS Oral Take 1.25-2.5 mg by mouth See admin instructions. TAKES 1/2 TABLET 6 DAYS A WEEK EXCEPT TUESDAYS PATIENT TAKES 1 WHOLE  TABLET    . ZOLPIDEM TARTRATE 10 MG PO TABS Oral Take 10 mg by mouth at bedtime.       BP 159/74  Pulse 84  Temp(Src) 97.9 F (36.6 C) (Oral)  Resp 18  SpO2 85%  Physical Exam  Nursing note and vitals reviewed. Constitutional: He is oriented to person, place, and time. He appears well-developed and well-nourished. No distress.       Obese.  Uncomfortable appearing.   HENT:  Head: Normocephalic and atraumatic.  Eyes:       Normal appearance  Neck: Normal range of motion.  Cardiovascular: Normal rate and regular rhythm.   Pulmonary/Chest: Effort normal and breath sounds normal.  Musculoskeletal:       Left hip w/out deformity and no shortening or rotation of LLE.  Surgical scar lateral hip.  Tenderness of entire left hip as well as left groin.  Pain w/ passive flexion of hip greater than 30deg.  2+ DP pulse and distal sensation intact.  Tenderness of lumbar spine.  Pelvis stable.   Neurological: He is alert and oriented to person, place, and time.  Skin: Skin is  warm and dry. No rash noted.  Psychiatric: He has a normal mood and affect. His behavior is normal.    ED Course  Procedures (including critical care time)  Labs Reviewed  CBC - Abnormal; Notable for the following:    RBC 4.01 (*)    Hemoglobin 11.7 (*)    HCT 36.2 (*)    All other components within normal limits  DIFFERENTIAL - Abnormal; Notable for the following:    Neutrophils Relative 82 (*)    Lymphocytes Relative 10 (*)    All other components within normal limits  BASIC METABOLIC PANEL - Abnormal; Notable for the following:    Glucose, Bld 116 (*)    Calcium 7.9 (*)    All other components within normal limits  PROTIME-INR - Abnormal; Notable for the following:    Prothrombin Time 57.9 (*) COUMADIN THERAPY   INR 6.51 (*)    All other components within normal limits  URINALYSIS, ROUTINE W REFLEX MICROSCOPIC - Abnormal; Notable for the following:    Ketones, ur 15 (*)    All other components within normal limits   Dg Hip Complete Left  09/20/2011  *RADIOLOGY REPORT*  Clinical Data: Fall, pain  LEFT HIP - COMPLETE 2+ VIEW  Comparison: 01/10/2009  Findings: Left total hip replacement was performed in 03/12/2009. There is superior dislocation of the femoral component not present previously.  No visible loosening or fracture.  IMPRESSION: Superior dislocation of left T H R femoral component.  Original Report Authenticated By: Elsie Stain, M.D.   Dg Hip Portable 1 View Left  09/20/2011  *RADIOLOGY REPORT*  Clinical Data: Left hip pain  PORTABLE LEFT HIP - 1 VIEW  Comparison: Portable film earlier in the day  Findings: Hip dislocation has been reduced.  The femoral component of the T H R lies within the acetabular fossa.  There is no fracture.  IMPRESSION: Reduced left T H R.  Original Report Authenticated By: Elsie Stain, M.D.     1. Hip dislocation       MDM  [phone] M w/ h/o L hip arthroplasty and bursectomy presents w/ severe L hip pain that started immediately after  hip gave out on him upon standing from a chair.  Unable to bear weight.  Pt appears very uncomfortable, no deformities, diffuse left hip/groin tenderness, pain w/ minimal  passive flexion, no vascular/sensory deficits on exam.  Mild tenderness of lumbar spine as well but patient reports this is chronic and non-tender. Pt receiving IV dilaudid for pain.  Xray L hip is pending.   Xray shows superior dislocation of hip.  Dr. Jeraldine Loots consulted Dr. Ranell Patrick and Dr. Ranell Patrick reduced hip in ED.  Ortho tech placed in knee immobilizer.  Pt has a walker at home.  Prescribed percocet for pain and referred to Dr. Lequita Halt for f/u this week.  Advised pt to hold his coumadin and call his doctor on Monday for INR recheck.          Arie Sabina [place], [place] 09/20/11 2213

## 2011-09-20 NOTE — Consult Note (Signed)
Reason for Consult: Dislocated L total hip  Referring Physician: Dr Mar Daring is an 66 y.o. male.  HPI: Patient two years out from a left total hip for arthritis done by Dr Despina Hick.  Patient reports problems and pain with the hip since surgery, but no dislocations to this point.  He reports getting up from a recliner this evening and having the leg give way under him.  He did not fall, but let himself down to the floor. C/o severe left hip pain.  Past Medical History  Diagnosis Date  . CHF (congestive heart failure)   . COPD (chronic obstructive pulmonary disease)     Wert. PFTs 10/09/08 FEV1 1.39 (44%) ratio 40, DLCO 63%. 16% improvement after bronchodilator. overnight pulse ox 02/28/09 5:50m pent with sat< 89 sleeping > declined further o2 07/05/09. HFA 75% 04/23/09.   [name] Atrial flutter     echo 09/17/08 nl EF, mild LAE, RV mod dilated, pasystolic 46. noct desat 02/28/09, refused 02 @ hs   . Abnormal CT scan     SPN right mid zone. first detected 08/16/08; see [facility]   . Weight gain     target weight 196; BMI <30  . Lung nodule   . Hypertension     Past Surgical History  Procedure Date  . Hip replacement surgery     L   . Kyphosis surgery   . Back surgery     x4  . Knee surgery   . Cardioversion     in past    Family History  Problem Relation Age of Onset  . Cancer Neg Hx   . Diabetes Father     DM     Social History:  reports that he quit smoking about 9 years ago. His smoking use included Cigarettes. He has a 10 pack-year smoking history. He has never used smokeless tobacco. He reports that he does not drink alcohol or use illicit drugs.  Allergies: No Known Allergies  Medications: I have reviewed the patient's current medications.  Results for orders placed during the hospital encounter of 09/20/11 (from the past 48 hour(s))  CBC     Status: Abnormal   Collection Time   09/20/11  6:45 PM      Component Value Range Comment   WBC 8.1  4.0 - 10.5 (K/uL)    RBC  4.01 (*) 4.22 - 5.81 (MIL/uL)    Hemoglobin 11.7 (*) 13.0 - 17.0 (g/dL)    HCT 81.1 (*) 91.4 - 52.0 (%)    MCV 90.3  78.0 - 100.0 (fL)    MCH 29.2  26.0 - 34.0 (pg)    MCHC 32.3  30.0 - 36.0 (g/dL)    RDW 78.2  95.6 - 21.3 (%)    Platelets 208  150 - 400 (K/uL)   DIFFERENTIAL     Status: Abnormal   Collection Time   09/20/11  6:45 PM      Component Value Range Comment   Neutrophils Relative 82 (*) 43 - 77 (%)    Neutro Abs 6.7  1.7 - 7.7 (K/uL)    Lymphocytes Relative 10 (*) 12 - 46 (%)    Lymphs Abs 0.8  0.7 - 4.0 (K/uL)    Monocytes Relative 8  3 - 12 (%)    Monocytes Absolute 0.6  0.1 - 1.0 (K/uL)    Eosinophils Relative 0  0 - 5 (%)    Eosinophils Absolute 0.0  0.0 - 0.7 (K/uL)  Basophils Relative 0  0 - 1 (%)    Basophils Absolute 0.0  0.0 - 0.1 (K/uL)   BASIC METABOLIC PANEL     Status: Abnormal   Collection Time   09/20/11  6:45 PM      Component Value Range Comment   Sodium 139  135 - 145 (mEq/L)    Potassium 3.9  3.5 - 5.1 (mEq/L)    Chloride 102  96 - 112 (mEq/L)    CO2 24  19 - 32 (mEq/L)    Glucose, Bld 116 (*) 70 - 99 (mg/dL)    BUN 10  6 - 23 (mg/dL)    Creatinine, Ser 1.61  0.50 - 1.35 (mg/dL)    Calcium 7.9 (*) 8.4 - 10.5 (mg/dL)    GFR calc non Af Amer >90  >90 (mL/min)    GFR calc Af Amer >90  >90 (mL/min)   PROTIME-INR     Status: Abnormal   Collection Time   09/20/11  6:45 PM      Component Value Range Comment   Prothrombin Time 57.9 (*) 11.6 - 15.2 (seconds) COUMADIN THERAPY   INR 6.51 (*) 0.00 - 1.49    URINALYSIS, ROUTINE W REFLEX MICROSCOPIC     Status: Abnormal   Collection Time   09/20/11  7:02 PM      Component Value Range Comment   Color, Urine YELLOW  YELLOW     APPearance CLEAR  CLEAR     Specific Gravity, Urine 1.020  1.005 - 1.030     pH 5.5  5.0 - 8.0     Glucose, UA NEGATIVE  NEGATIVE (mg/dL)    Hgb urine dipstick NEGATIVE  NEGATIVE     Bilirubin Urine NEGATIVE  NEGATIVE     Ketones, ur 15 (*) NEGATIVE (mg/dL)    Protein, ur  NEGATIVE  NEGATIVE (mg/dL)    Urobilinogen, UA 0.2  0.0 - 1.0 (mg/dL)    Nitrite NEGATIVE  NEGATIVE     Leukocytes, UA NEGATIVE  NEGATIVE  MICROSCOPIC NOT DONE ON URINES WITH NEGATIVE PROTEIN, BLOOD, LEUKOCYTES, NITRITE, OR GLUCOSE <1000 mg/dL.    Dg Hip Complete Left  09/20/2011  *RADIOLOGY REPORT*  Clinical Data: Fall, pain  LEFT HIP - COMPLETE 2+ VIEW  Comparison: 01/10/2009  Findings: Left total hip replacement was performed in 03/12/2009. There is superior dislocation of the femoral component not present previously.  No visible loosening or fracture.  IMPRESSION: Superior dislocation of left T H R femoral component.  Original Report Authenticated By: Elsie Stain, M.D.    ROS Blood pressure 163/74, pulse 79, temperature 97.9 F (36.6 C), temperature source Oral, resp. rate 21, SpO2 94.00%. Physical Exam Shortened, internally rotated left LE.  Unable to actively move the left hip.  Knee is nontender, nonswollen.  NVI L LE  Assessment/Plan: Left hip dislocation s/p THA. No evidence of fracture. Plan closed reduction of the left hip in the ED under conscious sedation. Hip reduced without incident. Post reduction XRAY shows concentric reduction. Knee immobilizer placed. Maintain hip precautions.  Follow up with Dr Despina Hick this week.  Dustin Burrill,STEVEN R 09/20/2011, 8:23 PM

## 2011-09-20 NOTE — Discharge Instructions (Signed)
Stop your coumadin for the rest of the weekend.  Follow up with your family doctor on Monday morning for recheck of level.  It was very high today at 6.5Hip Dislocation Hip dislocation is the displacement of the "ball" at the head of your thigh bone (femur) from its socket in the hip bone (pelvis). The ball-and-socket structure of the hip joint gives it a lot of stability, while allowing it to move freely. Therefore, a lot of force is required to displace the femur from its socket. A hip dislocation is an emergency. If you believe you have dislocated your hip and cannot move your leg, call for help immediately. Do not try to move. CAUSES The most common cause of hip dislocation is motor vehicle accidents. However, force from falls from a height (a ladder or building), injuries from contact sports, or injuries from industrial accidents can be enough to dislocate your hip. SYMPTOMS A hip dislocation is very painful. If you have a dislocated hip, you will not be able to move your hip. If you have nerve damage, you may not have feeling in your lower leg, foot, or ankle.  DIAGNOSIS Usually, your caregiver can diagnose a hip dislocation by looking at the position of your leg. Generally, X-ray exams are done to check for fractures in your femur or pelvis. The leg of the dislocated hip will appear shorter than the other leg, and your foot will be turned inward. TREATMENT  Your caregiver can manipulate your bones back into the joint (reduction). If there are no other complications involved with your dislocation, such as fractures or damage to blood vessels or nerves, this procedure can be done without surgery. Before this procedure, you will be given medicine so that you will not feel pain (anesthetic). Often specialized imaging exams are done after the reduction (magnetic resonance imaging [MRI] or computed tomography [CT]) to check for loose pieces of cartilage or bone in the joint. If a manual reduction fails or  you have nerve damage, damage to your blood vessels, or bone fractures, surgery will be necessary to perform the reduction.  HOME CARE INSTRUCTIONS The following measures can help to reduce pain and speed up the healing process:  Rest your injured joint. Do not move your joint if it is painful. Also, avoid activities similar to the one that caused your injury.   Apply ice to your injured joint for 1 to 2 days after your reduction or as directed by your caregiver. Applying ice helps to reduce inflammation and pain.   Put ice in a plastic bag.   Place a towel between your skin and the bag.   Leave the ice on for 15 to 20 minutes at a time, every 2 hours while you are awake.   Use crutches or a walker as directed by your caregiver.   Exercise your hip and leg as directed by your caregiver.   Take over-the-counter or prescription medicine for pain as directed by your caregiver.  SEEK IMMEDIATE MEDICAL CARE IF:  Your pain becomes worse rather than better.   You feel like your hip has become dislocated again.  MAKE SURE YOU:  Understand these instructions.   Will watch your condition.   Will get help right away if you are not doing well or get worse.  Document Released: 04/08/2001 Document Revised: 03/26/2011 Document Reviewed: 12/12/2010 [facility] Patient Information 2012 [place], [place]..  Take percocet as needed for severe pain.  Take this in place of your vicodin.  Do not  drive within four hours of taking this medication (may cause drowsiness or confusion).   Follow up with Dr. Lequita Halt sometime this week.   You may return to the ER if symptoms worsen or you have any other concerns.

## 2011-09-20 NOTE — ED Provider Notes (Signed)
Medical screening examination/treatment/procedure(s) were conducted as a shared visit with non-physician practitioner(s) and myself.  I personally evaluated the patient during the encounter This obese male with multiple medical problems, including left hip replacement now presents following dislocation of the hip.  During conscious sedation the patient was reduced.  The patient was discharged in stable condition to follow up with orthopedics.  I have reviewed all x-rays Pulse oximetry 100% 2 L nasal cannula abnormal Cardiac monitor 88 sinus rhythm normal   Procedural sedation Performed by: Gerhard Munch Consent: Verbal consent obtained. Risks and benefits: risks, benefits and alternatives were discussed Required items: required blood products, implants, devices, and special equipment available Patient identity confirmed: arm band and provided demographic data Time out: Immediately prior to procedure a "time out" was called to verify the correct patient, procedure, equipment, support staff and site/side marked as required.  Sedation type: moderate (conscious) sedation NPO time confirmed and considedered  Sedatives: PROPOFOL  Physician Time at Bedside: 20  Vitals: Vital signs were monitored during sedation. Cardiac Monitor, pulse oximeter Patient tolerance: Patient tolerated the procedure well with no immediate complications. Comments: Pt with uneventful recovered. Returned to pre-procedural sedation baseline     Gerhard Munch, MD 09/20/11 2329

## 2011-09-20 NOTE — ED Notes (Signed)
[id number]<AV> Expected date:<BR> Expected time: 5:29 PM<BR> Means of arrival:<BR> Comments:<BR> ROCK5 - 65yoM fall from sitting pos, hip pain

## 2011-09-20 NOTE — ED Notes (Signed)
Ortho Tech at bedside for splint and crutch training/instructions.

## 2011-09-20 NOTE — ED Notes (Signed)
Per ems pt is from home. Alert and oriented x4. Non ambulatory at present. ems reports that pt has had at least 2 surgeries on left hip. Today pt was rising from chair and he said "his leg gave out and he fell". Pt is not good historian on fall. Denies back or neck pain. Pt was placed by ems on backboard and c collar, during transport pt requested to be taken off and signed a consent form. Pt has hematomas on both arms, pt is on blood thinners. Pt denies pain upon palpation of hips, no crepitus noted. Reports pain 9/10.

## 2011-09-22 ENCOUNTER — Emergency Department (HOSPITAL_COMMUNITY): Payer: Medicare Other

## 2011-09-22 ENCOUNTER — Encounter (HOSPITAL_COMMUNITY): Payer: Self-pay | Admitting: Emergency Medicine

## 2011-09-22 ENCOUNTER — Other Ambulatory Visit: Payer: Self-pay

## 2011-09-22 ENCOUNTER — Inpatient Hospital Stay (HOSPITAL_COMMUNITY)
Admission: EM | Admit: 2011-09-22 | Discharge: 2011-09-29 | DRG: 501 | Disposition: A | Discharge status: Skilled Nursing Facility | Payer: Medicare Other | Attending: Internal Medicine | Admitting: Internal Medicine

## 2011-09-22 DIAGNOSIS — D6832 Hemorrhagic disorder due to extrinsic circulating anticoagulants: Secondary | ICD-10-CM

## 2011-09-22 DIAGNOSIS — Y999 Unspecified external cause status: Secondary | ICD-10-CM

## 2011-09-22 DIAGNOSIS — D62 Acute posthemorrhagic anemia: Secondary | ICD-10-CM | POA: Diagnosis present

## 2011-09-22 DIAGNOSIS — R911 Solitary pulmonary nodule: Secondary | ICD-10-CM | POA: Diagnosis present

## 2011-09-22 DIAGNOSIS — Z96649 Presence of unspecified artificial hip joint: Secondary | ICD-10-CM

## 2011-09-22 DIAGNOSIS — Y92009 Unspecified place in unspecified non-institutional (private) residence as the place of occurrence of the external cause: Secondary | ICD-10-CM

## 2011-09-22 DIAGNOSIS — M25552 Pain in left hip: Secondary | ICD-10-CM

## 2011-09-22 DIAGNOSIS — I2789 Other specified pulmonary heart diseases: Secondary | ICD-10-CM | POA: Diagnosis present

## 2011-09-22 DIAGNOSIS — I483 Typical atrial flutter: Secondary | ICD-10-CM | POA: Diagnosis present

## 2011-09-22 DIAGNOSIS — T84029A Dislocation of unspecified internal joint prosthesis, initial encounter: Secondary | ICD-10-CM | POA: Diagnosis present

## 2011-09-22 DIAGNOSIS — J449 Chronic obstructive pulmonary disease, unspecified: Secondary | ICD-10-CM | POA: Diagnosis present

## 2011-09-22 DIAGNOSIS — T45515A Adverse effect of anticoagulants, initial encounter: Secondary | ICD-10-CM | POA: Diagnosis present

## 2011-09-22 DIAGNOSIS — T148XXA Other injury of unspecified body region, initial encounter: Secondary | ICD-10-CM | POA: Diagnosis present

## 2011-09-22 DIAGNOSIS — I2782 Chronic pulmonary embolism: Secondary | ICD-10-CM | POA: Diagnosis present

## 2011-09-22 DIAGNOSIS — I739 Peripheral vascular disease, unspecified: Secondary | ICD-10-CM

## 2011-09-22 DIAGNOSIS — Z7901 Long term (current) use of anticoagulants: Secondary | ICD-10-CM

## 2011-09-22 DIAGNOSIS — S7000XA Contusion of unspecified hip, initial encounter: Secondary | ICD-10-CM | POA: Diagnosis present

## 2011-09-22 DIAGNOSIS — I10 Essential (primary) hypertension: Secondary | ICD-10-CM | POA: Diagnosis present

## 2011-09-22 DIAGNOSIS — IMO0002 Reserved for concepts with insufficient information to code with codable children: Secondary | ICD-10-CM

## 2011-09-22 DIAGNOSIS — I2699 Other pulmonary embolism without acute cor pulmonale: Secondary | ICD-10-CM | POA: Diagnosis present

## 2011-09-22 DIAGNOSIS — J984 Other disorders of lung: Secondary | ICD-10-CM

## 2011-09-22 DIAGNOSIS — Z87891 Personal history of nicotine dependence: Secondary | ICD-10-CM

## 2011-09-22 DIAGNOSIS — J4489 Other specified chronic obstructive pulmonary disease: Secondary | ICD-10-CM | POA: Diagnosis present

## 2011-09-22 DIAGNOSIS — W19XXXA Unspecified fall, initial encounter: Secondary | ICD-10-CM | POA: Diagnosis present

## 2011-09-22 DIAGNOSIS — R0902 Hypoxemia: Secondary | ICD-10-CM | POA: Diagnosis not present

## 2011-09-22 DIAGNOSIS — I509 Heart failure, unspecified: Secondary | ICD-10-CM | POA: Diagnosis present

## 2011-09-22 DIAGNOSIS — M7981 Nontraumatic hematoma of soft tissue: Principal | ICD-10-CM | POA: Diagnosis present

## 2011-09-22 DIAGNOSIS — I4892 Unspecified atrial flutter: Secondary | ICD-10-CM | POA: Diagnosis present

## 2011-09-22 DIAGNOSIS — Z79899 Other long term (current) drug therapy: Secondary | ICD-10-CM

## 2011-09-22 DIAGNOSIS — Z6834 Body mass index (BMI) 34.0-34.9, adult: Secondary | ICD-10-CM

## 2011-09-22 LAB — CBC
HCT: 27.6 % — ABNORMAL LOW (ref 39.0–52.0)
Hemoglobin: 8.9 g/dL — ABNORMAL LOW (ref 13.0–17.0)
MCH: 29.1 pg (ref 26.0–34.0)
MCHC: 32.2 g/dL (ref 30.0–36.0)
MCV: 90.2 fL (ref 78.0–100.0)
Platelets: 208 10*3/uL (ref 150–400)
RBC: 3.06 MIL/uL — ABNORMAL LOW (ref 4.22–5.81)
RDW: 14.1 % (ref 11.5–15.5)
WBC: 7.7 K/uL (ref 4.0–10.5)

## 2011-09-22 LAB — BASIC METABOLIC PANEL
Calcium: 8.5 mg/dL (ref 8.4–10.5)
GFR calc Af Amer: >90 mL/min (ref >90)
GFR calc non Af Amer: >90 mL/min (ref >90)
Glucose, Bld: 102 mg/dL — ABNORMAL HIGH (ref 70–99)
Potassium: 3.6 mEq/L (ref 3.5–5.1)
Sodium: 137 mEq/L (ref 135–145)

## 2011-09-22 LAB — BASIC METABOLIC PANEL WITH GFR
BUN: 10 mg/dL (ref 6–23)
CO2: 30 meq/L (ref 19–32)
Chloride: 97 meq/L (ref 96–112)
Creatinine, Ser: 0.55 mg/dL (ref 0.50–1.35)

## 2011-09-22 LAB — PROTIME-INR
INR: 3.58 — ABNORMAL HIGH (ref 0.00–1.49)
Prothrombin Time: 36.3 seconds — ABNORMAL HIGH (ref 11.6–15.2)

## 2011-09-22 MED ORDER — HYDROMORPHONE HCL PF 1 MG/ML IJ SOLN
0.5000 mg | Freq: Once | INTRAMUSCULAR | Status: AC
  Administered 2011-09-22: 0.5 mg via INTRAVENOUS
  Filled 2011-09-22: qty 1

## 2011-09-22 MED ORDER — IOHEXOL 300 MG/ML  SOLN: 100.0000 mL | Freq: Once | INTRAMUSCULAR | Status: AC | PRN

## 2011-09-22 NOTE — ED Notes (Signed)
EKG given to EDP, GHIM.

## 2011-09-22 NOTE — H&P (Signed)
PCP:   Josue Hector, MD, MD   Chief Complaint:  Left hip pain   HPI: This is a 66 year old gentleman who lives alone. He has a history of a left hip replacement in 2010. 2 days ago he was walking in his kitchen when his hip gave out. He came to the ER were imaging reveals a dislocation of the left hip. This was reduced and he was sent home. Additionally his INR was 6.51, he was told to hold his Coumadin. He states that this pain has continued to worsen significantly in the left hip. He was unable to walk so he came back to the ER.  The patient reports no cardiac history. He denies any chest pains. No history of MI. His last stress test in was in 2010 but he states is normal. He has COPD and chronic shortness of breath.  Review of Systems:  positives bolded  denies anorexia, fever, weight loss,, vision loss, decreased hearing, hoarseness, chest pain, syncope, dyspnea on exertion, peripheral edema, balance deficits, hemoptysis, abdominal pain, melena, hematochezia, severe indigestion/heartburn, hematuria, incontinence, genital sores, muscle weakness, suspicious skin lesions, transient blindness, difficulty walking, depression, unusual weight change, abnormal bleeding, enlarged lymph nodes, angioedema, and breast masses.  Past Medical History: Past Medical History  Diagnosis Date  . CHF (congestive heart failure)   . COPD (chronic obstructive pulmonary disease)     Wert. PFTs 10/09/08 FEV1 1.39 (44%) ratio 40, DLCO 63%. 16% improvement after bronchodilator. overnight pulse ox 02/28/09 5:37m pent with sat< 89 sleeping > declined further o2 07/05/09. HFA 75% 04/23/09.   [name] Atrial flutter     echo 09/17/08 nl EF, mild LAE, RV mod dilated, pasystolic 46. noct desat 02/28/09, refused 02 @ hs   . Abnormal CT scan     SPN right mid zone. first detected 08/16/08; see [facility]   . Weight gain     target weight 196; BMI <30  . Lung nodule   . Hypertension    Past Surgical History  Procedure Date  .  Hip replacement surgery     L   . Kyphosis surgery   . Back surgery     x4  . Knee surgery   . Cardioversion     in past    Medications: Prior to Admission medications   Medication Sig Start Date End Date Taking? Authorizing Provider  budesonide-formoterol (SYMBICORT) 160-4.5 MCG/ACT inhaler Inhale 2 puffs into the lungs 2 (two) times daily.    Yes Historical Provider, MD  ferrous sulfate 325 (65 FE) MG tablet Take 325 mg by mouth daily.   Yes Historical Provider, MD  HYDROcodone-acetaminophen (VICODIN) 5-500 MG per tablet Take 1 tablet by mouth every 6 (six) hours as needed. PAIN   Yes Historical Provider, MD  metoprolol (TOPROL-XL) 50 MG 24 hr tablet Take 75 mg by mouth daily. 1 1/2 tablets daily   Yes Historical Provider, MD  omeprazole (PRILOSEC) 20 MG capsule Take 20 mg by mouth daily.   Yes Historical Provider, MD  oxybutynin (DITROPAN) 5 MG tablet Take 5 mg by mouth daily.     Yes Historical Provider, MD  oxyCODONE-acetaminophen (PERCOCET) 5-325 MG per tablet Take 2 tablets by mouth every 4 (four) hours as needed for pain. 09/20/11 09/30/11 Yes Catherine E Schinlever, PA  Tamsulosin HCl (FLOMAX) 0.4 MG CAPS Take 0.4 mg by mouth at bedtime.     Yes Historical Provider, MD  warfarin (COUMADIN) 2.5 MG tablet Take 1.25-2.5 mg by mouth See admin instructions. TAKES  1/2 TABLET 6 DAYS A WEEK EXCEPT TUESDAYS PATIENT TAKES 1 WHOLE TABLET   Yes Historical Provider, MD  zolpidem (AMBIEN) 10 MG tablet Take 10 mg by mouth at bedtime.    Yes Historical Provider, MD    Allergies:  No Known Allergies  Social History:  reports that he quit smoking about 9 years ago. His smoking use included Cigarettes. He has a 10 pack-year smoking history. He has never used smokeless tobacco. He reports that he does not drink alcohol or use illicit drugs.  Family History: Family History  Problem Relation Age of Onset  . Cancer Neg Hx   . Diabetes Father     DM     Physical Exam: Filed Vitals:   09/22/11  1530 09/22/11 1921  BP: 100/59 127/63  Pulse: 75 74  Temp: 98.5 F (36.9 C) 98.5 F (36.9 C)  TempSrc: Oral Oral  Resp: 18 18  Height: 5' 7.5" (1.715 m)   Weight: 101.606 kg (224 lb)   SpO2: 95% 96%    General:  Alert and oriented times three, well developed and nourished, no acute distress Eyes: PERRLA, pink conjunctiva, no scleral icterus ENT: Moist oral mucosa, neck supple, no thyromegaly Lungs: clear to ascultation, no wheeze, no crackles, no use of accessory muscles Cardiovascular: regular rate and rhythm, no regurgitation, no gallops, no murmurs. No carotid bruits, no JVD Abdomen: soft, positive BS, non-tender, non-distended, no organomegaly, not an acute abdomen GU: not examined Neuro: CN II - XII grossly intact, sensation intact Musculoskeletal: strength 5/5 all extremities, strength left lower extremity not assessed  no clubbing, cyanosis or edema, bruising left arm, left hip and thigh swollen and firm to palpation positive tenderness to palpation, minimal bruising Skin: no rash, no subcutaneous crepitation, no decubitus Psych: appropriate patient   Labs on Admission:   [facility] 09/22/11 1655 09/20/11 1845  NA 137 139  K 3.6 3.9  CL 97 102  CO2 30 24  GLUCOSE 102* 116*  BUN 10 10  CREATININE 0.55 0.57  CALCIUM 8.5 7.9*  MG -- --  PHOS -- --  Results for TOMIO, KIRK (MRN [id number]) as of 09/22/2011 23:38  Ref. Range 09/22/2011 16:19  Prothrombin Time Latest Range: 11.6-15.2 seconds 36.3 (H)  INR Latest Range: 0.00-1.49  3.58 (H)   No results found for this basename: AST:2,ALT:2,ALKPHOS:2,BILITOT:2,PROT:2,ALBUMIN:2 in the last 72 hours No results found for this basename: LIPASE:2,AMYLASE:2 in the last 72 hours  Basename 09/22/11 1655 09/20/11 1845  WBC 7.7 8.1  NEUTROABS -- 6.7  HGB 8.9* 11.7*  HCT 27.6* 36.2*  MCV 90.2 90.3  PLT 208 208   No results found for this basename: CKTOTAL:3,CKMB:3,CKMBINDEX:3,TROPONINI:3 in the last 72 hours No components found  with this basename: POCBNP:3 No results found for this basename: DDIMER:2 in the last 72 hours No results found for this basename: HGBA1C:2 in the last 72 hours No results found for this basename: CHOL:2,HDL:2,LDLCALC:2,TRIG:2,CHOLHDL:2,LDLDIRECT:2 in the last 72 hours No results found for this basename: TSH,T4TOTAL,FREET3,T3FREE,THYROIDAB in the last 72 hours No results found for this basename: VITAMINB12:2,FOLATE:2,FERRITIN:2,TIBC:2,IRON:2,RETICCTPCT:2 in the last 72 hours  Micro Results: No results found for this or any previous visit (from the past 240 hour(s)).   Radiological Exams on Admission: Ct Abdomen Pelvis Wo Contrast  09/22/2011  *RADIOLOGY REPORT*  Clinical Data: Recent left hip dislocation and reduction.  Left lower abdominal pain.  CT ABDOMEN AND PELVIS WITHOUT CONTRAST  Technique:  Multidetector CT imaging of the abdomen and pelvis was performed following the standard protocol  without intravenous contrast.  Comparison: Chest CT 02/07/2011  Findings: There is a ground-glass opacity in the right lower lung which is incompletely imaged.   A stable subpleural nodular density along the right hemidiaphragm.  No evidence for free air.  There is a subtle 8 mm low density in the anterior liver on image 23 which appears unchanged. There is also a stable round 1.5 cm low density structure in the caudate which most likely represents a cyst.  No gross abnormality to the gallbladder, pancreas or spleen. Stable nodularity of the left adrenal gland with Hounsfield units around 10.  This most likely represents an adenoma.  There are two 3 mm calcifications in the right kidney.  Small exophytic low- density structure along the posterior right kidney roughly measures 7 mm.  No evidence of left kidney stones.  No evidence of hydronephrosis.  Diffuse diverticulosis in the left colon.  There is high-density material surrounding the posterior aspect of the left hip and proximal left femur.  Findings are most  compatible with hematoma formation.  There appears to be fluid-blood levels in the superior aspect of this collection, best seen on image 73. There is subcutaneous swelling in the soft tissues lateral to the left hip.  There is high density stranding in the left retroperitoneum that extends up to the aortic bifurcation. Findings are consistent with a left retroperitoneal hematoma. There is an unusual configuration of the left external iliac artery on image 64 which may be related to an adjacent small lymph node. Aneurysm in this area cannot be excluded on this noncontrast examination.  The iliac vessels are heavily calcified.  The left hip prosthesis is located and the right hip is located. Compression fractures at T12, L1, L2 and L3 with bone cement. 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  There may be mild compression deformity at L5 as well.  IMPRESSION: Hematoma surrounding the posterior left hip with a small amount of retroperitoneal blood in the left hemi pelvis.  Unusual configuration of the left external iliac artery probably related to an adjacent lymph node but cannot exclude a small aneurysm in this area.  This area could be further evaluated with a postcontrast CT examination.  Ground-glass opacity in the right lower lobe is incompletely imaged.  This is new from the prior examination. This could represent an infectious or inflammatory process of unknown age. The patient would benefit from a follow-up chest CT for further evaluation.  Please note that ground-glass opacities could also be associated with lung neoplasms.  Calcifications in the right right kidney may represent nonobstructing stones.  Too small to definitely characterize low density structures in the liver and right kidney.  Original Report Authenticated By: Richarda Overlie, M.D.   Dg Lumbar Spine Complete  09/22/2011  *RADIOLOGY REPORT*  Clinical Data: Low back and left PET hip pain, recent dislocation of  left hip replacement  LUMBAR SPINE - COMPLETE 4+ VIEW  Comparison: Lumbar spine films of 10/27/2009  Findings: Vertebroplasties have been performed at T12, L1, L2, and L3 levels.  Compared to the post vertebroplasty images from 05/28/2011, the degree of compressions of these lumbar vertebral bodies appears stable.  No new definite compression deformity is seen.  Pars defects at L5 again are noted.  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  No new compression deformity. Diffuse osteopenia.  Original Report Authenticated By: Juline Patch, M.D.   Dg Hip Complete Left  09/22/2011  *RADIOLOGY REPORT*  Clinical Data: Left  hip pain, recent dislocation  LEFT HIP - COMPLETE 2+ VIEW  Comparison: Portable left hip films of 09/20/2011  Findings: The femoral and acetabular components of the left hip replacement are in good position.  No acute fracture is seen.  No dislocation is noted.  The bones are very osteopenic.  IMPRESSION: Left total hip replacement in good position .  No acute abnormality.  Osteopenia.  Original Report Authenticated By: Juline Patch, M.D.   Ct Pelvis W Contrast  09/22/2011  *RADIOLOGY REPORT*  Clinical Data:  Hematoma surrounding the postoperative left hip joint, with associated retroperitoneal blood.  Question of abnormal configuration of the left external iliac artery on prior CT. Contrast study recommended for further evaluation.  CT PELVIS WITH CONTRAST  Technique:  Multidetector CT imaging of the pelvis was performed using the standard protocol following the bolus administration of intravenous contrast.  Contrast:   100 mL of Omnipaque 300 IV contrast  Comparison:  CT of the abdomen and pelvis performed earlier today at 07:53 p.m.  Findings:  A large hematoma is again noted within the proximal left thigh musculature surrounding the patient's left hip prosthesis; this appears slightly increased in size from the recent CT of the abdomen and pelvis, measuring approximately 10.9 x 10.0  cm.  The inferior extent of the hematoma is not seen on this study.  The patient's left hip total arthroplasty appears grossly intact, though incompletely imaged on this study.  Suggested cortical irregularity along the left superior pubic ramus appears to be artifactual in nature, on correlation with coronal images. There is no definite evidence of new fracture.  The degree of retroperitoneal stranding and hemorrhage is relatively stable from the prior study.  This remains relatively mild.  However, note is made of an inflamed diverticulum along the proximal sigmoid colon, with surrounding soft tissue stranding. Some of the findings at the left lower quadrant could reflect mild acute diverticulitis, though the pelvic sidewall stranding likely reflects mild hemorrhage.  The small nodular density along the left external iliac artery reflects a small lymph node; the configuration of the left external iliac artery remains within normal limits.  There is prominent intramural thrombus and diffuse calcification along the distal abdominal aorta and its branches; likely severe stenosis is noted along the right common and external iliac arteries.  Diffuse diverticulosis involves the visualized distal descending and sigmoid colon. Visualized small bowel loops are grossly unremarkable.  The bladder is mildly distended; mild surrounding soft tissue stranding is nonspecific in appearance.  There is mild grade 1 anterolisthesis of L5 on S1, with chronic bilateral pars defects.  IMPRESSION:  1.  Slight interval increase in size of large partially intramuscular hematoma surrounding the left hip prosthesis and extending inferiorly along the proximal left thigh, measuring 10.9 x 10.0 cm; the inferior extent of the hematoma is not characterized on this study. 2.  Relatively mild retroperitoneal stranding and hemorrhage is relatively stable in appearance.  Note made of an inflamed diverticula along the proximal sigmoid colon, with  surrounding soft tissue stranding.  This could conceivably reflect mild acute diverticulitis, depending on the patient's symptoms. 3.  Small nodular density along the left external iliac artery reflects a small lymph node; the configuration of the left external iliac artery remains within normal limits. 4.  Prominent intramural thrombus and diffuse calcification along the distal abdominal aorta and its branches; likely severe stenosis noted along the right common and external iliac arteries. 5.  Diffuse diverticulosis involves the distal descending and  sigmoid colon. 6.  Mild grade 1 anterolisthesis of L5 on S1, with chronic bilateral pars defects.  These results were called by telephone on 09/21/2010  at  10:40 p.m. to  Dr. Quita Skye, who verbally acknowledged these results.  Original Report Authenticated By: Tonia Ghent, M.D.    Assessment/Plan Present on Admission:  .Hematoma left hip  Hypercoagulation  Admit to MedSurg Hold Coumadin and FFP ordered Serial H&H is ordered. We'll posttransfusion INR.  Orthopedic surgeon Dr. Charlann Boxer is aware and will see patient in the a.m. likely hematoma evacuation   pain control Anemia due to acute hemorrhage Serial H&H ordered As stated anticoagulation will be reversed Type and screen  .Atrial flutter .COPD UNSPECIFIED .Morbid obesity .Pulmonary embolism .PULMONARY HYPERTENSION, SECONDARY  stable resume home medications  SCDs for DVT prophylaxis Full code Team 1/Dr. Loura Pardon, Erianna Jolly 09/22/2011, 11:36 PM

## 2011-09-22 NOTE — ED Provider Notes (Signed)
History     CSN: [id number]  Arrival date & time 09/22/11  1520   First MD Initiated Contact with Patient 09/22/11 1547      Chief Complaint  Patient presents with  . Hip Pain    Pt stated that he was seen on 09/20/2011 for lt hip dislocation reduction was done and pt sent home to f/u with Orthro.Here today for increased Lt hip pain unable to walk    (Consider location/radiation/quality/duration/timing/severity/associated sxs/prior treatment) HPI Comments: Pt was seen 2 days ago apparently for a spontaneous left hip dislocation, he is s/p total hip arthoplasty, that was relocated after procedural sedation.  He reported at the time that he had gotten up from bed to go to the bathroom and had taken 1 step and his left hip gave out and he fell.  He skinned his elbow, which is improved now, and has no pain in elbow here.  He was discharged to home where he has been with his brother since he normally lives alone.  Apparently, brother called sister and stated that he couldn't care for brother any longer, pt complains of paroxysmal severe pain to left hip and cannot ambulate nor bear any weight.  No new falls.  Denies any new back pains.  He follows Dr. Lequita Halt normally.  Sister called ortho office and told by staff to come to the ED.  PCP is Dr. Denzil Magnuson.  He denies any current CP, back pain, SOB, cough, fever.  He has sig h/o COPD, CHF.  He reports appetite is down, feels SOB when his hip pain gets severe, but no SOB when his pain is somewhat stabilized when he stops moving.    Patient is a 66 y.o. male presenting with hip pain. The history is provided by the patient and a relative.  Hip Pain    Past Medical History  Diagnosis Date  . CHF (congestive heart failure)   . COPD (chronic obstructive pulmonary disease)     Wert. PFTs 10/09/08 FEV1 1.39 (44%) ratio 40, DLCO 63%. 16% improvement after bronchodilator. overnight pulse ox 02/28/09 5:92m pent with sat< 89 sleeping > declined further o2 07/05/09.  HFA 75% 04/23/09.   [name] Atrial flutter     echo 09/17/08 nl EF, mild LAE, RV mod dilated, pasystolic 46. noct desat 02/28/09, refused 02 @ hs   . Abnormal CT scan     SPN right mid zone. first detected 08/16/08; see [facility]   . Weight gain     target weight 196; BMI <30  . Lung nodule   . Hypertension     Past Surgical History  Procedure Date  . Hip replacement surgery     L   . Kyphosis surgery   . Back surgery     x4  . Knee surgery   . Cardioversion     in past    Family History  Problem Relation Age of Onset  . Cancer Neg Hx   . Diabetes Father     DM     History  Substance Use Topics  . Smoking status: Former Smoker -- 0.5 packs/day for 20 years    Types: Cigarettes    Quit date: 07/28/2002  . Smokeless tobacco: Never Used  . Alcohol Use: No      Review of Systems  All other systems reviewed and are negative.    Allergies  Review of patient's allergies indicates no known allergies.  Home Medications   Current Outpatient Rx  Name Route Sig  Dispense Refill  . BUDESONIDE-FORMOTEROL FUMARATE 160-4.5 MCG/ACT IN AERO Inhalation Inhale 2 puffs into the lungs 2 (two) times daily.     [name] FERROUS SULFATE 325 (65 FE) MG PO TABS Oral Take 325 mg by mouth daily.    [name] HYDROCODONE-ACETAMINOPHEN 5-500 MG PO TABS Oral Take 1 tablet by mouth every 6 (six) hours as needed. PAIN    . METOPROLOL SUCCINATE ER 50 MG PO TB24 Oral Take 75 mg by mouth daily. 1 1/2 tablets daily    . OMEPRAZOLE 20 MG PO CPDR Oral Take 20 mg by mouth daily.    . OXYBUTYNIN CHLORIDE 5 MG PO TABS Oral Take 5 mg by mouth daily.      . OXYCODONE-ACETAMINOPHEN 5-325 MG PO TABS Oral Take 2 tablets by mouth every 4 (four) hours as needed for pain. 15 tablet 0  . TAMSULOSIN HCL 0.4 MG PO CAPS Oral Take 0.4 mg by mouth at bedtime.      . WARFARIN SODIUM 2.5 MG PO TABS Oral Take 1.25-2.5 mg by mouth See admin instructions. TAKES 1/2 TABLET 6 DAYS A WEEK EXCEPT TUESDAYS PATIENT TAKES 1 WHOLE TABLET    . ZOLPIDEM  TARTRATE 10 MG PO TABS Oral Take 10 mg by mouth at bedtime.       BP 127/63  Pulse 74  Temp(Src) 98.5 F (36.9 C) (Oral)  Resp 18  Ht 5' 7.5" (1.715 m)  Wt 224 lb (101.606 kg)  BMI 34.57 kg/m2  SpO2 96%  Physical Exam  Nursing note and vitals reviewed. Constitutional: He appears well-developed and well-nourished.  HENT:  Head: Normocephalic and atraumatic.  Eyes: Pupils are equal, round, and reactive to light. No scleral icterus.  Neck: Normal range of motion. Neck supple.  Cardiovascular: Normal rate and regular rhythm.   Pulmonary/Chest: Effort normal. No respiratory distress. He has no wheezes.  Abdominal: Soft. There is no tenderness.  Musculoskeletal: He exhibits tenderness. He exhibits no edema.       Lumbar back: He exhibits no tenderness, no bony tenderness, no deformity, no pain and no spasm.       Back:       Legs: Neurological: He is alert.  Skin: Skin is warm, dry and intact. No abrasion and no rash noted. No pallor.  Psychiatric: He has a normal mood and affect.    ED Course  Procedures (including critical care time)  Labs Reviewed  CBC - Abnormal; Notable for the following:    RBC 3.06 (*)    Hemoglobin 8.9 (*) DELTA CHECK NOTED   HCT 27.6 (*)    All other components within normal limits  BASIC METABOLIC PANEL - Abnormal; Notable for the following:    Glucose, Bld 102 (*)    All other components within normal limits  PROTIME-INR - Abnormal; Notable for the following:    Prothrombin Time 36.3 (*)    INR 3.58 (*)    All other components within normal limits   Ct Abdomen Pelvis Wo Contrast  09/22/2011  *RADIOLOGY REPORT*  Clinical Data: Recent left hip dislocation and reduction.  Left lower abdominal pain.  CT ABDOMEN AND PELVIS WITHOUT CONTRAST  Technique:  Multidetector CT imaging of the abdomen and pelvis was performed following the standard protocol without intravenous contrast.  Comparison: Chest CT 02/07/2011  Findings: There is a ground-glass  opacity in the right lower lung which is incompletely imaged.   A stable subpleural nodular density along the right hemidiaphragm.  No evidence for free air.  There  is a subtle 8 mm low density in the anterior liver on image 23 which appears unchanged. There is also a stable round 1.5 cm low density structure in the caudate which most likely represents a cyst.  No gross abnormality to the gallbladder, pancreas or spleen. Stable nodularity of the left adrenal gland with Hounsfield units around 10.  This most likely represents an adenoma.  There are two 3 mm calcifications in the right kidney.  Small exophytic low- density structure along the posterior right kidney roughly measures 7 mm.  No evidence of left kidney stones.  No evidence of hydronephrosis.  Diffuse diverticulosis in the left colon.  There is high-density material surrounding the posterior aspect of the left hip and proximal left femur.  Findings are most compatible with hematoma formation.  There appears to be fluid-blood levels in the superior aspect of this collection, best seen on image 73. There is subcutaneous swelling in the soft tissues lateral to the left hip.  There is high density stranding in the left retroperitoneum that extends up to the aortic bifurcation. Findings are consistent with a left retroperitoneal hematoma. There is an unusual configuration of the left external iliac artery on image 64 which may be related to an adjacent small lymph node. Aneurysm in this area cannot be excluded on this noncontrast examination.  The iliac vessels are heavily calcified.  The left hip prosthesis is located and the right hip is located. Compression fractures at T12, L1, L2 and L3 with bone cement. 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  There may be mild compression deformity at L5 as well.  IMPRESSION: Hematoma surrounding the posterior left hip with a small amount of retroperitoneal blood in the left hemi  pelvis.  Unusual configuration of the left external iliac artery probably related to an adjacent lymph node but cannot exclude a small aneurysm in this area.  This area could be further evaluated with a postcontrast CT examination.  Ground-glass opacity in the right lower lobe is incompletely imaged.  This is new from the prior examination. This could represent an infectious or inflammatory process of unknown age. The patient would benefit from a follow-up chest CT for further evaluation.  Please note that ground-glass opacities could also be associated with lung neoplasms.  Calcifications in the right right kidney may represent nonobstructing stones.  Too small to definitely characterize low density structures in the liver and right kidney.  Original Report Authenticated By: Richarda Overlie, M.D.   Dg Lumbar Spine Complete  09/22/2011  *RADIOLOGY REPORT*  Clinical Data: Low back and left PET hip pain, recent dislocation of left hip replacement  LUMBAR SPINE - COMPLETE 4+ VIEW  Comparison: Lumbar spine films of 10/27/2009  Findings: Vertebroplasties have been performed at T12, L1, L2, and L3 levels.  Compared to the post vertebroplasty images from 05/28/2011, the degree of compressions of these lumbar vertebral bodies appears stable.  No new definite compression deformity is seen.  Pars defects at L5 again are noted.  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  No new compression deformity. Diffuse osteopenia.  Original Report Authenticated By: Juline Patch, M.D.   Dg Hip Complete Left  09/22/2011  *RADIOLOGY REPORT*  Clinical Data: Left hip pain, recent dislocation  LEFT HIP - COMPLETE 2+ VIEW  Comparison: Portable left hip films of 09/20/2011  Findings: The femoral and acetabular components of the left hip replacement are in good position.  No acute fracture is seen.  No dislocation  is noted.  The bones are very osteopenic.  IMPRESSION: Left total hip replacement in good position .  No acute abnormality.   Osteopenia.  Original Report Authenticated By: Juline Patch, M.D.   I reviewed both prior xrays above. Kaitlynn Tramontana Y.   1. Pelvic hematoma   2. Hip pain, left   3. Warfarin-induced coagulopathy      RA sat is 95% and normal.  ECG at time 21:43 shows normal sinus rhythm at rate of 80 with right bundle branch block. Normal axis. No significant change compared to EKG from 02/12/2011. MDM  I reviewed ED notes form 2/23.  Reports that pt was standing when hip gave way, he actually did not fall hard and that he lowered himself to the ground since he couldn't bear weight.  Pt's story seems to have changed here today.  Pt now reports he fell, he doesn't have complete memory of fall.  Sister reports when she got to his home, there was blood on the ground, presumably from his elbow and that he was very pale and didn't appear well.        5:52 PM Patient reports that the IV Dilaudid did significantly improve his pain, although he is still unable to turn himself or lie on his hip. He plain films which the radiologist read as no acute fracture or dislocations. Interestingly, the patient's hemoglobin here today is 8.9 which is down from 12 2 days ago. Patient reports that he was told by his regular Dr. about one or 2 weeks ago that he should begin iron. He reports that he did see some rectal bleeding a few weeks ago. He was also told to days ago that his INR was greater than a 6 and that from the emergency department, he is instructed to hold on his Coumadin for the next 4 days. He denies any recent black or bloody stools. He reports that he has been taking daily iron for approximately one week now. Given the patient is not able to roll onto his hip, but was not able to visualize his rectal area. I was able to obtain a stool specimen showing brown stool that was faintly heme positive.  8:12 PM I reviewed pt's CT myself, appear sto have mixed age hematoma surrounding thigh and hip region and pelvis  which I think easily explains why Hgb is down and why his hip pain is so severe.     9:00 PM Spoke to Dr. Charlann Boxer who reviewed CT, think he may need hematoma evacuation, but needs medical clearance and reversal of coagulopathy.  With atrial fib/flutter, pt would likely benefit from medical admission.  Will discuss with [facility] hospitalist.  He reports he or Dr. Lequita Halt will see pt in the hospital, probably tomorrow.   10:28 PM [facility] has called and will see pt in the ED and admit.    Gavin Pound. Oletta Lamas, MD 09/22/11 2229

## 2011-09-23 ENCOUNTER — Encounter: Payer: Self-pay | Admitting: Internal Medicine

## 2011-09-23 LAB — HEMOGLOBIN AND HEMATOCRIT, BLOOD: HCT: 24.4 % — ABNORMAL LOW (ref 39.0–52.0)

## 2011-09-23 LAB — BASIC METABOLIC PANEL
CO2: 26 mEq/L (ref 19–32)
GFR calc non Af Amer: >90 mL/min (ref >90)
Glucose, Bld: 88 mg/dL (ref 70–99)
Potassium: 3.4 mEq/L — ABNORMAL LOW (ref 3.5–5.1)
Sodium: 136 mEq/L (ref 135–145)

## 2011-09-23 LAB — CBC
Hemoglobin: 7.9 g/dL — ABNORMAL LOW (ref 13.0–17.0)
MCH: 28.7 pg (ref 26.0–34.0)
RBC: 2.75 MIL/uL — ABNORMAL LOW (ref 4.22–5.81)
WBC: 7.5 10*3/uL (ref 4.0–10.5)

## 2011-09-23 LAB — PROTIME-INR
INR: 2.46 — ABNORMAL HIGH (ref 0.00–1.49)
Prothrombin Time: 27.1 seconds — ABNORMAL HIGH (ref 11.6–15.2)

## 2011-09-23 LAB — OCCULT BLOOD, POC DEVICE: Fecal Occult Bld: POSITIVE

## 2011-09-23 MED ORDER — FERROUS SULFATE 325 (65 FE) MG PO TABS
325.0000 mg | ORAL_TABLET | Freq: Every day | ORAL | Status: DC
  Administered 2011-09-23 – 2011-09-24 (×2): 325 mg via ORAL
  Filled 2011-09-23 (×4): qty 1

## 2011-09-23 MED ORDER — BUDESONIDE-FORMOTEROL FUMARATE 160-4.5 MCG/ACT IN AERO
2.0000 | INHALATION_SPRAY | Freq: Two times a day (BID) | RESPIRATORY_TRACT | Status: DC
  Administered 2011-09-23 – 2011-09-29 (×12): 2 via RESPIRATORY_TRACT
  Filled 2011-09-23: qty 6

## 2011-09-23 MED ORDER — METOPROLOL SUCCINATE ER 50 MG PO TB24
75.0000 mg | ORAL_TABLET | Freq: Every day | ORAL | Status: DC
  Administered 2011-09-23 – 2011-09-29 (×7): 75 mg via ORAL
  Filled 2011-09-23 (×7): qty 1

## 2011-09-23 MED ORDER — HYDROCODONE-ACETAMINOPHEN 5-325 MG PO TABS
1.0000 | ORAL_TABLET | ORAL | Status: DC | PRN
  Administered 2011-09-26 – 2011-09-29 (×7): 2 via ORAL
  Filled 2011-09-23 (×8): qty 2

## 2011-09-23 MED ORDER — HYDROMORPHONE HCL PF 1 MG/ML IJ SOLN
0.5000 mg | INTRAMUSCULAR | Status: DC | PRN
  Administered 2011-09-23 – 2011-09-25 (×11): 1 mg via INTRAVENOUS
  Administered 2011-09-25: 0.5 mg via INTRAVENOUS
  Administered 2011-09-26 (×4): 1 mg via INTRAVENOUS
  Filled 2011-09-23 (×17): qty 1

## 2011-09-23 MED ORDER — ONDANSETRON HCL 4 MG/2ML IJ SOLN: 4.0000 mg | Freq: Four times a day (QID) | INTRAMUSCULAR | Status: DC | PRN

## 2011-09-23 MED ORDER — ALBUTEROL SULFATE (5 MG/ML) 0.5% IN NEBU: 2.5000 mg | INHALATION_SOLUTION | RESPIRATORY_TRACT | Status: DC | PRN

## 2011-09-23 MED ORDER — ZOLPIDEM TARTRATE 10 MG PO TABS
10.0000 mg | ORAL_TABLET | Freq: Every day | ORAL | Status: DC
  Administered 2011-09-23 – 2011-09-28 (×6): 10 mg via ORAL
  Filled 2011-09-23 (×6): qty 1

## 2011-09-23 MED ORDER — IPRATROPIUM BROMIDE 0.02 % IN SOLN: 0.5000 mg | RESPIRATORY_TRACT | Status: DC | PRN

## 2011-09-23 MED ORDER — TAMSULOSIN HCL 0.4 MG PO CAPS
0.4000 mg | ORAL_CAPSULE | Freq: Every day | ORAL | Status: DC
  Administered 2011-09-23 – 2011-09-28 (×6): 0.4 mg via ORAL
  Filled 2011-09-23 (×7): qty 1

## 2011-09-23 MED ORDER — OXYBUTYNIN CHLORIDE 5 MG PO TABS
5.0000 mg | ORAL_TABLET | Freq: Every day | ORAL | Status: DC
  Administered 2011-09-23 – 2011-09-29 (×7): 5 mg via ORAL
  Filled 2011-09-23 (×7): qty 1

## 2011-09-23 MED ORDER — POTASSIUM CHLORIDE IN NACL 20-0.9 MEQ/L-% IV SOLN
INTRAVENOUS | Status: DC
  Administered 2011-09-23 – 2011-09-25 (×5): via INTRAVENOUS
  Administered 2011-09-26: 75 mL/h via INTRAVENOUS
  Administered 2011-09-27: 01:00:00 via INTRAVENOUS
  Filled 2011-09-23 (×11): qty 1000

## 2011-09-23 MED ORDER — PANTOPRAZOLE SODIUM 40 MG PO TBEC
40.0000 mg | DELAYED_RELEASE_TABLET | Freq: Every day | ORAL | Status: DC
  Administered 2011-09-23 – 2011-09-24 (×2): 40 mg via ORAL
  Filled 2011-09-23 (×2): qty 1

## 2011-09-23 MED ORDER — HYDROMORPHONE HCL PF 1 MG/ML IJ SOLN
1.0000 mg | INTRAMUSCULAR | Status: DC | PRN
  Administered 2011-09-23 – 2011-09-29 (×12): 1 mg via INTRAVENOUS
  Filled 2011-09-23 (×13): qty 1

## 2011-09-23 MED ORDER — OXYCODONE-ACETAMINOPHEN 5-325 MG PO TABS
2.0000 | ORAL_TABLET | ORAL | Status: DC | PRN
  Administered 2011-09-23: 2 via ORAL
  Filled 2011-09-23: qty 2

## 2011-09-23 MED ORDER — ONDANSETRON HCL 4 MG PO TABS: 4.0000 mg | ORAL_TABLET | Freq: Four times a day (QID) | ORAL | Status: DC | PRN

## 2011-09-23 NOTE — Progress Notes (Signed)
Francisco Tanner is a 66 y.o. male patient well known to Dr. Lequita Halt. 1. Pelvic hematoma   2. Hip pain, left   3. Warfarin-induced coagulopathy    Past Medical History  Diagnosis Date  . CHF (congestive heart failure)   . COPD (chronic obstructive pulmonary disease)     Wert. PFTs 10/09/08 FEV1 1.39 (44%) ratio 40, DLCO 63%. 16% improvement after bronchodilator. overnight pulse ox 02/28/09 5:26m pent with sat< 89 sleeping > declined further o2 07/05/09. HFA 75% 04/23/09.   [name] Atrial flutter     echo 09/17/08 nl EF, mild LAE, RV mod dilated, pasystolic 46. noct desat 02/28/09, refused 02 @ hs   . Abnormal CT scan     SPN right mid zone. first detected 08/16/08; see [facility]   . Weight gain     target weight 196; BMI <30  . Lung nodule   . Hypertension    Current Facility-Administered Medications  Medication Dose Route Frequency Provider Last Rate Last Dose  . 0.9 % NaCl with KCl 20 mEq/ L  infusion   Intravenous Continuous Debby Crosley, MD 75 mL/hr at 09/23/11 0106    . ipratropium (ATROVENT) nebulizer solution 0.5 mg  0.5 mg Nebulization Q4H PRN Debby Crosley, MD       And  . albuterol (PROVENTIL) (5 MG/ML) 0.5% nebulizer solution 2.5 mg  2.5 mg Nebulization Q4H PRN Debby Crosley, MD      . budesonide-formoterol (SYMBICORT) 160-4.5 MCG/ACT inhaler 2 puff  2 puff Inhalation BID Gery Pray, MD   2 puff at 09/23/11 0806  . ferrous sulfate tablet 325 mg  325 mg Oral Daily Debby Crosley, MD   325 mg at 09/23/11 0858  . HYDROmorphone (DILAUDID) injection 0.5 mg  0.5 mg Intravenous Once Gavin Pound. Ghim, MD   0.5 mg at 09/22/11 1652  . HYDROmorphone (DILAUDID) injection 0.5 mg  0.5 mg Intravenous Once Gavin Pound. Ghim, MD   0.5 mg at 09/22/11 2007  . HYDROmorphone (DILAUDID) injection 1 mg  1 mg Intravenous Q4H PRN Gery Pray, MD   1 mg at 09/23/11 0856  . iohexol (OMNIPAQUE) 300 MG/ML solution 100 mL  100 mL Intravenous Once PRN Medication Radiologist, MD      . metoprolol succinate (TOPROL-XL) 24 hr  tablet 75 mg  75 mg Oral Daily Debby Crosley, MD   75 mg at 09/23/11 0857  . ondansetron (ZOFRAN) tablet 4 mg  4 mg Oral Q6H PRN Debby Crosley, MD       Or  . ondansetron (ZOFRAN) injection 4 mg  4 mg Intravenous Q6H PRN Debby Crosley, MD      . oxybutynin (DITROPAN) tablet 5 mg  5 mg Oral Daily Debby Crosley, MD   5 mg at 09/23/11 0857  . oxyCODONE-acetaminophen (PERCOCET) 5-325 MG per tablet 2 tablet  2 tablet Oral Q4H PRN Gery Pray, MD   2 tablet at 09/23/11 0415  . pantoprazole (PROTONIX) EC tablet 40 mg  40 mg Oral Q1200 Debby Crosley, MD   40 mg at 09/23/11 0858  . Tamsulosin HCl (FLOMAX) capsule 0.4 mg  0.4 mg Oral QHS Debby Crosley, MD      . zolpidem (AMBIEN) tablet 10 mg  10 mg Oral QHS Debby Crosley, MD       No Known Allergies Principal Problem:  *Anticoagulant long-term use Active Problems:  PULMONARY HYPERTENSION, SECONDARY  Atrial flutter  COPD UNSPECIFIED  Pulmonary embolism  Morbid obesity  Hematoma  Blood pressure 109/68, pulse 85, temperature 98  F (36.7 C), temperature source Oral, resp. rate 18, height 5' 7.5" (1.715 m), weight 101.606 kg (224 lb), SpO2 99.00%.  Subjective [name] was seen in ED back on Saturday for a dislocation of his left total hip.  He states that he was sitting in his recliner and went to get up.  He had already stood up and had both feet on the floor and went to take his first step with that leg when it gave way.  He had a reduction performed by Dr. Shelle Iron on Saturday and went back home.  Ever since that dislocation, his hip has continued to increase in pain requiring a return visit to the hospital.  He was seen and underwent CT scan which has been reviewed by D. Olin per the ED notes.   CT SCAN - Hematoma surrounding the posterior left hip with a small amount of retroperitoneal blood in the left hemi pelvis.  There appears to be fluid-blood levels in the superior aspect of this collection, best seen on image 73. There is subcutaneous swelling in  the soft tissues lateral to the left hip. There is high density stranding in the left retroperitoneum that extends up to the aortic bifurcation. Findings are consistent with a left retroperitoneal hematoma. After talking with the patient, he is currently on Coumadin for A.Fib and recently diagnosed PE last fall.  He was off the Coumadin for a while but was placed back on it last November as per patient.  In the past, Dr. Denzil Magnuson, his PCP, had been following his INR which had been stable for quite some time on the same dosage.  Since starting it back last fall, he does recall a high INR around 6.5 after initial treatment but became regulated and recall his INR's of the last draws have been 2.1, 2.4, and most recently 1.8 this past month (all this info comes from the patient's history).  He denies any change of his medications or diet or recent illnesses since his last INR of 1.8 and the recent admission when it was found to be 6.5 again.  The pain appears to be coming from the dislocation and traumatic bleed into the tissues from his super therapeutic INR.  He will likely need this washed out and decompressed surgically.  Notes states that he is off his Coumadin and already received FFP.  Will recheck his INR levels.  Will ask medicine to see if they can level out his INR for potential surgery tomorrow.  Objective Left leg shows moderate swelling in lateral hip which is firm and tender to palpation, compartments are firm in proximal thigh.  Pulse are intact distally and sensation is intact throughout the left leg.  Assessment & Plan Hematoma Left hip secondary to Dislocation Left Total Hip Replacement with Recent Dislocation Super Therapeutic INR on admission.  Will discuss findings and situation with Dr. Lequita Halt.  Will likely need surgical decompression and washout of hip.  Monitor his INR and plan for possible I&D tomorrow afternoon if medical stable and INR is leveled out.     [name] 09/23/2011

## 2011-09-23 NOTE — Progress Notes (Signed)
   CARE MANAGEMENT NOTE 09/23/2011  Patient:  Francisco Tanner, Francisco Tanner   Account Number:  [id number]  Date Initiated:  09/23/2011  Documentation initiated by:  Lanier Clam  Subjective/Objective Assessment:   ADMITTED W/L HIP PAIN.HX:L TOTAL HIP REPLACEMENT.     Action/Plan:   FROM HOME ALONE,BROTHER LIVES LOCALLY.HAS PCP,PHARMACY,CANE,RW.   Anticipated DC Date:  09/29/2011   Anticipated DC Plan:  HOME/SELF CARE         Choice offered to / List presented to:             Status of service:  In process, will continue to follow Medicare Important Message given?   (If response is "NO", the following Medicare IM given date fields will be blank) Date Medicare IM given:   Date Additional Medicare IM given:    Discharge Disposition:    Per UR Regulation:  Reviewed for med. necessity/level of care/duration of stay  Comments:  09/23/11 Raeley Gilmore RN,BSN NCM [phone] ORTHO FOLLOWING-I&D IN AM IF INR LEVEL APPROPRIATE.

## 2011-09-23 NOTE — Progress Notes (Signed)
Subjective: Pt well known to me from previous admission. Appreciate ortho input. Pt has no complaints. He is anticipating washout of hip by ortho hopefully in 48 hours. Objective: Filed Vitals:   09/23/11 0525 09/23/11 0625 09/23/11 0808 09/23/11 1503  BP: 118/68 109/68  110/59  Pulse: 91 85  80  Temp: 97.8 F (36.6 C) 98 F (36.7 C)  98.1 F (36.7 C)  TempSrc: Oral Oral  Oral  Resp: 18 18  20   Height:      Weight:      SpO2: 95%  99% 96%   Weight change:   Intake/Output Summary (Last 24 hours) at 09/23/11 2030 Last data filed at 09/23/11 1822  Gross per 24 hour  Intake 1747.5 ml  Output   1376 ml  Net  371.5 ml    General: Alert, awake, oriented x3, in no acute distress.  HEENT: [place]/AT PEERL, EOMI Neck: Trachea midline,  no masses, no thyromegal,y no JVD, no carotid bruit OROPHARYNX:  Moist, No exudate/ erythema/lesions.  Heart: Regular rate and rhythm, without murmurs, rubs, gallops, PMI non-displaced, no heaves or thrills on palpation.  Lungs: Clear to auscultation, no wheezing or rhonchi noted. No increased vocal fremitus resonant to percussion  Abdomen: Soft, nontender, nondistended, positive bowel sounds, no masses no hepatosplenomegaly noted..  Neuro: No focal neurological deficits noted cranial nerves II through XII grossly intact. DTRs 2+ bilaterally upper and lower extremities. Strength 5 out of 5 in bilateral upper and lower extremities. Musculoskeletal: Left hip tender around the joint..     Lab Results:  Basename 09/23/11 0440 09/22/11 1655  NA 136 137  K 3.4* 3.6  CL 98 97  CO2 26 30  GLUCOSE 88 102*  BUN 8 10  CREATININE 0.50 0.55  CALCIUM 7.9* 8.5  MG -- --  PHOS -- --   No results found for this basename: AST:2,ALT:2,ALKPHOS:2,BILITOT:2,PROT:2,ALBUMIN:2 in the last 72 hours No results found for this basename: LIPASE:2,AMYLASE:2 in the last 72 hours  Basename 09/23/11 0917 09/23/11 0440 09/22/11 1655  WBC -- 7.5 7.7  NEUTROABS -- -- --  HGB  8.0* 7.9* --  HCT 24.4* 25.0* --  MCV -- 90.9 90.2  PLT -- 190 208   No results found for this basename: CKTOTAL:3,CKMB:3,CKMBINDEX:3,TROPONINI:3 in the last 72 hours No components found with this basename: POCBNP:3 No results found for this basename: DDIMER:2 in the last 72 hours No results found for this basename: HGBA1C:2 in the last 72 hours No results found for this basename: CHOL:2,HDL:2,LDLCALC:2,TRIG:2,CHOLHDL:2,LDLDIRECT:2 in the last 72 hours No results found for this basename: TSH,T4TOTAL,FREET3,T3FREE,THYROIDAB in the last 72 hours No results found for this basename: VITAMINB12:2,FOLATE:2,FERRITIN:2,TIBC:2,IRON:2,RETICCTPCT:2 in the last 72 hours  Micro Results: No results found for this or any previous visit (from the past 240 hour(s)).  Studies/Results: Ct Abdomen Pelvis Wo Contrast  09/22/2011  *RADIOLOGY REPORT*  Clinical Data: Recent left hip dislocation and reduction.  Left lower abdominal pain.  CT ABDOMEN AND PELVIS WITHOUT CONTRAST  Technique:  Multidetector CT imaging of the abdomen and pelvis was performed following the standard protocol without intravenous contrast.  Comparison: Chest CT 02/07/2011  Findings: There is a ground-glass opacity in the right lower lung which is incompletely imaged.   A stable subpleural nodular density along the right hemidiaphragm.  No evidence for free air.  There is a subtle 8 mm low density in the anterior liver on image 23 which appears unchanged. There is also a stable round 1.5 cm low density structure in the caudate  which most likely represents a cyst.  No gross abnormality to the gallbladder, pancreas or spleen. Stable nodularity of the left adrenal gland with Hounsfield units around 10.  This most likely represents an adenoma.  There are two 3 mm calcifications in the right kidney.  Small exophytic low- density structure along the posterior right kidney roughly measures 7 mm.  No evidence of left kidney stones.  No evidence of  hydronephrosis.  Diffuse diverticulosis in the left colon.  There is high-density material surrounding the posterior aspect of the left hip and proximal left femur.  Findings are most compatible with hematoma formation.  There appears to be fluid-blood levels in the superior aspect of this collection, best seen on image 73. There is subcutaneous swelling in the soft tissues lateral to the left hip.  There is high density stranding in the left retroperitoneum that extends up to the aortic bifurcation. Findings are consistent with a left retroperitoneal hematoma. There is an unusual configuration of the left external iliac artery on image 64 which may be related to an adjacent small lymph node. Aneurysm in this area cannot be excluded on this noncontrast examination.  The iliac vessels are heavily calcified.  The left hip prosthesis is located and the right hip is located. Compression fractures at T12, L1, L2 and L3 with bone cement. 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  There may be mild compression deformity at L5 as well.  IMPRESSION: Hematoma surrounding the posterior left hip with a small amount of retroperitoneal blood in the left hemi pelvis.  Unusual configuration of the left external iliac artery probably related to an adjacent lymph node but cannot exclude a small aneurysm in this area.  This area could be further evaluated with a postcontrast CT examination.  Ground-glass opacity in the right lower lobe is incompletely imaged.  This is new from the prior examination. This could represent an infectious or inflammatory process of unknown age. The patient would benefit from a follow-up chest CT for further evaluation.  Please note that ground-glass opacities could also be associated with lung neoplasms.  Calcifications in the right right kidney may represent nonobstructing stones.  Too small to definitely characterize low density structures in the liver and right kidney.   Original Report Authenticated By: Richarda Overlie, M.D.   Dg Lumbar Spine Complete  09/22/2011  *RADIOLOGY REPORT*  Clinical Data: Low back and left PET hip pain, recent dislocation of left hip replacement  LUMBAR SPINE - COMPLETE 4+ VIEW  Comparison: Lumbar spine films of 10/27/2009  Findings: Vertebroplasties have been performed at T12, L1, L2, and L3 levels.  Compared to the post vertebroplasty images from 05/28/2011, the degree of compressions of these lumbar vertebral bodies appears stable.  No new definite compression deformity is seen.  Pars defects at L5 again are noted.  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  No new compression deformity. Diffuse osteopenia.  Original Report Authenticated By: Juline Patch, M.D.   Dg Hip Complete Left  09/22/2011  *RADIOLOGY REPORT*  Clinical Data: Left hip pain, recent dislocation  LEFT HIP - COMPLETE 2+ VIEW  Comparison: Portable left hip films of 09/20/2011  Findings: The femoral and acetabular components of the left hip replacement are in good position.  No acute fracture is seen.  No dislocation is noted.  The bones are very osteopenic.  IMPRESSION: Left total hip replacement in good position .  No acute abnormality.  Osteopenia.  Original Report Authenticated By: Renae Fickle  D. Gery Pray, M.D.   Dg Hip Complete Left  09/20/2011  *RADIOLOGY REPORT*  Clinical Data: Fall, pain  LEFT HIP - COMPLETE 2+ VIEW  Comparison: 01/10/2009  Findings: Left total hip replacement was performed in 03/12/2009. There is superior dislocation of the femoral component not present previously.  No visible loosening or fracture.  IMPRESSION: Superior dislocation of left T H R femoral component.  Original Report Authenticated By: Elsie Stain, M.D.   Ct Pelvis W Contrast  09/22/2011  *RADIOLOGY REPORT*  Clinical Data:  Hematoma surrounding the postoperative left hip joint, with associated retroperitoneal blood.  Question of abnormal configuration of the left external iliac artery on  prior CT. Contrast study recommended for further evaluation.  CT PELVIS WITH CONTRAST  Technique:  Multidetector CT imaging of the pelvis was performed using the standard protocol following the bolus administration of intravenous contrast.  Contrast:   100 mL of Omnipaque 300 IV contrast  Comparison:  CT of the abdomen and pelvis performed earlier today at 07:53 p.m.  Findings:  A large hematoma is again noted within the proximal left thigh musculature surrounding the patient's left hip prosthesis; this appears slightly increased in size from the recent CT of the abdomen and pelvis, measuring approximately 10.9 x 10.0 cm.  The inferior extent of the hematoma is not seen on this study.  The patient's left hip total arthroplasty appears grossly intact, though incompletely imaged on this study.  Suggested cortical irregularity along the left superior pubic ramus appears to be artifactual in nature, on correlation with coronal images. There is no definite evidence of new fracture.  The degree of retroperitoneal stranding and hemorrhage is relatively stable from the prior study.  This remains relatively mild.  However, note is made of an inflamed diverticulum along the proximal sigmoid colon, with surrounding soft tissue stranding. Some of the findings at the left lower quadrant could reflect mild acute diverticulitis, though the pelvic sidewall stranding likely reflects mild hemorrhage.  The small nodular density along the left external iliac artery reflects a small lymph node; the configuration of the left external iliac artery remains within normal limits.  There is prominent intramural thrombus and diffuse calcification along the distal abdominal aorta and its branches; likely severe stenosis is noted along the right common and external iliac arteries.  Diffuse diverticulosis involves the visualized distal descending and sigmoid colon. Visualized small bowel loops are grossly unremarkable.  The bladder is mildly  distended; mild surrounding soft tissue stranding is nonspecific in appearance.  There is mild grade 1 anterolisthesis of L5 on S1, with chronic bilateral pars defects.  IMPRESSION:  1.  Slight interval increase in size of large partially intramuscular hematoma surrounding the left hip prosthesis and extending inferiorly along the proximal left thigh, measuring 10.9 x 10.0 cm; the inferior extent of the hematoma is not characterized on this study. 2.  Relatively mild retroperitoneal stranding and hemorrhage is relatively stable in appearance.  Note made of an inflamed diverticula along the proximal sigmoid colon, with surrounding soft tissue stranding.  This could conceivably reflect mild acute diverticulitis, depending on the patient's symptoms. 3.  Small nodular density along the left external iliac artery reflects a small lymph node; the configuration of the left external iliac artery remains within normal limits. 4.  Prominent intramural thrombus and diffuse calcification along the distal abdominal aorta and its branches; likely severe stenosis noted along the right common and external iliac arteries. 5.  Diffuse diverticulosis involves the distal descending and sigmoid  colon. 6.  Mild grade 1 anterolisthesis of L5 on S1, with chronic bilateral pars defects.  These results were called by telephone on 09/21/2010  at  10:40 p.m. to  Dr. Quita Skye, who verbally acknowledged these results.  Original Report Authenticated By: Tonia Ghent, M.D.   Dg Hip Portable 1 View Left  09/20/2011  *RADIOLOGY REPORT*  Clinical Data: Left hip pain  PORTABLE LEFT HIP - 1 VIEW  Comparison: Portable film earlier in the day  Findings: Hip dislocation has been reduced.  The femoral component of the T H R lies within the acetabular fossa.  There is no fracture.  IMPRESSION: Reduced left T H R.  Original Report Authenticated By: Elsie Stain, M.D.    Medications: I have reviewed the patient's current medications. Scheduled  Meds:   . budesonide-formoterol  2 puff Inhalation BID  . ferrous sulfate  325 mg Oral Daily  . metoprolol succinate  75 mg Oral Daily  . oxybutynin  5 mg Oral Daily  . pantoprazole  40 mg Oral Q1200  . Tamsulosin HCl  0.4 mg Oral QHS  . zolpidem  10 mg Oral QHS   Continuous Infusions:   . 0.9 % NaCl with KCl 20 mEq / L 75 mL/hr at 09/23/11 1635   PRN Meds:.albuterol, HYDROcodone-acetaminophen, HYDROmorphone (DILAUDID) injection, HYDROmorphone, iohexol, ipratropium, ondansetron (ZOFRAN) IV, ondansetron, oxyCODONE-acetaminophen Assessment/Plan: Patient Active Hospital Problem List: Anticoagulant long-term use (09/22/2011)   Assessment: Coumadin on hold at present. Will need to confer with ortho about resuming once surgically stable.    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 He is at high risk and may need to be considere for an IVC filter if not a coumadin candidate.   Hematoma (09/22/2011)   Assessment: Ortho to perform washout of Lt. Hip join when INR reasonable.     LOS: 1 day

## 2011-09-24 ENCOUNTER — Encounter (HOSPITAL_COMMUNITY): Payer: Self-pay | Admitting: Anesthesiology

## 2011-09-24 ENCOUNTER — Inpatient Hospital Stay (HOSPITAL_COMMUNITY): Payer: Medicare Other | Admitting: Anesthesiology

## 2011-09-24 ENCOUNTER — Encounter (HOSPITAL_COMMUNITY)
Admission: EM | Disposition: A | Discharge status: Skilled Nursing Facility | Payer: Self-pay | Source: Home / Self Care | Attending: Internal Medicine

## 2011-09-24 HISTORY — PX: INCISION AND DRAINAGE HIP: SHX1801

## 2011-09-24 LAB — CBC
Hemoglobin: 7.7 g/dL — ABNORMAL LOW (ref 13.0–17.0)
MCH: 29.3 pg (ref 26.0–34.0)
MCHC: 31.6 g/dL (ref 30.0–36.0)
Platelets: 190 10*3/uL (ref 150–400)
RDW: 14.6 % (ref 11.5–15.5)

## 2011-09-24 LAB — PROTIME-INR
INR: 2.24 — ABNORMAL HIGH (ref 0.00–1.49)
Prothrombin Time: 25.2 seconds — ABNORMAL HIGH (ref 11.6–15.2)

## 2011-09-24 LAB — PREPARE FRESH FROZEN PLASMA: Unit division: 0

## 2011-09-24 SURGERY — IRRIGATION AND DEBRIDEMENT HIP
Anesthesia: General | Site: Hip | Laterality: Left | Wound class: Clean

## 2011-09-24 MED ORDER — DOCUSATE SODIUM 100 MG PO CAPS
100.0000 mg | ORAL_CAPSULE | Freq: Two times a day (BID) | ORAL | Status: DC
  Administered 2011-09-25 – 2011-09-29 (×10): 100 mg via ORAL
  Filled 2011-09-24 (×11): qty 1

## 2011-09-24 MED ORDER — CEFAZOLIN SODIUM 1-5 GM-% IV SOLN
1.0000 g | Freq: Once | INTRAVENOUS | Status: AC
  Administered 2011-09-24: 2 g via INTRAVENOUS

## 2011-09-24 MED ORDER — ACETAMINOPHEN 325 MG PO TABS
650.0000 mg | ORAL_TABLET | Freq: Once | ORAL | Status: AC
  Administered 2011-09-24: 650 mg via ORAL
  Filled 2011-09-24: qty 2

## 2011-09-24 MED ORDER — SUCCINYLCHOLINE CHLORIDE 20 MG/ML IJ SOLN
INTRAMUSCULAR | Status: DC | PRN
  Administered 2011-09-24: 100 mg via INTRAVENOUS

## 2011-09-24 MED ORDER — LACTATED RINGERS IV SOLN
INTRAVENOUS | Status: DC | PRN
  Administered 2011-09-24: 19:00:00 via INTRAVENOUS

## 2011-09-24 MED ORDER — DIPHENHYDRAMINE HCL 25 MG PO CAPS
25.0000 mg | ORAL_CAPSULE | Freq: Once | ORAL | Status: AC
  Administered 2011-09-24: 25 mg via ORAL
  Filled 2011-09-24: qty 1

## 2011-09-24 MED ORDER — MEPERIDINE HCL 25 MG/ML IJ SOLN: 6.2500 mg | INTRAMUSCULAR | Status: DC | PRN

## 2011-09-24 MED ORDER — LACTATED RINGERS IV SOLN: INTRAVENOUS | Status: DC

## 2011-09-24 MED ORDER — ONDANSETRON HCL 4 MG PO TABS: 4.0000 mg | ORAL_TABLET | Freq: Four times a day (QID) | ORAL | Status: DC | PRN

## 2011-09-24 MED ORDER — ONDANSETRON HCL 4 MG/2ML IJ SOLN
INTRAMUSCULAR | Status: DC | PRN
  Administered 2011-09-24: 4 mg via INTRAVENOUS

## 2011-09-24 MED ORDER — METOCLOPRAMIDE HCL 10 MG PO TABS: 5.0000 mg | ORAL_TABLET | Freq: Three times a day (TID) | ORAL | Status: DC | PRN

## 2011-09-24 MED ORDER — METHOCARBAMOL 100 MG/ML IJ SOLN
500.0000 mg | Freq: Four times a day (QID) | INTRAVENOUS | Status: DC | PRN
  Filled 2011-09-24: qty 5

## 2011-09-24 MED ORDER — PROMETHAZINE HCL 25 MG/ML IJ SOLN: 6.2500 mg | INTRAMUSCULAR | Status: DC | PRN

## 2011-09-24 MED ORDER — CEFAZOLIN SODIUM 1-5 GM-% IV SOLN
1.0000 g | Freq: Four times a day (QID) | INTRAVENOUS | Status: AC
  Administered 2011-09-25 (×3): 1 g via INTRAVENOUS
  Filled 2011-09-24 (×3): qty 50

## 2011-09-24 MED ORDER — BISACODYL 10 MG RE SUPP: 10.0000 mg | Freq: Every day | RECTAL | Status: DC | PRN

## 2011-09-24 MED ORDER — HYDROMORPHONE HCL PF 1 MG/ML IJ SOLN
0.2500 mg | INTRAMUSCULAR | Status: DC | PRN
  Administered 2011-09-24 (×4): 0.5 mg via INTRAVENOUS

## 2011-09-24 MED ORDER — METOCLOPRAMIDE HCL 5 MG/ML IJ SOLN: 5.0000 mg | Freq: Three times a day (TID) | INTRAMUSCULAR | Status: DC | PRN

## 2011-09-24 MED ORDER — METHOCARBAMOL 500 MG PO TABS
500.0000 mg | ORAL_TABLET | Freq: Four times a day (QID) | ORAL | Status: DC | PRN
  Administered 2011-09-25 – 2011-09-28 (×4): 500 mg via ORAL
  Filled 2011-09-24 (×4): qty 1

## 2011-09-24 MED ORDER — FENTANYL CITRATE 0.05 MG/ML IJ SOLN
INTRAMUSCULAR | Status: DC | PRN
  Administered 2011-09-24 (×2): 50 ug via INTRAVENOUS

## 2011-09-24 MED ORDER — FLEET ENEMA 7-19 GM/118ML RE ENEM: 1.0000 | ENEMA | Freq: Once | RECTAL | Status: AC | PRN

## 2011-09-24 MED ORDER — POLYETHYLENE GLYCOL 3350 17 G PO PACK
17.0000 g | PACK | Freq: Every day | ORAL | Status: DC | PRN
  Filled 2011-09-24: qty 1

## 2011-09-24 MED ORDER — VITAMIN K1 10 MG/ML IJ SOLN
5.0000 mg | Freq: Once | INTRAVENOUS | Status: AC
  Administered 2011-09-24: 5 mg via INTRAVENOUS
  Filled 2011-09-24: qty 0.5

## 2011-09-24 MED ORDER — ONDANSETRON HCL 4 MG/2ML IJ SOLN: 4.0000 mg | Freq: Four times a day (QID) | INTRAMUSCULAR | Status: DC | PRN

## 2011-09-24 MED ORDER — PROPOFOL 10 MG/ML IV BOLUS
INTRAVENOUS | Status: DC | PRN
  Administered 2011-09-24: 120 mg via INTRAVENOUS

## 2011-09-24 MED ORDER — ENOXAPARIN SODIUM 40 MG/0.4ML ~~LOC~~ SOLN
40.0000 mg | SUBCUTANEOUS | Status: DC
  Administered 2011-09-25: 40 mg via SUBCUTANEOUS
  Filled 2011-09-24: qty 0.4

## 2011-09-24 SURGICAL SUPPLY — 29 items
BAG SPEC THK2 15X12 ZIP CLS (MISCELLANEOUS) ×1
BAG ZIPLOCK 12X15 (MISCELLANEOUS) ×2 IMPLANT
CLOTH BEACON ORANGE TIMEOUT ST (SAFETY) ×2 IMPLANT
DRSG ADAPTIC 3X8 NADH LF (GAUZE/BANDAGES/DRESSINGS) ×2 IMPLANT
DRSG MEPILEX BORDER 4X8 (GAUZE/BANDAGES/DRESSINGS) ×1 IMPLANT
DURAPREP 26ML APPLICATOR (WOUND CARE) ×2 IMPLANT
ELECT REM PT RETURN 9FT ADLT (ELECTROSURGICAL) ×2
ELECTRODE REM PT RTRN 9FT ADLT (ELECTROSURGICAL) ×1 IMPLANT
EVACUATOR 1/8 PVC DRAIN (DRAIN) ×3 IMPLANT
GAUZE SPONGE 4X4 12PLY STRL LF (GAUZE/BANDAGES/DRESSINGS) ×1 IMPLANT
GLOVE BIO SURGEON STRL SZ7.5 (GLOVE) ×2 IMPLANT
GLOVE BIOGEL M 8.0 STRL (GLOVE) ×4 IMPLANT
GOWN STRL NON-REIN LRG LVL3 (GOWN DISPOSABLE) ×2 IMPLANT
GOWN STRL REIN XL XLG (GOWN DISPOSABLE) ×2 IMPLANT
HANDPIECE INTERPULSE COAX TIP (DISPOSABLE) ×2
IMMOBILIZER KNEE 22  40 CIR (ORTHOPEDIC SUPPLIES) ×1
IMMOBILIZER KNEE 22 40 CIR (ORTHOPEDIC SUPPLIES) ×1 IMPLANT
IMMOBILIZER KNEE 22 UNIV (SOFTGOODS) ×1 IMPLANT
KIT BASIN OR (CUSTOM PROCEDURE TRAY) ×2 IMPLANT
MANIFOLD NEPTUNE II (INSTRUMENTS) ×2 IMPLANT
PACK TOTAL JOINT (CUSTOM PROCEDURE TRAY) ×2 IMPLANT
POSITIONER SURGICAL ARM (MISCELLANEOUS) ×2 IMPLANT
SET HNDPC FAN SPRY TIP SCT (DISPOSABLE) ×1 IMPLANT
STAPLER VISISTAT 35W (STAPLE) ×2 IMPLANT
SUT VIC AB 1 CT1 27 (SUTURE) ×6
SUT VIC AB 1 CT1 27XBRD ANTBC (SUTURE) ×3 IMPLANT
SUT VIC AB 2-0 CT1 27 (SUTURE) ×6
SUT VIC AB 2-0 CT1 TAPERPNT 27 (SUTURE) ×3 IMPLANT
TOWEL OR 17X26 10 PK STRL BLUE (TOWEL DISPOSABLE) ×5 IMPLANT

## 2011-09-24 NOTE — Interval H&P Note (Signed)
History and Physical Interval Note:  09/24/2011 6:45 PM  Francisco Tanner  has presented today for surgery, with the diagnosis of hematoma left hip  The various methods of treatment have been discussed with the patient and family. After consideration of risks, benefits and other options for treatment, the patient has consented to  Procedure(s) (LRB): IRRIGATION AND DEBRIDEMENT HIP (Left) as a surgical intervention .  The patients' history has been reviewed, patient examined, no change in status, stable for surgery.  I have reviewed the patients' chart and labs.  Questions were answered to the patient's satisfaction.     Loanne Drilling

## 2011-09-24 NOTE — Progress Notes (Signed)
Subjective: Still c/o left hip pain. Hb 7.7 today.  Objective: Vital signs in last 24 hours: Temp:  [97.6 F (36.4 C)-98.3 F (36.8 C)] 98.1 F (36.7 C) (02/27 1305) Pulse Rate:  [73-86] 74  (02/27 1305) Resp:  [18-20] 18  (02/27 1305) BP: (100-125)/(59-73) 100/61 mmHg (02/27 1305) SpO2:  [85 %-98 %] 85 % (02/27 1019) Weight change:  Last BM Date: 09/23/11  Intake/Output from previous day: 02/26 0701 - 02/27 0700 In: 2225 [P.O.:500; I.V.:1725] Out: 1481 [Urine:1480; Stool:1] Total I/O In: 137.5 [Blood:137.5] Out: 200 [Urine:200]   Physical Exam: General: Alert, awake, oriented x3, in no acute distress. HEENT: No bruits, no goiter. Heart: Regular rate and rhythm, without murmurs, rubs, gallops. Lungs: Clear to auscultation bilaterally. Abdomen: Soft, nontender, nondistended, positive bowel sounds. Extremities: No clubbing cyanosis or edema with positive pedal pulses. Neuro: Grossly intact, nonfocal.    Lab Results: Basic Metabolic Panel:  Basename 09/23/11 0440 09/22/11 1655  NA 136 137  K 3.4* 3.6  CL 98 97  CO2 26 30  GLUCOSE 88 102*  BUN 8 10  CREATININE 0.50 0.55  CALCIUM 7.9* 8.5  MG -- --  PHOS -- --   CBC:  Basename 09/24/11 0812 09/23/11 0917 09/23/11 0440  WBC 5.1 -- 7.5  NEUTROABS -- -- --  HGB 7.7* 8.0* --  HCT 24.4* 24.4* --  MCV 92.8 -- 90.9  PLT 190 -- 190   Coagulation:  Basename 09/24/11 0812 09/23/11 0917  LABPROT 25.2* 27.1*  INR 2.24* 2.46*    Studies/Results: Ct Abdomen Pelvis Wo Contrast  09/22/2011  *RADIOLOGY REPORT*  Clinical Data: Recent left hip dislocation and reduction.  Left lower abdominal pain.  CT ABDOMEN AND PELVIS WITHOUT CONTRAST  Technique:  Multidetector CT imaging of the abdomen and pelvis was performed following the standard protocol without intravenous contrast.  Comparison: Chest CT 02/07/2011  Findings: There is a ground-glass opacity in the right lower lung which is incompletely imaged.   A stable  subpleural nodular density along the right hemidiaphragm.  No evidence for free air.  There is a subtle 8 mm low density in the anterior liver on image 23 which appears unchanged. There is also a stable round 1.5 cm low density structure in the caudate which most likely represents a cyst.  No gross abnormality to the gallbladder, pancreas or spleen. Stable nodularity of the left adrenal gland with Hounsfield units around 10.  This most likely represents an adenoma.  There are two 3 mm calcifications in the right kidney.  Small exophytic low- density structure along the posterior right kidney roughly measures 7 mm.  No evidence of left kidney stones.  No evidence of hydronephrosis.  Diffuse diverticulosis in the left colon.  There is high-density material surrounding the posterior aspect of the left hip and proximal left femur.  Findings are most compatible with hematoma formation.  There appears to be fluid-blood levels in the superior aspect of this collection, best seen on image 73. There is subcutaneous swelling in the soft tissues lateral to the left hip.  There is high density stranding in the left retroperitoneum that extends up to the aortic bifurcation. Findings are consistent with a left retroperitoneal hematoma. There is an unusual configuration of the left external iliac artery on image 64 which may be related to an adjacent small lymph node. Aneurysm in this area cannot be excluded on this noncontrast examination.  The iliac vessels are heavily calcified.  The left hip prosthesis is located and the  right hip is located. Compression fractures at T12, L1, L2 and L3 with bone cement. 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  There may be mild compression deformity at L5 as well.  IMPRESSION: Hematoma surrounding the posterior left hip with a small amount of retroperitoneal blood in the left hemi pelvis.  Unusual configuration of the left external iliac artery probably  related to an adjacent lymph node but cannot exclude a small aneurysm in this area.  This area could be further evaluated with a postcontrast CT examination.  Ground-glass opacity in the right lower lobe is incompletely imaged.  This is new from the prior examination. This could represent an infectious or inflammatory process of unknown age. The patient would benefit from a follow-up chest CT for further evaluation.  Please note that ground-glass opacities could also be associated with lung neoplasms.  Calcifications in the right right kidney may represent nonobstructing stones.  Too small to definitely characterize low density structures in the liver and right kidney.  Original Report Authenticated By: Richarda Overlie, M.D.   Dg Lumbar Spine Complete  09/22/2011  *RADIOLOGY REPORT*  Clinical Data: Low back and left PET hip pain, recent dislocation of left hip replacement  LUMBAR SPINE - COMPLETE 4+ VIEW  Comparison: Lumbar spine films of 10/27/2009  Findings: Vertebroplasties have been performed at T12, L1, L2, and L3 levels.  Compared to the post vertebroplasty images from 05/28/2011, the degree of compressions of these lumbar vertebral bodies appears stable.  No new definite compression deformity is seen.  Pars defects at L5 again are noted.  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  No new compression deformity. Diffuse osteopenia.  Original Report Authenticated By: Juline Patch, M.D.   Dg Hip Complete Left  09/22/2011  *RADIOLOGY REPORT*  Clinical Data: Left hip pain, recent dislocation  LEFT HIP - COMPLETE 2+ VIEW  Comparison: Portable left hip films of 09/20/2011  Findings: The femoral and acetabular components of the left hip replacement are in good position.  No acute fracture is seen.  No dislocation is noted.  The bones are very osteopenic.  IMPRESSION: Left total hip replacement in good position .  No acute abnormality.  Osteopenia.  Original Report Authenticated By: Juline Patch, M.D.   Ct  Pelvis W Contrast  09/22/2011  *RADIOLOGY REPORT*  Clinical Data:  Hematoma surrounding the postoperative left hip joint, with associated retroperitoneal blood.  Question of abnormal configuration of the left external iliac artery on prior CT. Contrast study recommended for further evaluation.  CT PELVIS WITH CONTRAST  Technique:  Multidetector CT imaging of the pelvis was performed using the standard protocol following the bolus administration of intravenous contrast.  Contrast:   100 mL of Omnipaque 300 IV contrast  Comparison:  CT of the abdomen and pelvis performed earlier today at 07:53 p.m.  Findings:  A large hematoma is again noted within the proximal left thigh musculature surrounding the patient's left hip prosthesis; this appears slightly increased in size from the recent CT of the abdomen and pelvis, measuring approximately 10.9 x 10.0 cm.  The inferior extent of the hematoma is not seen on this study.  The patient's left hip total arthroplasty appears grossly intact, though incompletely imaged on this study.  Suggested cortical irregularity along the left superior pubic ramus appears to be artifactual in nature, on correlation with coronal images. There is no definite evidence of new fracture.  The degree of retroperitoneal stranding and hemorrhage is relatively stable  from the prior study.  This remains relatively mild.  However, note is made of an inflamed diverticulum along the proximal sigmoid colon, with surrounding soft tissue stranding. Some of the findings at the left lower quadrant could reflect mild acute diverticulitis, though the pelvic sidewall stranding likely reflects mild hemorrhage.  The small nodular density along the left external iliac artery reflects a small lymph node; the configuration of the left external iliac artery remains within normal limits.  There is prominent intramural thrombus and diffuse calcification along the distal abdominal aorta and its branches; likely severe  stenosis is noted along the right common and external iliac arteries.  Diffuse diverticulosis involves the visualized distal descending and sigmoid colon. Visualized small bowel loops are grossly unremarkable.  The bladder is mildly distended; mild surrounding soft tissue stranding is nonspecific in appearance.  There is mild grade 1 anterolisthesis of L5 on S1, with chronic bilateral pars defects.  IMPRESSION:  1.  Slight interval increase in size of large partially intramuscular hematoma surrounding the left hip prosthesis and extending inferiorly along the proximal left thigh, measuring 10.9 x 10.0 cm; the inferior extent of the hematoma is not characterized on this study. 2.  Relatively mild retroperitoneal stranding and hemorrhage is relatively stable in appearance.  Note made of an inflamed diverticula along the proximal sigmoid colon, with surrounding soft tissue stranding.  This could conceivably reflect mild acute diverticulitis, depending on the patient's symptoms. 3.  Small nodular density along the left external iliac artery reflects a small lymph node; the configuration of the left external iliac artery remains within normal limits. 4.  Prominent intramural thrombus and diffuse calcification along the distal abdominal aorta and its branches; likely severe stenosis noted along the right common and external iliac arteries. 5.  Diffuse diverticulosis involves the distal descending and sigmoid colon. 6.  Mild grade 1 anterolisthesis of L5 on S1, with chronic bilateral pars defects.  These results were called by telephone on 09/21/2010  at  10:40 p.m. to  Dr. Quita Skye, who verbally acknowledged these results.  Original Report Authenticated By: Tonia Ghent, M.D.    Medications: Scheduled Meds:   . acetaminophen  650 mg Oral Once  . budesonide-formoterol  2 puff Inhalation BID  .  ceFAZolin (ANCEF) IV  1 g Intravenous Once  . diphenhydrAMINE  25 mg Oral Once  . ferrous sulfate  325 mg Oral  Daily  . metoprolol succinate  75 mg Oral Daily  . oxybutynin  5 mg Oral Daily  . pantoprazole  40 mg Oral Q1200  . phytonadione (VITAMIN K) IV  5 mg Intravenous Once  . Tamsulosin HCl  0.4 mg Oral QHS  . zolpidem  10 mg Oral QHS   Continuous Infusions:   . 0.9 % NaCl with KCl 20 mEq / L 75 mL/hr at 09/24/11 0543   PRN Meds:.albuterol, HYDROcodone-acetaminophen, HYDROmorphone (DILAUDID) injection, HYDROmorphone, ipratropium, ondansetron (ZOFRAN) IV, ondansetron, oxyCODONE-acetaminophen  Assessment/Plan:  Principal Problem:  *Anticoagulant long-term use Active Problems:  PULMONARY HYPERTENSION, SECONDARY  Atrial flutter  COPD UNSPECIFIED  Pulmonary embolism  Morbid obesity  Hematoma   #1 Left hip hematoma: Ortho is planning on I and D either this afternoon or tomorrow morning depending on INR. Has received vitamin K today. Coumadin on hold for now.  #2 ABLA: receiving blood transfusion now in anticipation of OR soon. Hb 7.7.  #3 Long-term coumadin use: had PE in July 2012; for this reason alone should not need any further coumadin. However, he has  had a h/o a flutter. Has been running NSR on telemetry in the hospital. For now, will need to be off coumadin for at least the next 2-3 weeks, but will need to see cardiology again as an outpatient for decision on when to restart coumadin. Called his PCP, Dr. Lysbeth Galas, no h/o a fib, altho there is a mention of a flutter in 2010.  #4 COPD: Stable.   LOS: 2 days   [facility] Pager: [phone] 09/24/2011, 2:10 PM

## 2011-09-24 NOTE — Progress Notes (Signed)
ANTICOAGULATION CONSULT NOTE - Initial Consult  Pharmacy Consult for Coumadin Indication: PE in July 2012/Atrial flutter  No Known Allergies  Patient Measurements: Height: 5' 7.5" (171.5 cm) Weight: 224 lb (101.606 kg) IBW/kg (Calculated) : 67.25    Vital Signs: Temp: 98.2 F (36.8 C) (02/27 2245) Temp src: Oral (02/27 2245) BP: 124/66 mmHg (02/27 2245) Pulse Rate: 72  (02/27 2245)  Labs:  Basename 09/24/11 1721 09/24/11 0812 09/23/11 0917 09/23/11 0440 09/22/11 1655  HGB -- 7.7* 8.0* -- --  HCT -- 24.4* 24.4* 25.0* --  PLT -- 190 -- 190 208  APTT -- -- -- -- --  LABPROT 18.8* 25.2* 27.1* -- --  INR 1.54* 2.24* 2.46* -- --  HEPARINUNFRC -- -- -- -- --  CREATININE -- -- -- 0.50 0.55  CKTOTAL -- -- -- -- --  CKMB -- -- -- -- --  TROPONINI -- -- -- -- --   Estimated Creatinine Clearance: 105.5 ml/min (by C-G formula based on Cr of 0.5).  Medical History: Past Medical History  Diagnosis Date  . CHF (congestive heart failure)   . COPD (chronic obstructive pulmonary disease)     Wert. PFTs 10/09/08 FEV1 1.39 (44%) ratio 40, DLCO 63%. 16% improvement after bronchodilator. overnight pulse ox 02/28/09 5:91m pent with sat< 89 sleeping > declined further o2 07/05/09. HFA 75% 04/23/09.   [name] Atrial flutter     echo 09/17/08 nl EF, mild LAE, RV mod dilated, pasystolic 46. noct desat 02/28/09, refused 02 @ hs   . Abnormal CT scan     SPN right mid zone. first detected 08/16/08; see [facility]   . Weight gain     target weight 196; BMI <30  . Lung nodule   . Hypertension     Medications:  Scheduled:    . acetaminophen  650 mg Oral Once  . budesonide-formoterol  2 puff Inhalation BID  .  ceFAZolin (ANCEF) IV  1 g Intravenous Once  .  ceFAZolin (ANCEF) IV  1 g Intravenous Q6H  . diphenhydrAMINE  25 mg Oral Once  . docusate sodium  100 mg Oral BID  . enoxaparin  40 mg Subcutaneous Q24H  . metoprolol succinate  75 mg Oral Daily  . oxybutynin  5 mg Oral Daily  . pantoprazole  40 mg Oral  Q1200  . phytonadione (VITAMIN K) IV  5 mg Intravenous Once  . Tamsulosin HCl  0.4 mg Oral QHS  . zolpidem  10 mg Oral QHS  . DISCONTD: ferrous sulfate  325 mg Oral Daily   Infusions:    . 0.9 % NaCl with KCl 20 mEq / L 75 mL/hr at 09/24/11 0543  . lactated ringers      Assessment: 66 yo admitted with left hip hematoma. Ortho now reordering coumadin for DVT prophylaxis Goal of Therapy:  INR 2-3   Plan:   Coumadin 2mg  x1 this am.  Daily PT/INR.  Verify if just for post-op prophylaxis or if pt to continue for hx PE or possible A-fib/flutter??  Lorenza Evangelist 09/24/2011,11:34 PM

## 2011-09-24 NOTE — Brief Op Note (Signed)
09/22/2011 - 09/24/2011  7:41 PM  PATIENT:  Francisco Tanner  66 y.o. male  PRE-OPERATIVE DIAGNOSIS:  hematoma left hip  POST-OPERATIVE DIAGNOSIS:  hematoma left hip  PROCEDURE:  Procedure(s) (LRB): IRRIGATION AND DEBRIDEMENT HIP (Left)  SURGEON:  Surgeon(s) and Role:    * Loanne Drilling, MD - Primary  PHYSICIAN ASSISTANT:   ASSISTANTS: Dimitri Ped, PA-C   ANESTHESIA:   general  EBL:  Total I/O In: 400 [I.V.:400] Out: 250 [Blood:250]  BLOOD ADMINISTERED:none  DRAINS: (Medium) Hemovact drain(s) in the left hip with  Suction Open   LOCAL MEDICATIONS USED:  NONE  DICTATION: .Other Dictation: Dictation Number [id number]  PLAN OF CARE: Admit to inpatient   PATIENT DISPOSITION:  PACU - hemodynamically stable.

## 2011-09-24 NOTE — Anesthesia Preprocedure Evaluation (Signed)
Anesthesia Evaluation  Patient identified by MRN, date of birth, ID band Patient awake    Reviewed: Allergy & Precautions, H&P , NPO status , Patient's Chart, lab work & pertinent test results  Airway Mallampati: II TM Distance: >3 FB Neck ROM: Full    Dental No notable dental hx.    Pulmonary neg pulmonary ROS, COPD COPD inhaler, former smoker clear to auscultation  Pulmonary exam normal       Cardiovascular hypertension, Pt. on medications +CHF and neg cardio ROS + dysrhythmias Atrial Fibrillation Regular Normal    Neuro/Psych Negative Neurological ROS  Negative Psych ROS   GI/Hepatic negative GI ROS, Neg liver ROS,   Endo/Other  Negative Endocrine ROS  Renal/GU negative Renal ROS  Genitourinary negative   Musculoskeletal negative musculoskeletal ROS (+)   Abdominal   Peds negative pediatric ROS (+)  Hematology negative hematology ROS (+)   Anesthesia Other Findings   Reproductive/Obstetrics negative OB ROS                           Anesthesia Physical Anesthesia Plan  ASA: III  Anesthesia Plan: General   Post-op Pain Management:    Induction: Intravenous  Airway Management Planned: Oral ETT  Additional Equipment:   Intra-op Plan:   Post-operative Plan: Extubation in OR  Informed Consent: I have reviewed the patients History and Physical, chart, labs and discussed the procedure including the risks, benefits and alternatives for the proposed anesthesia with the patient or authorized representative who has indicated his/her understanding and acceptance.   Dental advisory given  Plan Discussed with: CRNA  Anesthesia Plan Comments:         Anesthesia Quick Evaluation

## 2011-09-24 NOTE — Progress Notes (Signed)
Subjective: Pt still has left hip pain and swelling   Objective: Vital signs in last 24 hours: Temp:  [97.6 F (36.4 C)-98.3 F (36.8 C)] 98.3 F (36.8 C) (02/27 0514) Pulse Rate:  [73-86] 86  (02/27 0514) Resp:  [18-20] 20  (02/27 0514) BP: (104-122)/(59-73) 104/64 mmHg (02/27 0514) SpO2:  [96 %-99 %] 96 % (02/27 0514)  Intake/Output from previous day: 02/26 0701 - 02/27 0700 In: 1565 [P.O.:440; I.V.:1125] Out: 1481 [Urine:1480; Stool:1] Intake/Output this shift:     Basename 09/23/11 0917 09/23/11 0440 09/22/11 1655  HGB 8.0* 7.9* 8.9*    Basename 09/23/11 0917 09/23/11 0440 09/22/11 1655  WBC -- 7.5 7.7  RBC -- 2.75* 3.06*  HCT 24.4* 25.0* --  PLT -- 190 208    Basename 09/23/11 0440 09/22/11 1655  NA 136 137  K 3.4* 3.6  CL 98 97  CO2 26 30  BUN 8 10  CREATININE 0.50 0.55  GLUCOSE 88 102*  CALCIUM 7.9* 8.5    Basename 09/23/11 0917 09/22/11 1619  LABPT -- --  INR 2.46* 3.58*    Neurologically intact Dorsiflexion/Plantar flexion intact Significant swelling left thigh. Compartments soft  Assessment/Plan: Left hip hematoma- Will require I & D. Would like to do this afternoon if INR is acceptable and if hemoglobin is OK. May require transfusion pre-op. I have ordered the labs this morning as none have been done. Will proceed based on results. If INR still elevated over 1.5 will need Vit K and may possibly need to have surgery delayed until tomorrow   Loanne Drilling 09/24/2011, 7:29 AM

## 2011-09-24 NOTE — H&P (View-Only) (Signed)
Subjective: Pt still has left hip pain and swelling   Objective: Vital signs in last 24 hours: Temp:  [97.6 F (36.4 C)-98.3 F (36.8 C)] 98.3 F (36.8 C) (02/27 0514) Pulse Rate:  [73-86] 86  (02/27 0514) Resp:  [18-20] 20  (02/27 0514) BP: (104-122)/(59-73) 104/64 mmHg (02/27 0514) SpO2:  [96 %-99 %] 96 % (02/27 0514)  Intake/Output from previous day: 02/26 0701 - 02/27 0700 In: 1565 [P.O.:440; I.V.:1125] Out: 1481 [Urine:1480; Stool:1] Intake/Output this shift:     Basename 09/23/11 0917 09/23/11 0440 09/22/11 1655  HGB 8.0* 7.9* 8.9*    Basename 09/23/11 0917 09/23/11 0440 09/22/11 1655  WBC -- 7.5 7.7  RBC -- 2.75* 3.06*  HCT 24.4* 25.0* --  PLT -- 190 208    Basename 09/23/11 0440 09/22/11 1655  NA 136 137  K 3.4* 3.6  CL 98 97  CO2 26 30  BUN 8 10  CREATININE 0.50 0.55  GLUCOSE 88 102*  CALCIUM 7.9* 8.5    Basename 09/23/11 0917 09/22/11 1619  LABPT -- --  INR 2.46* 3.58*    Neurologically intact Dorsiflexion/Plantar flexion intact Significant swelling left thigh. Compartments soft  Assessment/Plan: Left hip hematoma- Will require I & D. Would like to do this afternoon if INR is acceptable and if hemoglobin is OK. May require transfusion pre-op. I have ordered the labs this morning as none have been done. Will proceed based on results. If INR still elevated over 1.5 will need Vit K and may possibly need to have surgery delayed until tomorrow   Mariha Sleeper V 09/24/2011, 7:29 AM      

## 2011-09-24 NOTE — Preoperative (Signed)
Beta Blockers   Reason not to administer Beta Blockers:Not Applicable 

## 2011-09-24 NOTE — Transfer of Care (Signed)
Immediate Anesthesia Transfer of Care Note  Patient: Francisco Tanner  Procedure(s) Performed: Procedure(s) (LRB): IRRIGATION AND DEBRIDEMENT HIP (Left)  Patient Location: PACU  Anesthesia Type: General  Level of Consciousness: awake, alert , oriented and patient cooperative  Airway & Oxygen Therapy: Patient Spontanous Breathing and Patient connected to nasal cannula oxygen  Post-op Assessment: Report given to PACU RN and Post -op Vital signs reviewed and stable  Post vital signs: Reviewed and stable  Complications: No apparent anesthesia complications

## 2011-09-24 NOTE — Anesthesia Postprocedure Evaluation (Signed)
  Anesthesia Post-op Note  Patient: Francisco Tanner  Procedure(s) Performed: Procedure(s) (LRB): IRRIGATION AND DEBRIDEMENT HIP (Left)  Patient Location: PACU  Anesthesia Type: General  Level of Consciousness: awake and alert   Airway and Oxygen Therapy: Patient Spontanous Breathing  Post-op Pain: mild  Post-op Assessment: Post-op Vital signs reviewed, Patient's Cardiovascular Status Stable, Respiratory Function Stable, Patent Airway and No signs of Nausea or vomiting  Post-op Vital Signs: stable  Complications: No apparent anesthesia complications

## 2011-09-25 LAB — BASIC METABOLIC PANEL
BUN: 8 mg/dL (ref 6–23)
Chloride: 100 mEq/L (ref 96–112)
Glucose, Bld: 88 mg/dL (ref 70–99)
Potassium: 3.6 mEq/L (ref 3.5–5.1)

## 2011-09-25 LAB — CBC
HCT: 29.1 % — ABNORMAL LOW (ref 39.0–52.0)
Hemoglobin: 9.1 g/dL — ABNORMAL LOW (ref 13.0–17.0)
MCH: 28.8 pg (ref 26.0–34.0)
MCHC: 31.3 g/dL (ref 30.0–36.0)
MCV: 92.1 fL (ref 78.0–100.0)

## 2011-09-25 LAB — TYPE AND SCREEN: Unit division: 0

## 2011-09-25 MED ORDER — NON FORMULARY: 20.0000 mg | Freq: Every day | Status: DC

## 2011-09-25 MED ORDER — ASPIRIN 81 MG PO CHEW
81.0000 mg | CHEWABLE_TABLET | Freq: Every day | ORAL | Status: DC
  Administered 2011-09-25 – 2011-09-29 (×5): 81 mg via ORAL
  Filled 2011-09-25 (×5): qty 1

## 2011-09-25 MED ORDER — OMEPRAZOLE 20 MG PO CPDR
20.0000 mg | DELAYED_RELEASE_CAPSULE | Freq: Every day | ORAL | Status: DC
  Administered 2011-09-25 – 2011-09-29 (×5): 20 mg via ORAL
  Filled 2011-09-25 (×5): qty 1

## 2011-09-25 MED ORDER — WARFARIN SODIUM 2 MG PO TABS
2.0000 mg | ORAL_TABLET | Freq: Once | ORAL | Status: AC
  Administered 2011-09-25: 2 mg via ORAL
  Filled 2011-09-25: qty 1

## 2011-09-25 MED ORDER — WARFARIN SODIUM 1 MG PO TABS
1.0000 mg | ORAL_TABLET | Freq: Once | ORAL | Status: DC
Start: 2011-09-25 — End: 2011-09-25
  Filled 2011-09-25: qty 1

## 2011-09-25 MED ORDER — WARFARIN - PHARMACIST DOSING INPATIENT
Freq: Every day | Status: DC
  Filled 2011-09-25: qty 1

## 2011-09-25 NOTE — Progress Notes (Addendum)
Subjective: POD 1 left hip washout. Mild pain still.  Objective: Vital signs in last 24 hours: Temp:  [97.1 F (36.2 C)-98.8 F (37.1 C)] 98 F (36.7 C) (02/28 0800) Pulse Rate:  [66-82] 70  (02/28 0800) Resp:  [9-20] 18  (02/28 0800) BP: (111-155)/(53-79) 138/75 mmHg (02/28 1037) SpO2:  [94 %-100 %] 95 % (02/28 0946) Weight change:  Last BM Date: 09/23/11  Intake/Output from previous day: 02/27 0701 - 02/28 0700 In: 3037.5 [P.O.:240; I.V.:1922.5; Blood:775; IV Piggyback:100] Out: 1435 [Urine:1100; Drains:85; Blood:250] Total I/O In: 120 [P.O.:120] Out: 250 [Urine:250]   Physical Exam: General: Alert, awake, oriented x3, in no acute distress. HEENT: No bruits, no goiter. Heart: Regular rate and rhythm, without murmurs, rubs, gallops. Lungs: Clear to auscultation bilaterally. Abdomen: Soft, nontender, nondistended, positive bowel sounds. Extremities: No clubbing cyanosis or edema with positive pedal pulses. Hemovac and knee immobilizer in place. Neuro: Grossly intact, nonfocal.    Lab Results: Basic Metabolic Panel:  Basename 09/25/11 0453 09/23/11 0440  NA 138 136  K 3.6 3.4*  CL 100 98  CO2 30 26  GLUCOSE 88 88  BUN 8 8  CREATININE 0.50 0.50  CALCIUM 7.9* 7.9*  MG -- --  PHOS -- --   CBC:  Basename 09/25/11 0453 09/24/11 0812  WBC 5.3 5.1  NEUTROABS -- --  HGB 9.1* 7.7*  HCT 29.1* 24.4*  MCV 92.1 92.8  PLT 200 190   Coagulation:  Basename 09/25/11 0453 09/24/11 1721  LABPROT 15.8* 18.8*  INR 1.23 1.54*    Studies/Results: No results found.  Medications: Scheduled Meds:    . budesonide-formoterol  2 puff Inhalation BID  .  ceFAZolin (ANCEF) IV  1 g Intravenous Once  .  ceFAZolin (ANCEF) IV  1 g Intravenous Q6H  . docusate sodium  100 mg Oral BID  . enoxaparin  40 mg Subcutaneous Q24H  . metoprolol succinate  75 mg Oral Daily  . omeprazole  20 mg Oral Q1200  . oxybutynin  5 mg Oral Daily  . Tamsulosin HCl  0.4 mg Oral QHS  . warfarin   2 mg Oral Once  . zolpidem  10 mg Oral QHS  . DISCONTD: ferrous sulfate  325 mg Oral Daily  . DISCONTD: NON FORMULARY 20 mg  20 mg Oral Daily  . DISCONTD: pantoprazole  40 mg Oral Q1200  . DISCONTD: warfarin  1 mg Oral ONCE-1800  . DISCONTD: Warfarin - Pharmacist Dosing Inpatient   Does not apply q1800   Continuous Infusions:    . 0.9 % NaCl with KCl 20 mEq / L 75 mL/hr at 09/25/11 0348  . lactated ringers     PRN Meds:.albuterol, bisacodyl, HYDROcodone-acetaminophen, HYDROmorphone, HYDROmorphone (DILAUDID) injection, HYDROmorphone, ipratropium, meperidine, methocarbamol(ROBAXIN) IV, methocarbamol, metoCLOPramide (REGLAN) injection, metoCLOPramide, ondansetron (ZOFRAN) IV, ondansetron (ZOFRAN) IV, ondansetron, ondansetron, oxyCODONE-acetaminophen, polyethylene glycol, promethazine, sodium phosphate  Assessment/Plan:  Principal Problem:  *Anticoagulant long-term use Active Problems:  PULMONARY HYPERTENSION, SECONDARY  Atrial flutter  COPD UNSPECIFIED  Pulmonary embolism  Morbid obesity  Hematoma   #1 Left hip hematoma: Had washout of left hip yesterday.  #2 ABLA: Hb 9.1 after 2 units of PRBCs given yesterday.  #3 Long-term coumadin use: had PE in July 2012; for this reason alone should not need any further coumadin. However, he has had a h/o a flutter. Has been running NSR on telemetry in the hospital. For now, will need to be off coumadin for at least the next 2-3 weeks, but will need to see cardiology  again as an outpatient for decision on when to restart coumadin. Called his PCP, Dr. Lysbeth Galas, no h/o a fib, altho there is a mention of a flutter in 2010. Ortho started coumadin again today for DVT prophylaxis. Have discussed this with Avel Peace and will discontinue.  #4 COPD: Stable.   LOS: 3 days   [facility] Pager: [phone] 09/25/2011, 2:17 PM

## 2011-09-25 NOTE — Op Note (Signed)
NAMEJEDIAH, HORGER NO.:  [id number]  MEDICAL RECORD NO.:  [id number]  LOCATION:  1415                         FACILITY:  [facility]  PHYSICIAN:  Ollen Gross, M.D.    DATE OF BIRTH:  [birth year]  DATE OF PROCEDURE:  09/24/2011 DATE OF DISCHARGE:                              OPERATIVE REPORT   PREOPERATIVE DIAGNOSIS:  Large hematoma, left hip.  POSTOPERATIVE DIAGNOSIS:  Large hematoma, left hip.  PROCEDURE:  Irrigation and debridement, left hip with evacuation of hematoma.  SURGEON:  Ollen Gross, M.D.  ASSISTANT:  Dimitri Ped, PA  ANESTHESIA:  General.  ESTIMATED BLOOD LOSS:  100.  DRAINS:  Hemovac x1.  COMPLICATIONS:  None.  CONDITION:  Stable to Recovery.  BRIEF CLINICAL NOTE:  Royal is a 66 year old male who had a total hip arthroplasty done a few years ago.  Four days ago, he sustained a dislocation.  He had a closed reduction by Dr. Ranell Patrick in the operating room.  Discharged home and had to be readmitted the next day with severe pain in the hip and large hematoma.  He is on chronic Coumadin therapy and his INR had gotten over 3.5 with subsequent bleeding into the thigh and hematoma.  CT scan showed a large hematoma in the left thigh and hip area.  Reversed anticoagulation.  Reports to the operating room today for evacuation of hematoma and irrigation and debridement.  PROCEDURE IN DETAIL:  After successful administration of general anesthetic, patient was placed in the right lateral decubitus position, the left side up and held with the hip positioner.  Left lower extremity was isolated from perineum with plastic drapes and prepped and draped in the usual sterile fashion.  His old incisions were utilized, skin cut with 10 blade through subcutaneous tissue, the fascia lata which was incised in line with the skin incision.  Upon going through the fascia lata, large amount of hematoma was evacuated.  This was both solid and liquified  hematoma.  I was able to scoop out a large amount just from the posterolateral hip.  This tracked up between the gluteus maximus and medius.  I was able to manually remove the clot in the pocket that it had formed.  Distally, it did not track at all.  It did not track anteriorly either.  I then irrigated with 3 L of saline using pulsatile lavage.  The tissues all had a healthy appearance.  There were no signs of infection.  Once completed, then the fascia lata was closed over Hemovac drain with interrupted #1 Vicryl.  Subcu was closed in multiple layers with #1 and 2-0 Vicryl and skin closed with staples. Drains hooked to suction, incision cleaned and dried, and a bulky sterile dressing applied.  He was placed into a knee immobilizer, awakened, and transported to Recovery in stable condition.     Ollen Gross, M.D.     FA/MEDQ  D:  09/24/2011  T:  09/25/2011  Job:  [id number]

## 2011-09-25 NOTE — Progress Notes (Signed)
Pt returned to floor at 2245 from PACU.  2nd unit of blood was already transfusing.  Hourly vitals for blood documentation began at 2245.

## 2011-09-25 NOTE — Progress Notes (Signed)
ANTICOAGULATION CONSULT NOTE  Pharmacy Consult for Coumadin Indication: PE in July 2012/Atrial flutter  No Known Allergies  Patient Measurements: Height: 5' 7.5" (171.5 cm) Weight: 224 lb (101.606 kg) IBW/kg (Calculated) : 67.25   Vital Signs: Temp: 98 F (36.7 C) (02/28 0800) Temp src: Oral (02/28 0800) BP: 138/75 mmHg (02/28 1037) Pulse Rate: 70  (02/28 0800)  Labs:  Alvira Philips 09/25/11 0453 09/24/11 1721 09/24/11 0812 09/23/11 0917 09/23/11 0440 09/22/11 1655  HGB 9.1* -- 7.7* -- -- --  HCT 29.1* -- 24.4* 24.4* -- --  PLT 200 -- 190 -- 190 --  APTT -- -- -- -- -- --  LABPROT 15.8* 18.8* 25.2* -- -- --  INR 1.23 1.54* 2.24* -- -- --  HEPARINUNFRC -- -- -- -- -- --  CREATININE 0.50 -- -- -- 0.50 0.55  CKTOTAL -- -- -- -- -- --  CKMB -- -- -- -- -- --  TROPONINI -- -- -- -- -- --   Estimated Creatinine Clearance: 105.5 ml/min (by C-G formula based on Cr of 0.5).  Medical History: Past Medical History  Diagnosis Date  . CHF (congestive heart failure)   . COPD (chronic obstructive pulmonary disease)     Wert. PFTs 10/09/08 FEV1 1.39 (44%) ratio 40, DLCO 63%. 16% improvement after bronchodilator. overnight pulse ox 02/28/09 5:10m pent with sat< 89 sleeping > declined further o2 07/05/09. HFA 75% 04/23/09.   [name] Atrial flutter     echo 09/17/08 nl EF, mild LAE, RV mod dilated, pasystolic 46. noct desat 02/28/09, refused 02 @ hs   . Abnormal CT scan     SPN right mid zone. first detected 08/16/08; see [facility]   . Weight gain     target weight 196; BMI <30  . Lung nodule   . Hypertension     Assessment: 65YOM admitted with left hip hematoma s/p I&D, now restarting home coumadin for h/o DVT and aflutter.  Home coumadin regimen: 1.25 mg daily except take 2.5 mg on Tuesdays.  Patient received 2 mg dose early this AM.  INR subtherapeutic and may be slightly resistant to coumadin doses d/t Vitamin K given for reversal.  CBC ok.  No bleeding/complications reported.   Goal of Therapy:    INR 2-3   Plan:  Coumadin 1 mg po once tonight. Follow up INR in AM.  Clance Boll 09/25/2011,11:25 AM

## 2011-09-25 NOTE — Progress Notes (Signed)
Patient c/o severe pain with knee immobilizer on . Dr Constance Goltz on call notified , orders received to take immobilizer off while in bed. Will continue to monitor.

## 2011-09-25 NOTE — Progress Notes (Signed)
Subjective: 1 Day Post-Op Procedure(s) (LRB): IRRIGATION AND DEBRIDEMENT HIP (Left) Patient reports pain as mild and moderate.   Patient has complaints of soreness and tightness in thigh and hip but it is improved from yesterday. We will start therapy today. Plan is to go home after hospital stay.  Objective: Vital signs in last 24 hours: Temp:  [97.1 F (36.2 C)-98.8 F (37.1 C)] 98 F (36.7 C) (02/28 0800) Pulse Rate:  [66-82] 70  (02/28 0800) Resp:  [9-20] 18  (02/28 0800) BP: (100-155)/(53-79) 138/75 mmHg (02/28 1037) SpO2:  [94 %-100 %] 95 % (02/28 0946)  Intake/Output from previous day:  Intake/Output Summary (Last 24 hours) at 09/25/11 1114 Last data filed at 09/25/11 1000  Gross per 24 hour  Intake 3157.5 ml  Output   1435 ml  Net 1722.5 ml    Intake/Output this shift: Total I/O In: 120 [P.O.:120] Out: 100 [Urine:100]  Labs:  Basename 09/25/11 0453 09/24/11 0812 09/23/11 0917 09/23/11 0440 09/22/11 1655  HGB 9.1* 7.7* 8.0* 7.9* 8.9*    Basename 09/25/11 0453 09/24/11 0812  WBC 5.3 5.1  RBC 3.16* 2.63*  HCT 29.1* 24.4*  PLT 200 190    Basename 09/25/11 0453 09/23/11 0440  NA 138 136  K 3.6 3.4*  CL 100 98  CO2 30 26  BUN 8 8  CREATININE 0.50 0.50  GLUCOSE 88 88  CALCIUM 7.9* 7.9*    Basename 09/25/11 0453 09/24/11 1721  LABPT -- --  INR 1.23 1.54*    Exam - Neurovascular intact Sensation intact distally Compartment soft in soft as compared to yesterday preoperatively. Dressing - clean, dry, no drainage Motor function intact - moving foot and toes well on exam.  Hemovac patent and left in place to assist with further decompression of hip.   Past Medical History  Diagnosis Date  . CHF (congestive heart failure)   . COPD (chronic obstructive pulmonary disease)     Wert. PFTs 10/09/08 FEV1 1.39 (44%) ratio 40, DLCO 63%. 16% improvement after bronchodilator. overnight pulse ox 02/28/09 5:56m pent with sat< 89 sleeping > declined further o2  07/05/09. HFA 75% 04/23/09.   [name] Atrial flutter     echo 09/17/08 nl EF, mild LAE, RV mod dilated, pasystolic 46. noct desat 02/28/09, refused 02 @ hs   . Abnormal CT scan     SPN right mid zone. first detected 08/16/08; see [facility]   . Weight gain     target weight 196; BMI <30  . Lung nodule   . Hypertension     Assessment/Plan: 1 Day Post-Op Procedure(s) (LRB): IRRIGATION AND DEBRIDEMENT HIP (Left) Principal Problem:  *Anticoagulant long-term use Active Problems:  PULMONARY HYPERTENSION, SECONDARY  Atrial flutter  COPD UNSPECIFIED  Pulmonary embolism  Morbid obesity  Hematoma   Advance diet Up with therapy Discharge home with home health  DVT Prophylaxis - Coumadin Protocol WBAT to Left leg Knee Immobilizer when in bed but may have off for ambulation Hemovac still in place. Begin Therapy Hip Preacutions   Erandi Lemma 09/25/2011, 11:14 AM

## 2011-09-26 ENCOUNTER — Encounter (HOSPITAL_COMMUNITY): Payer: Self-pay | Admitting: Orthopedic Surgery

## 2011-09-26 LAB — GLUCOSE, CAPILLARY: Glucose-Capillary: 113 mg/dL — ABNORMAL HIGH (ref 70–99)

## 2011-09-26 LAB — PROTIME-INR: Prothrombin Time: 15.2 seconds (ref 11.6–15.2)

## 2011-09-26 NOTE — Progress Notes (Signed)
Subjective: POD 2 left hip washout. Mild pain still.  Objective: Vital signs in last 24 hours: Temp:  [97.9 F (36.6 C)-98.9 F (37.2 C)] 98.4 F (36.9 C) (03/01 1425) Pulse Rate:  [64-94] 67  (03/01 1425) Resp:  [16-20] 18  (03/01 1425) BP: (104-141)/(61-72) 104/61 mmHg (03/01 1425) SpO2:  [92 %-98 %] 98 % (03/01 1425) Weight change:  Last BM Date: 09/23/11  Intake/Output from previous day: 02/28 0701 - 03/01 0700 In: 1500 [P.O.:600; I.V.:900] Out: 1070 [Urine:1050; Drains:20] Total I/O In: 240 [P.O.:240] Out: 425 [Urine:425]   Physical Exam: General: Alert, awake, oriented x3, in no acute distress. HEENT: No bruits, no goiter. Heart: Regular rate and rhythm, without murmurs, rubs, gallops. Lungs: Clear to auscultation bilaterally. Abdomen: Soft, nontender, nondistended, positive bowel sounds. Extremities: No clubbing cyanosis or edema with positive pedal pulses. Hemovac and knee immobilizer in place. Neuro: Grossly intact, nonfocal.    Lab Results: Basic Metabolic Panel:  [facility] 09/25/11 0453  NA 138  K 3.6  CL 100  CO2 30  GLUCOSE 88  BUN 8  CREATININE 0.50  CALCIUM 7.9*  MG --  PHOS --   CBC:  Basename 09/25/11 0453 09/24/11 0812  WBC 5.3 5.1  NEUTROABS -- --  HGB 9.1* 7.7*  HCT 29.1* 24.4*  MCV 92.1 92.8  PLT 200 190   Coagulation:  Basename 09/26/11 0430 09/25/11 0453  LABPROT 15.2 15.8*  INR 1.18 1.23    Studies/Results: No results found.  Medications: Scheduled Meds:    . aspirin  81 mg Oral Daily  . budesonide-formoterol  2 puff Inhalation BID  . docusate sodium  100 mg Oral BID  . metoprolol succinate  75 mg Oral Daily  . omeprazole  20 mg Oral Q1200  . oxybutynin  5 mg Oral Daily  . Tamsulosin HCl  0.4 mg Oral QHS  . zolpidem  10 mg Oral QHS   Continuous Infusions:    . 0.9 % NaCl with KCl 20 mEq / L 75 mL/hr (09/26/11 0942)  . lactated ringers     PRN Meds:.albuterol, bisacodyl, HYDROcodone-acetaminophen,  HYDROmorphone, HYDROmorphone (DILAUDID) injection, HYDROmorphone, ipratropium, meperidine, methocarbamol(ROBAXIN) IV, methocarbamol, metoCLOPramide (REGLAN) injection, metoCLOPramide, ondansetron (ZOFRAN) IV, ondansetron (ZOFRAN) IV, ondansetron, ondansetron, oxyCODONE-acetaminophen, polyethylene glycol, promethazine  Assessment/Plan:  Principal Problem:  *Anticoagulant long-term use Active Problems:  PULMONARY HYPERTENSION, SECONDARY  Atrial flutter  COPD UNSPECIFIED  Pulmonary embolism  Morbid obesity  Hematoma   #1 Left hip hematoma: Had washout of left hip.Wound care as per ortho.  #2 ABLA: Received 2 units of PRBCs prior to surgery.  #3 Long-term coumadin use: had PE in July 2012; for this reason alone should not need any further coumadin. However, he has had a h/o a flutter. Has been running NSR on telemetry in the hospital. For now, will need to be off coumadin for at least the next 2-3 weeks, but will need to see cardiology again as an outpatient for decision on when to restart coumadin. Called his PCP, Dr. Lysbeth Galas, no h/o a fib, altho there is a mention of a flutter in 2010.   #4 COPD: Stable.  #5 Dispo: PT is recommending SNF. SW aware.   LOS: 4 days   [facility] Pager: [phone] 09/26/2011, 2:41 PM

## 2011-09-26 NOTE — Evaluation (Signed)
Physical Therapy Evaluation Patient Details Name: Francisco Tanner MRN: [id number] DOB: [birth year] Today's Date: 09/26/2011  Problem List:  Patient Active Problem List  Diagnoses  . PULMONARY HYPERTENSION, SECONDARY  . Atrial flutter  . PVD  . COPD UNSPECIFIED  . PULMONARY NODULE  . Pulmonary embolism  . Diastolic heart failure  . Morbid obesity  . Hematoma  . Anticoagulant long-term use    Past Medical History:  Past Medical History  Diagnosis Date  . CHF (congestive heart failure)   . COPD (chronic obstructive pulmonary disease)     Wert. PFTs 10/09/08 FEV1 1.39 (44%) ratio 40, DLCO 63%. 16% improvement after bronchodilator. overnight pulse ox 02/28/09 5:24m pent with sat< 89 sleeping > declined further o2 07/05/09. HFA 75% 04/23/09.   [name] Atrial flutter     echo 09/17/08 nl EF, mild LAE, RV mod dilated, pasystolic 46. noct desat 02/28/09, refused 02 @ hs   . Abnormal CT scan     SPN right mid zone. first detected 08/16/08; see [facility]   . Weight gain     target weight 196; BMI <30  . Lung nodule   . Hypertension    Past Surgical History:  Past Surgical History  Procedure Date  . Hip replacement surgery     L   . Kyphosis surgery   . Back surgery     x4  . Knee surgery   . Cardioversion     in past    PT Assessment/Plan/Recommendation PT Assessment Clinical Impression Statement: Pt s/p irrigation and debridement L hip. Pt would benefit from acute PT services in order to improve mobility and strength to prepare for d/c to brother's home vs SNF.  Pt hopeful to d/c to brother's home however, unable to transfer to chair with one person assist upon eval 2* weakness and pain.  Pt re-educated in posterior hip precautions and reviewed ankle pumps, hip abduction, heel slides within precautions, quad sets, and glut sets. PT Recommendation/Assessment: Patient will need skilled PT in the acute care venue PT Problem List: Decreased strength;Decreased activity tolerance;Decreased  mobility;Decreased safety awareness;Decreased knowledge of precautions;Decreased knowledge of use of DME;Pain PT Therapy Diagnosis : Difficulty walking;Acute pain PT Plan PT Frequency: 7X/week PT Treatment/Interventions: DME instruction;Gait training;Stair training;Functional mobility training;Therapeutic activities;Therapeutic exercise;Patient/family education PT Recommendation Follow Up Recommendations: Skilled nursing facility (vs. home with brother to assist depending on progress) Equipment Recommended: Other (comment) (if home wheels for his standard walker) PT Goals  Acute Rehab PT Goals PT Goal Formulation: With patient Time For Goal Achievement: 7 days Pt will go Supine/Side to Sit: with modified independence PT Goal: Supine/Side to Sit - Progress: Goal set today Pt will go Sit to Supine/Side: with modified independence PT Goal: Sit to Supine/Side - Progress: Goal set today Pt will go Sit to Stand: with supervision PT Goal: Sit to Stand - Progress: Goal set today Pt will go Stand to Sit: with supervision PT Goal: Stand to Sit - Progress: Goal set today Pt will Ambulate: 51 - 150 feet;with supervision;with least restrictive assistive device PT Goal: Ambulate - Progress: Goal set today Pt will Go Up / Down Stairs: 3-5 stairs;with least restrictive assistive device;with min assist PT Goal: Up/Down Stairs - Progress: Goal set today Pt will Perform Home Exercise Program: with supervision, verbal cues required/provided PT Goal: Perform Home Exercise Program - Progress: Goal set today  PT Evaluation Precautions/Restrictions  Precautions Precautions: Posterior Hip;Fall Required Braces or Orthoses: Yes Knee Immobilizer: Other (comment) (on when in bed, can remove  for ambulation) Restrictions Weight Bearing Restrictions: No LLE Weight Bearing: Weight bearing as tolerated Prior Functioning  Home Living Lives With: Alone Type of Home: House Home Layout: One level Home Access:  Stairs to enter Entrance Stairs-Rails: None Entrance Stairs-Number of Steps: 3 Home Adaptive Equipment: Tub transfer bench;Straight cane;Walker - standard;Bedside commode/3-in-1 Additional Comments: Pt reports BSC is most likely too small for him.  Pt hopes to d/c home with his brother layout above. Prior Function Level of Independence: Requires assistive device for independence;Independent with basic ADLs Comments: Pt used straight cane. Cognition Cognition Arousal/Alertness: Awake/alert Overall Cognitive Status: Appears within functional limits for tasks assessed Orientation Level: Oriented X4 Sensation/Coordination   Extremity Assessment RLE Assessment RLE Assessment: Within Functional Limits LLE Strength LLE Overall Strength Comments: grossly at least 3/5 within precautions, did not apply resistance 2* increased hip pain Mobility (including Balance) Bed Mobility Bed Mobility: Yes Supine to Sit: 3: Mod assist;With rails Supine to Sit Details (indicate cue type and reason): assist for L LE and a little for trunk Sitting - Scoot to Edge of Bed: With rail;4: Min assist Sitting - Scoot to [place] of Bed Details (indicate cue type and reason): increased time, verbal cues for technique Sit to Supine: 3: Mod assist Sit to Supine - Details (indicate cue type and reason): assist for LEs onto bed Transfers Transfers: Yes Sit to Stand: 1: +1 Total assist Sit to Stand Details (indicate cue type and reason): attempted however felt unsafe to perform with only one person assist, pt unable to clear bottom from bed    Exercise    End of Session PT - End of Session Equipment Utilized During Treatment: Gait belt Activity Tolerance: Patient limited by fatigue;Patient limited by pain Patient left: in bed;with call bell in reach General Behavior During Session: [facility] for tasks performed Cognition: [facility] for tasks performed  Amara Justen,KATHrine E 09/26/2011, 12:40 PM Pager: [phone]

## 2011-09-26 NOTE — Progress Notes (Signed)
Patient refuses to wear continuous pulse ox during the night. Sat check done throughout the night.

## 2011-09-26 NOTE — Progress Notes (Signed)
CSW met with patient re: discharge planning. He states he had been to [facility] in the past but would like to see what other bed offers he could get. FL2 completed and faxed out to [facility]. CSW will follow-up Monday with bed offers.   Unice Bailey, LCSWA [phone]

## 2011-09-26 NOTE — Progress Notes (Signed)
Physical Therapy Treatment Patient Details Name: Francisco Tanner MRN: [id number] DOB: Feb 13, 1946 Today's Date: 09/26/2011  PT Assessment/Plan  PT - Assessment/Plan Comments on Treatment Session: Pt did much better with afternoon session and able to ambulate in hallway.  Pt reports he isn't sure if his brother will let him stay with him but hopes to d/c home, not SNF.  May progress to home if stays over weekend (as pt reports possible d/c monday). PT Plan: Discharge plan remains appropriate;Frequency remains appropriate PT Frequency: 7X/week Follow Up Recommendations: Home health PT;Skilled nursing facility;Supervision for mobility/OOB (depending on progress) Equipment Recommended: Other (comment) (if home wheels for his standard walker) PT Goals  Acute Rehab PT Goals PT Goal Formulation: With patient Time For Goal Achievement: 7 days Pt will go Supine/Side to Sit: with modified independence PT Goal: Supine/Side to Sit - Progress: Progressing toward goal Pt will go Sit to Supine/Side: with modified independence PT Goal: Sit to Supine/Side - Progress: Goal set today Pt will go Sit to Stand: with supervision PT Goal: Sit to Stand - Progress: Progressing toward goal Pt will go Stand to Sit: with supervision PT Goal: Stand to Sit - Progress: Progressing toward goal Pt will Ambulate: 51 - 150 feet;with supervision;with least restrictive assistive device PT Goal: Ambulate - Progress: Progressing toward goal Pt will Go Up / Down Stairs: 3-5 stairs;with least restrictive assistive device;with min assist PT Goal: Up/Down Stairs - Progress: Goal set today Pt will Perform Home Exercise Program: with supervision, verbal cues required/provided PT Goal: Perform Home Exercise Program - Progress: Progressing toward goal  PT Treatment Precautions/Restrictions  Precautions Precautions: Posterior Hip;Fall Required Braces or Orthoses: Yes Knee Immobilizer: Other (comment) (on when in bed, can remove for  ambulation) Restrictions Weight Bearing Restrictions: No LLE Weight Bearing: Weight bearing as tolerated Mobility (including Balance) Bed Mobility Bed Mobility: Yes Supine to Sit: 5: Supervision;HOB elevated (Comment degrees);With rails Supine to Sit Details (indicate cue type and reason): increased time, HOB elevated, use of rail Sitting - Scoot to Edge of Bed: With rail;4: Min assist Sitting - Scoot to Edge of Bed Details (indicate cue type and reason): increased time, verbal cues for technique Sit to Supine: 3: Mod assist Sit to Supine - Details (indicate cue type and reason): assist for LEs onto bed Transfers Transfers: Yes Sit to Stand: 4: Min assist;From elevated surface;From bed;With upper extremity assist Sit to Stand Details (indicate cue type and reason): verbal cues for L LE forward and hand placement Stand to Sit: 4: Min assist;With armrests;With upper extremity assist;To chair/3-in-1 Stand to Sit Details: min/guard, verbal cues for L LE forward and hand placement Ambulation/Gait Ambulation/Gait: Yes Ambulation/Gait Assistance: 4: Min assist Ambulation/Gait Assistance Details (indicate cue type and reason): min/guard, verbal cues for sequence, verbal cue for hip precautions when turning to L Ambulation Distance (Feet): 120 Feet Assistive device: Rolling walker Gait Pattern: Step-to pattern;Trunk flexed    Exercise  Total Joint Exercises Gluteal Sets: AROM;Both;10 reps;Supine Short Arc Quad: AROM;Strengthening;Left;10 reps;Supine Heel Slides: AAROM;Strengthening;Left;10 reps;Supine Hip ABduction/ADduction: AAROM;Strengthening;Left;10 reps;Supine Long Arc Quad: AROM;Strengthening;Both;20 reps;Seated End of Session PT - End of Session Equipment Utilized During Treatment: Gait belt Activity Tolerance: Patient tolerated treatment well Patient left: in chair;with call bell in reach;with family/visitor present Nurse Communication: Mobility status for  transfers General Behavior During Session: [facility] for tasks performed Cognition: [facility] for tasks performed  Sarahlynn Cisnero,KATHrine E 09/26/2011, 3:58 PM Pager: [phone]

## 2011-09-26 NOTE — Progress Notes (Signed)
Subjective: 2 Days Post-Op Procedure(s) (LRB): IRRIGATION AND DEBRIDEMENT HIP (Left) Patient reports pain as mild.  Patient has complaints of soreness and pain but some better today.  Hemovac output has all but stopped.  Had pain with the Knee Immobilizer last night. Instructed to use pillow between legs when in bed.  Patient was on Coumadin preop.  From an ortho standpoint, as long as he is walking and progressing, he could do a short course of coumadin or even do aspirin.  The need for further coumadin to be determined by Medicine and his PE history.  Physical Therapy was ordered yesterday but the patient was not seen.  Will reorder again today.  He NEEDS PT to assist with transfer, education since his dislocation, gait training, etc. since he lives alone.  Home when medically stable.  Objective: Vital signs in last 24 hours: Temp:  [97.9 F (36.6 C)-98.9 F (37.2 C)] 98 F (36.7 C) (03/01 0600) Pulse Rate:  [64-94] 80  (03/01 0600) Resp:  [16-20] 20  (03/01 0600) BP: (111-138)/(63-75) 115/64 mmHg (03/01 0600) SpO2:  [92 %-97 %] 93 % (03/01 0600)  Intake/Output from previous day:  Intake/Output Summary (Last 24 hours) at 09/26/11 0912 Last data filed at 09/26/11 0800  Gross per 24 hour  Intake   1500 ml  Output   1320 ml  Net    180 ml    Intake/Output this shift: Total I/O In: -  Out: 250 [Urine:250]  Labs:  [facility] 09/25/11 0453 09/24/11 0812 09/23/11 0917  HGB 9.1* 7.7* 8.0*    Basename 09/25/11 0453 09/24/11 0812  WBC 5.3 5.1  RBC 3.16* 2.63*  HCT 29.1* 24.4*  PLT 200 190    Basename 09/25/11 0453  NA 138  K 3.6  CL 100  CO2 30  BUN 8  CREATININE 0.50  GLUCOSE 88  CALCIUM 7.9*    Basename 09/26/11 0430 09/25/11 0453  LABPT -- --  INR 1.18 1.23    Exam - Neurovascular intact Sensation intact distally Dressing/Incision - clean, some serous bloody drainage on incision, blood tinged serous drainage from HV site.  Hemovac removed without difficulty  (drainage had essentially stopped with HV) Motor function intact - moving foot and toes well on exam.   Past Medical History  Diagnosis Date  . CHF (congestive heart failure)   . COPD (chronic obstructive pulmonary disease)     Wert. PFTs 10/09/08 FEV1 1.39 (44%) ratio 40, DLCO 63%. 16% improvement after bronchodilator. overnight pulse ox 02/28/09 5:48m pent with sat< 89 sleeping > declined further o2 07/05/09. HFA 75% 04/23/09.   [name] Atrial flutter     echo 09/17/08 nl EF, mild LAE, RV mod dilated, pasystolic 46. noct desat 02/28/09, refused 02 @ hs   . Abnormal CT scan     SPN right mid zone. first detected 08/16/08; see [facility]   . Weight gain     target weight 196; BMI <30  . Lung nodule   . Hypertension     Assessment/Plan: 2 Days Post-Op Procedure(s) (LRB): IRRIGATION AND DEBRIDEMENT HIP (Left) Principal Problem:  *Anticoagulant long-term use Active Problems:  PULMONARY HYPERTENSION, SECONDARY  Atrial flutter  COPD UNSPECIFIED  Pulmonary embolism  Morbid obesity  Hematoma   Advance diet Up with therapy Discharge home with home health  Weight Bearing As Tolerated Left Leg Knee Immobilizer when in bed or may have pillows between knees when in bed but may have off for ambulation.,  KI during the day when not ambulating.  Corrado Hymon 09/26/2011, 9:12 AM

## 2011-09-27 ENCOUNTER — Inpatient Hospital Stay (HOSPITAL_COMMUNITY): Payer: Medicare Other

## 2011-09-27 LAB — CBC
MCH: 29.1 pg (ref 26.0–34.0)
MCHC: 31.4 g/dL (ref 30.0–36.0)
Platelets: 204 10*3/uL (ref 150–400)

## 2011-09-27 LAB — PROTIME-INR: Prothrombin Time: 14.9 seconds (ref 11.6–15.2)

## 2011-09-27 MED ORDER — FUROSEMIDE 10 MG/ML IJ SOLN
60.0000 mg | Freq: Once | INTRAMUSCULAR | Status: AC
  Administered 2011-09-27: 60 mg via INTRAVENOUS
  Filled 2011-09-27: qty 6

## 2011-09-27 NOTE — Progress Notes (Signed)
Pt up with PT to ambulate in hallway, and pt desired RA trial as he wants to go home soon. Short distance into hallway, aprox 30 ft, pt desaturated to 76%. Placed on portable O2 at 3LPM and returned to his room. Up in chair without complaints. Denies chest pain.  Pt's O2 was increased to 4L during the night per nurse report. This am upon assessment pt using IS and saturations 100% so writer decreased O2 back to 2LPM, saturations have remained mid-high 90's. Will call MD to make aware.

## 2011-09-27 NOTE — Progress Notes (Signed)
Dr. Ardyth Harps present to assess pt, stat port chest xray ordered. Pt up in chair with no complaints.

## 2011-09-27 NOTE — Progress Notes (Signed)
Physical Therapy Treatment Patient Details Name: Francisco Tanner MRN: [id number] DOB: 07-06-1946 Today's Date: 09/27/2011  PT Assessment/Plan  PT - Assessment/Plan Comments on Treatment Session: Pt had CXR showing some increased fluid, RN giving Lasix this pm; exertional sats better this pm on 2-3L O2 throughout; HR 124 after amb PT Plan: Discharge plan remains appropriate;Frequency remains appropriate PT Frequency: 7X/week Follow Up Recommendations: Home health PT Equipment Recommended:  (wheels for his standard walker) PT Goals  Acute Rehab PT Goals Pt will go Supine/Side to Sit: with modified independence PT Goal: Supine/Side to Sit - Progress: Progressing toward goal Pt will go Sit to Supine/Side: with modified independence PT Goal: Sit to Supine/Side - Progress: Progressing toward goal Pt will go Sit to Stand: with supervision PT Goal: Sit to Stand - Progress: Progressing toward goal Pt will go Stand to Sit: with supervision PT Goal: Stand to Sit - Progress: Progressing toward goal Pt will Ambulate: 51 - 150 feet;with supervision;with least restrictive assistive device PT Goal: Ambulate - Progress: Progressing toward goal  PT Treatment Precautions/Restrictions  Precautions Precautions: Posterior Hip;Fall Required Braces or Orthoses: Yes Knee Immobilizer: Other (comment) (on when in bed, can remove for ambulation) Restrictions Weight Bearing Restrictions: No LLE Weight Bearing: Weight bearing as tolerated Mobility (including Balance) Bed Mobility Supine to Sit: 5: Supervision;HOB flat Supine to Sit Details (indicate cue type and reason): cues to avoid use of rails Sitting - Scoot to Edge of Bed: 5: Supervision Sitting - Scoot to Edge of Bed Details (indicate cue type and reason): increased time, verbal cues for technique Sit to Supine: 5: Supervision Sit to Supine - Details (indicate cue type and reason): cues for task completion and THP/technique Transfers Sit to Stand: 5:  Supervision;From bed;With upper extremity assist Sit to Stand Details (indicate cue type and reason): verbal cues for L LE forward and hand placement Stand to Sit: 5: Supervision;4: Min assist;To bed;With upper extremity assist Stand to Sit Details: min/guard, verbal cues for L LE forward and hand placement Ambulation/Gait Ambulation/Gait Assistance: 5: Supervision;4: Min assist Ambulation/Gait Assistance Details (indicate cue type and reason): min/guard, verbal cues for sequence, verbal cue for hip precautions step into RW Ambulation Distance (Feet): 150 Feet Assistive device: Rolling walker Gait Pattern: Step-to pattern    Exercise  Total Joint Exercises Heel Slides: Strengthening;Left;10 reps;Supine;AROM General Exercises - Lower Extremity Ankle Circles/Pumps: AROM;Both;15 reps End of Session PT - End of Session Equipment Utilized During Treatment: Gait belt Activity Tolerance: Patient tolerated treatment well Patient left: in bed;with call bell in reach;with family/visitor present Nurse Communication: Mobility status for transfers;Mobility status for ambulation General Behavior During Session: [facility] for tasks performed Cognition: [facility] for tasks performed  [facility] 09/27/2011, 3:54 PM

## 2011-09-27 NOTE — Progress Notes (Signed)
Subjective: 3 Days Post-Op Procedure(s) (LRB): IRRIGATION AND DEBRIDEMENT HIP (Left)   Patient reports pain as mild. No events.  Objective:   VITALS:   Filed Vitals:   09/27/11 0622  BP: 129/75  Pulse: 61  Temp: 97.8 F (36.6 C)  Resp: 18    Neurovascular intact Dorsiflexion/Plantar flexion intact Incision: scant drainage No cellulitis present Compartment soft  LABS  Basename 09/27/11 0504 09/25/11 0453  HGB 8.1* 9.1*  HCT 25.8* 29.1*  WBC 3.9* 5.3  PLT 204 200     Basename 09/25/11 0453  NA 138  K 3.6  BUN 8  CREATININE 0.50  GLUCOSE 88     Assessment/Plan: 3 Days Post-Op Procedure(s) (LRB): IRRIGATION AND DEBRIDEMENT HIP (Left)  Continue with medicine plan Continue with PT Wound to be inspected again on Sunday Weight Bearing As Tolerated Left Leg  Knee Immobilizer when in bed or may have pillows between knees when in bed but may have off for ambulation.  Anastasio Auerbach Skii Cleland   PAC  09/27/2011, 9:41 AM

## 2011-09-27 NOTE — Progress Notes (Signed)
Subjective: POD 3 left hip washout. Mild pain still. Was noted to be hypoxemic with ambulation today down to the mid 70s. Does not feel SOB. Has incentive spirometer at bedside.  Objective: Vital signs in last 24 hours: Temp:  [97.8 F (36.6 C)-98.7 F (37.1 C)] 98.7 F (37.1 C) (03/02 1319) Pulse Rate:  [61-69] 69  (03/02 1319) Resp:  [18] 18  (03/02 1319) BP: (104-129)/(61-75) 124/72 mmHg (03/02 1319) SpO2:  [96 %-99 %] 97 % (03/02 1319) Weight change:  Last BM Date: 09/27/11  Intake/Output from previous day: 03/01 0701 - 03/02 0700 In: 600 [P.O.:600] Out: 1376 [Urine:1375; Stool:1] Total I/O In: 1815 [I.V.:1815] Out: 200 [Urine:200]   Physical Exam: General: Alert, awake, oriented x3, in no acute distress. HEENT: No bruits, no goiter. Heart: Regular rate and rhythm, without murmurs, rubs, gallops. Lungs: Mild bibasilar crackles. Abdomen: Soft, nontender, nondistended, positive bowel sounds. Extremities: 3 + edema left leg, 1+ edema right leg. Neuro: Grossly intact, nonfocal.    Lab Results: Basic Metabolic Panel:  [facility] 09/25/11 0453  NA 138  K 3.6  CL 100  CO2 30  GLUCOSE 88  BUN 8  CREATININE 0.50  CALCIUM 7.9*  MG --  PHOS --   CBC:  Basename 09/27/11 0504 09/25/11 0453  WBC 3.9* 5.3  NEUTROABS -- --  HGB 8.1* 9.1*  HCT 25.8* 29.1*  MCV 92.8 92.1  PLT 204 200   Coagulation:  Basename 09/27/11 0504 09/26/11 0430  LABPROT 14.9 15.2  INR 1.15 1.18    Studies/Results: No results found.  Medications: Scheduled Meds:    . aspirin  81 mg Oral Daily  . budesonide-formoterol  2 puff Inhalation BID  . docusate sodium  100 mg Oral BID  . metoprolol succinate  75 mg Oral Daily  . omeprazole  20 mg Oral Q1200  . oxybutynin  5 mg Oral Daily  . Tamsulosin HCl  0.4 mg Oral QHS  . zolpidem  10 mg Oral QHS   Continuous Infusions:    . 0.9 % NaCl with KCl 20 mEq / L 75 mL/hr at 09/27/11 0047  . lactated ringers     PRN Meds:.albuterol,  bisacodyl, HYDROcodone-acetaminophen, HYDROmorphone, HYDROmorphone (DILAUDID) injection, HYDROmorphone, ipratropium, meperidine, methocarbamol(ROBAXIN) IV, methocarbamol, metoCLOPramide (REGLAN) injection, metoCLOPramide, ondansetron (ZOFRAN) IV, ondansetron (ZOFRAN) IV, ondansetron, ondansetron, oxyCODONE-acetaminophen, polyethylene glycol, promethazine  Assessment/Plan:  Principal Problem:  *Anticoagulant long-term use Active Problems:  PULMONARY HYPERTENSION, SECONDARY  Atrial flutter  COPD UNSPECIFIED  Pulmonary embolism  Morbid obesity  Hematoma   #1 Left hip hematoma: Had washout of left hip.Wound care as per ortho.  #2 ABLA: Received 2 units of PRBCs prior to surgery. Hb drifitng down to 8.1. Will recheck Hb in am.  #3 Long-term coumadin use: had PE in July 2012; for this reason alone should not need any further coumadin. However, he has had a h/o a flutter. Has been running NSR on telemetry in the hospital. For now, will need to be off coumadin for at least the next 2-3 weeks, but will need to see cardiology again as an outpatient for decision on when to restart coumadin. Called his PCP, Dr. Lysbeth Galas, no h/o a fib, altho there is a mention of a flutter in 2010. Dr. Lysbeth Galas agrees with plan to DC coumadin for now until followup postdischarge with cardiology.  #4 COPD: Stable.  #5 Hypoxemia: Some mild crackles on exam. NSL IVF. Check stat PCXR. DDX includes: volume overload, PNA, atelectasis, possible VTE event. If CXR negative, will doppler  left leg.  #5 Dispo: PT is recommending SNF. SW aware.   LOS: 5 days   [facility] Pager: [phone] 09/27/2011, 1:26 PM

## 2011-09-27 NOTE — Progress Notes (Signed)
Physical Therapy Treatment Patient Details Name: Francisco Tanner MRN: [id number] DOB: 1946/06/22 Today's Date: 09/27/2011  PT Assessment/Plan  PT - Assessment/Plan Comments on Treatment Session: Pt de-saturated to 77% on RA during amb.  Had pt sit in chair and rest and cued for pursed lip breathing;  O2 replaced,  and sats returned to 99% on 2-4L; Pt amb back to room and Sats >92 % on 2L; pt cont to desire to D/C home and requests PT to return PT Plan: Discharge plan remains appropriate;Frequency remains appropriate Follow Up Recommendations: Home health PT Equipment Recommended:  (wheels for his standard walker) PT Goals  Acute Rehab PT Goals Pt will go Supine/Side to Sit: with modified independence PT Goal: Supine/Side to Sit - Progress: Progressing toward goal Pt will go Sit to Stand: with supervision PT Goal: Sit to Stand - Progress: Progressing toward goal Pt will go Stand to Sit: with supervision PT Goal: Stand to Sit - Progress: Progressing toward goal Pt will Ambulate: 51 - 150 feet;with supervision;with least restrictive assistive device PT Goal: Ambulate - Progress: Progressing toward goal  PT Treatment Precautions/Restrictions  Precautions Precautions: Posterior Hip;Fall Required Braces or Orthoses: Yes Knee Immobilizer: Other (comment) (on when in bed, can remove for ambulation) Restrictions Weight Bearing Restrictions: No LLE Weight Bearing: Weight bearing as tolerated Mobility (including Balance) Bed Mobility Supine to Sit: 5: Supervision;HOB elevated (Comment degrees);With rails Supine to Sit Details (indicate cue type and reason): increased time, HOB elevated, use of rail Sitting - Scoot to Edge of Bed: With rail;4: Min assist Sitting - Scoot to Edge of Bed Details (indicate cue type and reason): increased time, verbal cues for technique Transfers Sit to Stand: 4: Min assist;From bed;With upper extremity assist Sit to Stand Details (indicate cue type and reason):  verbal cues for L LE forward and hand placement Stand to Sit Details: min/guard, verbal cues for L LE forward and hand placement Ambulation/Gait Ambulation/Gait Assistance: 4: Min assist Ambulation/Gait Assistance Details (indicate cue type and reason): min/guard, verbal cues for sequence, verbal cue for hip precautions  Ambulation Distance (Feet):  (60' x 2) Assistive device: Rolling walker Gait Pattern: Step-to pattern    Exercise    End of Session PT - End of Session Equipment Utilized During Treatment: Gait belt Activity Tolerance: Patient tolerated treatment well Patient left: in chair;with call bell in reach Nurse Communication: Mobility status for transfers General Behavior During Session: [facility] for tasks performed Cognition: [facility] for tasks performed  [facility] 09/27/2011, 1:25 PM

## 2011-09-28 DIAGNOSIS — I519 Heart disease, unspecified: Secondary | ICD-10-CM

## 2011-09-28 LAB — BASIC METABOLIC PANEL
BUN: 6 mg/dL (ref 6–23)
Calcium: 8.5 mg/dL (ref 8.4–10.5)
Creatinine, Ser: 0.47 mg/dL — ABNORMAL LOW (ref 0.50–1.35)
GFR calc Af Amer: >90 mL/min (ref >90)
GFR calc non Af Amer: >90 mL/min (ref >90)

## 2011-09-28 LAB — PROTIME-INR: Prothrombin Time: 15.2 seconds (ref 11.6–15.2)

## 2011-09-28 LAB — CBC
MCHC: 31.2 g/dL (ref 30.0–36.0)
RDW: 15.8 % — ABNORMAL HIGH (ref 11.5–15.5)

## 2011-09-28 NOTE — Progress Notes (Signed)
  Echocardiogram 2D Echocardiogram has been performed.  Francisco Tanner Francisco Tanner 09/28/2011, 8:39 AM

## 2011-09-28 NOTE — Progress Notes (Signed)
Physical Therapy Treatment Patient Details Name: Francisco Tanner MRN: [id number] DOB: [birth year] Today's Date: 09/28/2011  PT Assessment/Plan  PT - Assessment/Plan Comments on Treatment Session: pt sats 88-90% on 2L with activity, 2-3DOE; pt doing well, feeling better; LLE with increased edema this pm, elevated foot rest and propped LLE on pillow, reclined chair back to increase angle at hip/THP, encouraged ankle pumps PT Plan: Discharge plan remains appropriate;Frequency remains appropriate PT Frequency: 7X/week Follow Up Recommendations: Home health PT;Skilled nursing facility (pt states he is likely going SNF) PT Goals  Acute Rehab PT Goals Pt will go Sit to Stand: with supervision PT Goal: Sit to Stand - Progress: Progressing toward goal Pt will go Stand to Sit: with supervision PT Goal: Stand to Sit - Progress: Progressing toward goal Pt will Ambulate: 51 - 150 feet;with supervision;with least restrictive assistive device PT Goal: Ambulate - Progress: Progressing toward goal  PT Treatment Precautions/Restrictions  Precautions Precautions: Posterior Hip;Fall Required Braces or Orthoses: Yes Knee Immobilizer:  (on when in bed, can remove for ambulation)) Restrictions Weight Bearing Restrictions: No LLE Weight Bearing: Weight bearing as tolerated Mobility (including Balance) Transfers Sit to Stand: 5: Supervision;From chair/3-in-1;With armrests Sit to Stand Details (indicate cue type and reason): subtle cues for hands  Stand to Sit: 5: Supervision;With armrests;To chair/3-in-1 Stand to Sit Details: cues for hands and safety Ambulation/Gait Ambulation/Gait Assistance: 5: Supervision (min/guard) Ambulation/Gait Assistance Details (indicate cue type and reason): cues for RW distance from self and breathing Ambulation Distance (Feet): 195 Feet Assistive device: Rolling walker Gait Pattern: Step-to pattern    Exercise  General Exercises - Lower Extremity Ankle Circles/Pumps:  AROM;Both;15 reps End of Session PT - End of Session Activity Tolerance: Patient tolerated treatment well Patient left: in chair;with call bell in reach General Behavior During Session: [facility] for tasks performed Cognition: [facility] for tasks performed  [facility] 09/28/2011, 4:46 PM

## 2011-09-28 NOTE — Progress Notes (Signed)
Physical Therapy Treatment Patient Details Name: Francisco Tanner MRN: [id number] DOB: [birth year] Today's Date: 09/28/2011  PT Assessment/Plan  PT - Assessment/Plan Comments on Treatment Session: pt feeling better, does now state he is planning for SNF( he thinks) PT Plan: Discharge plan remains appropriate;Frequency remains appropriate PT Frequency: 7X/week Follow Up Recommendations: Home health PT Equipment Recommended:  (if home wheels for his standard walker) PT Goals  Acute Rehab PT Goals Pt will go Supine/Side to Sit: with modified independence PT Goal: Supine/Side to Sit - Progress: Progressing toward goal Pt will go Sit to Stand: with supervision PT Goal: Sit to Stand - Progress: Progressing toward goal Pt will go Stand to Sit: with supervision PT Goal: Stand to Sit - Progress: Progressing toward goal Pt will Ambulate: 51 - 150 feet;with supervision;with least restrictive assistive device PT Goal: Ambulate - Progress: Progressing toward goal Pt will Perform Home Exercise Program: with supervision, verbal cues required/provided PT Goal: Perform Home Exercise Program - Progress: Progressing toward goal  PT Treatment Precautions/Restrictions  Precautions Precautions: Posterior Hip;Fall Required Braces or Orthoses: Yes Knee Immobilizer:  (on when in bed, can remove for ambulation)) Restrictions Weight Bearing Restrictions: No LLE Weight Bearing: Weight bearing as tolerated Mobility (including Balance) Bed Mobility Supine to Sit: 5: Supervision;HOB flat Supine to Sit Details (indicate cue type and reason): cues to avoid use of rails Sitting - Scoot to Edge of Bed: 5: Supervision Transfers Sit to Stand: 5: Supervision;From bed;With upper extremity assist Sit to Stand Details (indicate cue type and reason): verbal cues for L LE forward and hand placement Stand to Sit: 5: Supervision;4: Min assist;To bed;With upper extremity assist Stand to Sit Details: min/guard, verbal cues for  L LE forward and hand placement Ambulation/Gait Ambulation/Gait: Yes Ambulation/Gait Assistance: 5: Supervision Ambulation/Gait Assistance Details (indicate cue type and reason): cues for RW distance from self and breathing Ambulation Distance (Feet): 170 Feet Assistive device: Rolling walker Gait Pattern: Step-to pattern    Exercise  Total Joint Exercises Long Arc Quad: AROM;Left;15 reps;Seated General Exercises - Lower Extremity Ankle Circles/Pumps: AROM;Both;15 reps End of Session PT - End of Session Activity Tolerance: Patient tolerated treatment well Patient left: in chair;with call bell in reach Nurse Communication: Mobility status for transfers;Mobility status for ambulation General Behavior During Session: [facility] for tasks performed Cognition: [facility] for tasks performed  [facility] 09/28/2011, 1:12 PM

## 2011-09-28 NOTE — Progress Notes (Signed)
Francisco Tanner  MRN: [id number] DOB/Age: 1946-06-01 66 y.o. Physician: Jacquelyne Balint Procedure: Procedure(s) (LRB): IRRIGATION AND DEBRIDEMENT HIP (Left)     Subjective: Alert and oriented. Multiple questions for Dr. Lequita Halt re ongoing care for hip. Currently minimally painful but slow with PT. He lives alone and has multiple other co morbidities.   Vital Signs Temp:  [97.9 F (36.6 C)-98.7 F (37.1 C)] 97.9 F (36.6 C) (03/03 0527) Pulse Rate:  [65-69] 68  (03/03 0527) Resp:  [18] 18  (03/03 0527) BP: (124-137)/(69-81) 136/81 mmHg (03/03 0527) SpO2:  [96 %-97 %] 97 % (03/03 0527) Weight:  [103.103 kg (227 lb 4.8 oz)] 103.103 kg (227 lb 4.8 oz) (03/03 0527)  Lab Results  Basename 09/28/11 0546 09/27/11 0504  WBC 3.7* 3.9*  HGB 8.8* 8.1*  HCT 28.2* 25.8*  PLT 290 204   BMET  Basename 09/28/11 0546  NA 140  K 3.4*  CL 99  CO2 37*  GLUCOSE 96  BUN 6  CREATININE 0.47*  CALCIUM 8.5   INR  Date Value Range Status  09/28/2011 1.18  0.00-1.49 (no units) Final     Exam LLE with diffuse swelling and resolving ecchymosis.\NVI        Plan Continue medical management per hospitalist. Dispostion planning for possible skilled short term Dr. Lequita Halt to return tom.   Francisco Tanner for Dr.Kevin Supple 09/28/2011, 9:29 AM

## 2011-09-28 NOTE — Plan of Care (Signed)
Problem: Phase I Progression Outcomes Goal: Initial discharge plan identified Outcome: Completed/Met Date Met:  09/28/11 Pt plans d/c to ST-SNF for rehab

## 2011-09-28 NOTE — Progress Notes (Signed)
Subjective: Feels much better, more talkative. Still no SOB or increased WOB with talking.  Objective: Vital signs in last 24 hours: Temp:  [97.9 F (36.6 C)-98.7 F (37.1 C)] 97.9 F (36.6 C) (03/03 0527) Pulse Rate:  [65-69] 68  (03/03 0527) Resp:  [18] 18  (03/03 0527) BP: (124-137)/(69-81) 136/81 mmHg (03/03 0527) SpO2:  [96 %-97 %] 97 % (03/03 0936) Weight:  [103.103 kg (227 lb 4.8 oz)] 103.103 kg (227 lb 4.8 oz) (03/03 0527) Weight change:  Last BM Date: 09/27/11  Intake/Output from previous day: 03/02 0701 - 03/03 0700 In: 1815 [I.V.:1815] Out: 2650 [Urine:2650]     Physical Exam: General: Alert, awake, oriented x3, in no acute distress. HEENT: No bruits, no goiter. Heart: Regular rate and rhythm, without murmurs, rubs, gallops. Lungs: Clear to auscultation bilaterally. Abdomen: Soft, nontender, nondistended, positive bowel sounds. Extremities: Very swollen and bruised left leg. Neuro: Grossly intact, nonfocal.    Lab Results: Basic Metabolic Panel:  [facility] 09/28/11 0546  NA 140  K 3.4*  CL 99  CO2 37*  GLUCOSE 96  BUN 6  CREATININE 0.47*  CALCIUM 8.5  MG --  PHOS --   CBC:  Basename 09/28/11 0546 09/27/11 0504  WBC 3.7* 3.9*  NEUTROABS -- --  HGB 8.8* 8.1*  HCT 28.2* 25.8*  MCV 92.5 92.8  PLT 290 204   CBG:  Basename 09/26/11 0736  GLUCAP 113*   Coagulation:  Basename 09/28/11 0546 09/27/11 0504  LABPROT 15.2 14.9  INR 1.18 1.15    Studies/Results: Dg Chest Port 1 View  09/27/2011  *RADIOLOGY REPORT*  Clinical Data: Hypoxemia.  PORTABLE CHEST - 1 VIEW  Comparison: Radiographs 04/28/2011.  CT 02/07/2011 and 09/22/2011.  Findings: The heart size and mediastinal contours are stable. There is generalized interstitial prominence suspicious for mild edema.  The asymmetric density at the right lung base noted on prior examinations has improved as demonstrated on the recent abdominal CT and may be related to an evolving pulmonary infarct in  this area.  No significant pleural effusion is identified.  IMPRESSION:  1.  Increased interstitial prominence suspicious for mild edema. 2.  Resolving right basilar nodular density suggesting an evolving infarct.  The recent abdominal CT showed a new ground-glass opacity slightly more posteriorly in the right lower lobe for which follow- up chest CT in 3 months is suggested.  Original Report Authenticated By: Gerrianne Scale, M.D.    Medications: Scheduled Meds:   . aspirin  81 mg Oral Daily  . budesonide-formoterol  2 puff Inhalation BID  . docusate sodium  100 mg Oral BID  . furosemide  60 mg Intravenous Once  . metoprolol succinate  75 mg Oral Daily  . omeprazole  20 mg Oral Q1200  . oxybutynin  5 mg Oral Daily  . Tamsulosin HCl  0.4 mg Oral QHS  . zolpidem  10 mg Oral QHS   Continuous Infusions:   . lactated ringers    . DISCONTD: 0.9 % NaCl with KCl 20 mEq / L 75 mL/hr at 09/27/11 0047   PRN Meds:.albuterol, bisacodyl, HYDROcodone-acetaminophen, HYDROmorphone, HYDROmorphone (DILAUDID) injection, HYDROmorphone, ipratropium, meperidine, methocarbamol(ROBAXIN) IV, methocarbamol, metoCLOPramide (REGLAN) injection, metoCLOPramide, ondansetron (ZOFRAN) IV, ondansetron (ZOFRAN) IV, ondansetron, ondansetron, oxyCODONE-acetaminophen, polyethylene glycol, promethazine  Assessment/Plan:  Principal Problem:  *Anticoagulant long-term use Active Problems:  PULMONARY HYPERTENSION, SECONDARY  Atrial flutter  COPD UNSPECIFIED  Pulmonary embolism  Morbid obesity  Hematoma   #1 Left hip hematoma and dislocation: POD 4 left hip washout. Management  as per ortho.  #2 ABLA: Has received a total of 2 units PRBCs. Hb stable. No need for transfusion at this point.  #3 Hypoxemia: Improved. Had some mild pulmonary edema on CXR, likely a result from volume received and slow mobilization. Improved after lasix. Only requiring 1-2 liters of oxygen via .  #4 COPD: stable.  #5 Dispo: Likely to  ST-SNF. Hopefully in am.   LOS: 6 days   [facility] Pager: [phone] 09/28/2011, 9:45 AM

## 2011-09-29 LAB — PROTIME-INR
INR: 1.12 (ref 0.00–1.49)
Prothrombin Time: 14.6 seconds (ref 11.6–15.2)

## 2011-09-29 LAB — BASIC METABOLIC PANEL
CO2: 36 mEq/L — ABNORMAL HIGH (ref 19–32)
Calcium: 8.6 mg/dL (ref 8.4–10.5)
Creatinine, Ser: 0.51 mg/dL (ref 0.50–1.35)
GFR calc Af Amer: >90 mL/min (ref >90)

## 2011-09-29 MED ORDER — HYDROCODONE-ACETAMINOPHEN 5-325 MG PO TABS: 1.0000 | ORAL_TABLET | ORAL | Status: AC | PRN

## 2011-09-29 MED ORDER — ASPIRIN 81 MG PO CHEW: 81.0000 mg | CHEWABLE_TABLET | Freq: Every day | ORAL | Status: DC

## 2011-09-29 NOTE — Discharge Summary (Signed)
Physician Discharge Summary  Patient ID: Francisco Tanner MRN: [id number] DOB/AGE: 09-15-1945 66 y.o.  Admit date: 09/22/2011 Discharge date: 09/29/2011  Primary Care Physician:  Josue Hector, MD, MD   Discharge Diagnoses:    Principal Problem:  *Anticoagulant long-term use Active Problems:  PULMONARY HYPERTENSION, SECONDARY  Atrial flutter  COPD UNSPECIFIED  Pulmonary embolism  Morbid obesity  Hematoma, left hip    Medication List  As of 09/29/2011 10:56 AM   STOP taking these medications         HYDROcodone-acetaminophen 5-500 MG per tablet      oxyCODONE-acetaminophen 5-325 MG per tablet      warfarin 2.5 MG tablet         TAKE these medications         AMBIEN 10 MG tablet   Generic drug: zolpidem   Take 10 mg by mouth at bedtime.      aspirin 81 MG chewable tablet   Chew 1 tablet (81 mg total) by mouth daily.      budesonide-formoterol 160-4.5 MCG/ACT inhaler   Commonly known as: SYMBICORT   Inhale 2 puffs into the lungs 2 (two) times daily.      ferrous sulfate 325 (65 FE) MG tablet   Take 325 mg by mouth daily.      FLOMAX 0.4 MG Caps   Generic drug: Tamsulosin HCl   Take 0.4 mg by mouth at bedtime.      HYDROcodone-acetaminophen 5-325 MG per tablet   Commonly known as: NORCO   Take 1-2 tablets by mouth every 4 (four) hours as needed.      metoprolol succinate 50 MG 24 hr tablet   Commonly known as: TOPROL-XL   Take 75 mg by mouth daily. 1 1/2 tablets daily      omeprazole 20 MG capsule   Commonly known as: PRILOSEC   Take 20 mg by mouth daily.      oxybutynin 5 MG tablet   Commonly known as: DITROPAN   Take 5 mg by mouth daily.             Disposition and Follow-up:  Will be discharged to SNF today for ST-rehab purposes. After discussion with PCP Dr. Lysbeth Galas, we have decided to discontinue coumadin at least for the short term (2-3 weeks). Should not need it any longer for the PE (as this was in July 2012); however will need to discuss  with his cardiologist, Dr. Antoine Poche, about long-term risk benefit given his h/o a flutter.  Consults:  orthopedic surgery Dr. Despina Hick   Significant Diagnostic Studies:  Ct Abdomen Pelvis Wo Contrast  09/22/2011  *RADIOLOGY REPORT*  Clinical Data: Recent left hip dislocation and reduction.  Left lower abdominal pain.  CT ABDOMEN AND PELVIS WITHOUT CONTRAST  Technique:  Multidetector CT imaging of the abdomen and pelvis was performed following the standard protocol without intravenous contrast.  Comparison: Chest CT 02/07/2011  Findings: There is a ground-glass opacity in the right lower lung which is incompletely imaged.   A stable subpleural nodular density along the right hemidiaphragm.  No evidence for free air.  There is a subtle 8 mm low density in the anterior liver on image 23 which appears unchanged. There is also a stable round 1.5 cm low density structure in the caudate which most likely represents a cyst.  No gross abnormality to the gallbladder, pancreas or spleen. Stable nodularity of the left adrenal gland with Hounsfield units around 10.  This most likely represents an adenoma.  There  are two 3 mm calcifications in the right kidney.  Small exophytic low- density structure along the posterior right kidney roughly measures 7 mm.  No evidence of left kidney stones.  No evidence of hydronephrosis.  Diffuse diverticulosis in the left colon.  There is high-density material surrounding the posterior aspect of the left hip and proximal left femur.  Findings are most compatible with hematoma formation.  There appears to be fluid-blood levels in the superior aspect of this collection, best seen on image 73. There is subcutaneous swelling in the soft tissues lateral to the left hip.  There is high density stranding in the left retroperitoneum that extends up to the aortic bifurcation. Findings are consistent with a left retroperitoneal hematoma. There is an unusual configuration of the left external iliac  artery on image 64 which may be related to an adjacent small lymph node. Aneurysm in this area cannot be excluded on this noncontrast examination.  The iliac vessels are heavily calcified.  The left hip prosthesis is located and the right hip is located. Compression fractures at T12, L1, L2 and L3 with bone cement. 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  There may be mild compression deformity at L5 as well.  IMPRESSION: Hematoma surrounding the posterior left hip with a small amount of retroperitoneal blood in the left hemi pelvis.  Unusual configuration of the left external iliac artery probably related to an adjacent lymph node but cannot exclude a small aneurysm in this area.  This area could be further evaluated with a postcontrast CT examination.  Ground-glass opacity in the right lower lobe is incompletely imaged.  This is new from the prior examination. This could represent an infectious or inflammatory process of unknown age. The patient would benefit from a follow-up chest CT for further evaluation.  Please note that ground-glass opacities could also be associated with lung neoplasms.  Calcifications in the right right kidney may represent nonobstructing stones.  Too small to definitely characterize low density structures in the liver and right kidney.  Original Report Authenticated By: Richarda Overlie, M.D.   Dg Lumbar Spine Complete  09/22/2011  *RADIOLOGY REPORT*  Clinical Data: Low back and left PET hip pain, recent dislocation of left hip replacement  LUMBAR SPINE - COMPLETE 4+ VIEW  Comparison: Lumbar spine films of 10/27/2009  Findings: Vertebroplasties have been performed at T12, L1, L2, and L3 levels.  Compared to the post vertebroplasty images from 05/28/2011, the degree of compressions of these lumbar vertebral bodies appears stable.  No new definite compression deformity is seen.  Pars defects at L5 again are noted.  IMPRESSION: Stable vertebroplasties from  T12-L3.  No new compression deformity. Diffuse osteopenia.  Original Report Authenticated By: Juline Patch, M.D.   Dg Hip Complete Left  09/22/2011  *RADIOLOGY REPORT*  Clinical Data: Left hip pain, recent dislocation  LEFT HIP - COMPLETE 2+ VIEW  Comparison: Portable left hip films of 09/20/2011  Findings: The femoral and acetabular components of the left hip replacement are in good position.  No acute fracture is seen.  No dislocation is noted.  The bones are very osteopenic.  IMPRESSION: Left total hip replacement in good position .  No acute abnormality.  Osteopenia.  Original Report Authenticated By: Juline Patch, M.D.   Ct Pelvis W Contrast  09/22/2011  *RADIOLOGY REPORT*  Clinical Data:  Hematoma surrounding the postoperative left hip joint, with associated retroperitoneal blood.  Question of abnormal configuration of the left  external iliac artery on prior CT. Contrast study recommended for further evaluation.  CT PELVIS WITH CONTRAST  Technique:  Multidetector CT imaging of the pelvis was performed using the standard protocol following the bolus administration of intravenous contrast.  Contrast:   100 mL of Omnipaque 300 IV contrast  Comparison:  CT of the abdomen and pelvis performed earlier today at 07:53 p.m.  Findings:  A large hematoma is again noted within the proximal left thigh musculature surrounding the patient's left hip prosthesis; this appears slightly increased in size from the recent CT of the abdomen and pelvis, measuring approximately 10.9 x 10.0 cm.  The inferior extent of the hematoma is not seen on this study.  The patient's left hip total arthroplasty appears grossly intact, though incompletely imaged on this study.  Suggested cortical irregularity along the left superior pubic ramus appears to be artifactual in nature, on correlation with coronal images. There is no definite evidence of new fracture.  The degree of retroperitoneal stranding and hemorrhage is relatively stable  from the prior study.  This remains relatively mild.  However, note is made of an inflamed diverticulum along the proximal sigmoid colon, with surrounding soft tissue stranding. Some of the findings at the left lower quadrant could reflect mild acute diverticulitis, though the pelvic sidewall stranding likely reflects mild hemorrhage.  The small nodular density along the left external iliac artery reflects a small lymph node; the configuration of the left external iliac artery remains within normal limits.  There is prominent intramural thrombus and diffuse calcification along the distal abdominal aorta and its branches; likely severe stenosis is noted along the right common and external iliac arteries.  Diffuse diverticulosis involves the visualized distal descending and sigmoid colon. Visualized small bowel loops are grossly unremarkable.  The bladder is mildly distended; mild surrounding soft tissue stranding is nonspecific in appearance.  There is mild grade 1 anterolisthesis of L5 on S1, with chronic bilateral pars defects.  IMPRESSION:  1.  Slight interval increase in size of large partially intramuscular hematoma surrounding the left hip prosthesis and extending inferiorly along the proximal left thigh, measuring 10.9 x 10.0 cm; the inferior extent of the hematoma is not characterized on this study. 2.  Relatively mild retroperitoneal stranding and hemorrhage is relatively stable in appearance.  Note made of an inflamed diverticula along the proximal sigmoid colon, with surrounding soft tissue stranding.  This could conceivably reflect mild acute diverticulitis, depending on the patient's symptoms. 3.  Small nodular density along the left external iliac artery reflects a small lymph node; the configuration of the left external iliac artery remains within normal limits. 4.  Prominent intramural thrombus and diffuse calcification along the distal abdominal aorta and its branches; likely severe stenosis noted  along the right common and external iliac arteries. 5.  Diffuse diverticulosis involves the distal descending and sigmoid colon. 6.  Mild grade 1 anterolisthesis of L5 on S1, with chronic bilateral pars defects.  These results were called by telephone on 09/21/2010  at  10:40 p.m. to  Dr. Quita Skye, who verbally acknowledged these results.  Original Report Authenticated By: Tonia Ghent, M.D.    2D ECHO:  Left ventricle: The cavity size was normal. Wall thickness was normal. Systolic function was normal. The estimated ejection fraction was in the range of 50% to 55%. Wall motion was normal; there were no regional wall motion abnormalities. Left ventricular diastolic function parameters were normal.   Brief H and P: For complete details please refer  to admission H and P, but in brief this is a 66 year old gentleman who lives alone. He has a history of a left hip replacement in 2010. 2 days ago he was walking in his kitchen when his hip gave out. He came to the ER were imaging reveals a dislocation of the left hip. This was reduced and he was sent home. Additionally his INR was 6.51, he was told to hold his Coumadin. He states that this pain has continued to worsen significantly in the left hip. He was unable to walk so he came back to the ER. He was found to have a left hip hematoma and we were asked to admit him for further evaluation and management.       Hospital Course:  Principal Problem:  *Anticoagulant long-term use Active Problems:  PULMONARY HYPERTENSION, SECONDARY  Atrial flutter  COPD UNSPECIFIED  Pulmonary embolism  Morbid obesity  Hematoma, left hip  #1 Left hip hematoma and dislocation: POD 5 left hip washout. To SNF for short-term rehab purposes.  #2 ABLA: Has received a total of 2 units PRBCs. Hb stable. No need for transfusion at this point.   #3 Hypoxemia: Improved. Had some mild pulmonary edema on CXR, likely a result from volume received and slow  mobilization. Improved after lasix. Only requiring 1-2 liters of oxygen via [place].  Echo should no evidence for systolic or diastolic dysfunction.  #4 COPD: stable.    Time spent on Discharge: Greater than 30 minutes.  SignedChaya Jan [facility] Pager: [phone] 09/29/2011, 10:56 AM

## 2011-09-29 NOTE — Progress Notes (Signed)
Patient set to discharge to [facility] SN[facility] today. Patient & facility aware & agreeable. [facility] called for transportation.   Unice Bailey, LCSWA [phone]

## 2011-09-29 NOTE — Progress Notes (Signed)
Subjective: 5 Days Post-Op Procedure(s) (LRB): IRRIGATION AND DEBRIDEMENT HIP (Left) Patient reports pain as improved. More of a generalized muscle soreness in the leg now Patient has concerns over coming of Coumadin. I reiterated that this was the decision of the medical team based on his propensity to bleed  Objective: Vital signs in last 24 hours: Temp:  [97.8 F (36.6 C)-98.3 F (36.8 C)] 98.3 F (36.8 C) (03/04 0526) Pulse Rate:  [58-61] 58  (03/04 0526) Resp:  [16-18] 18  (03/04 0526) BP: (147-171)/(75-92) 147/79 mmHg (03/04 0526) SpO2:  [93 %-98 %] 93 % (03/04 0526)  Intake/Output from previous day: No intake or output data in the 24 hours ending 09/29/11 0654  Intake/Output this shift:    Labs:  Basename 09/28/11 0546 09/27/11 0504  HGB 8.8* 8.1*    Basename 09/28/11 0546 09/27/11 0504  WBC 3.7* 3.9*  RBC 3.05* 2.78*  HCT 28.2* 25.8*  PLT 290 204    Basename 09/29/11 0414 09/28/11 0546  NA 140 140  K 3.5 3.4*  CL 99 99  CO2 36* 37*  BUN 6 6  CREATININE 0.51 0.47*  GLUCOSE 92 96  CALCIUM 8.6 8.5    Basename 09/29/11 0414 09/28/11 0546  LABPT -- --  INR 1.12 1.18    Exam - Neurologically intact Dorsiflexion/Plantar flexion intact Incision: no drainage No cellulitis present Compartment soft Dressing/Incision - clean, dry, no drainage Motor function intact - moving foot and toes well on exam.  Thigh swelling considerably decreased  Past Medical History  Diagnosis Date  . CHF (congestive heart failure)   . COPD (chronic obstructive pulmonary disease)     [place]. PFTs 10/09/08 FEV1 1.39 (44%) ratio 40, DLCO 63%. 16% improvement after bronchodilator. overnight pulse ox 02/28/09 5:51m pent with sat< 89 sleeping > declined further o2 07/05/09. HFA 75% 04/23/09.   [name] Atrial flutter     echo 09/17/08 nl EF, mild LAE, RV mod dilated, pasystolic 46. noct desat 02/28/09, refused 02 @ hs   . Abnormal CT scan     SPN right mid zone. first detected 08/16/08; see [facility]    . Weight gain     target weight 196; BMI <30  . Lung nodule   . Hypertension     Assessment/Plan: 5 Days Post-Op Procedure(s) (LRB): IRRIGATION AND DEBRIDEMENT HIP (Left) Principal Problem:  *Anticoagulant long-term use Active Problems:  PULMONARY HYPERTENSION, SECONDARY  Atrial flutter  COPD UNSPECIFIED  Pulmonary embolism  Morbid obesity  Hematoma   Up with therapy Discharge to SNF- Pt has agreed to SNF as he has no help at home.   Weight-Bearing as tolerated to left leg  Jamice Carreno V 09/29/2011, 6:54 AM

## 2011-11-03 ENCOUNTER — Encounter: Payer: Self-pay | Admitting: Cardiology

## 2011-11-03 ENCOUNTER — Ambulatory Visit (INDEPENDENT_AMBULATORY_CARE_PROVIDER_SITE_OTHER): Payer: Medicare Other | Admitting: Cardiology

## 2011-11-03 VITALS — BP 118/75 | HR 74 | Ht 67.0 in | Wt 216.0 lb

## 2011-11-03 DIAGNOSIS — I5030 Unspecified diastolic (congestive) heart failure: Secondary | ICD-10-CM

## 2011-11-03 DIAGNOSIS — I4892 Unspecified atrial flutter: Secondary | ICD-10-CM

## 2011-11-03 NOTE — Patient Instructions (Signed)
Your physician wants you to follow-up in: 6 months. You will receive a reminder letter in the mail one-two months in advance. If you don't receive a letter, please call our office to schedule the follow-up appointment. Your physician recommends that you continue on your current medications as directed. Please refer to the Current Medication list given to you today. 

## 2011-11-03 NOTE — Progress Notes (Signed)
HPI The patient presents for followup after recent hospitalization for management of  atrial flutter s/p TEE cardioversion. He has also pulmonary embolism and pulmonary infarcts.  He was also found to have a lung nodule.  He has had hip surgery and revision.  He did have hemarthrosis apparently related to his blood and her being supratherapeutic.  Since I last saw himhas had no tachycardia palpitations. He is unfortunately very limited with his joint problems. With activity he's not getting any chest pressure, neck or arm discomfort. He's having no new shortness of breath, PND or orthopnea. He is back on his warfarin.  No Known Allergies  Current Outpatient Prescriptions  Medication Sig Dispense Refill  . budesonide-formoterol (SYMBICORT) 160-4.5 MCG/ACT inhaler Inhale 2 puffs into the lungs 2 (two) times daily.       [name] HYDROcodone-acetaminophen (VICODIN) 5-500 MG per tablet Take 1 tablet by mouth every 6 (six) hours as needed.      . metoprolol (TOPROL-XL) 50 MG 24 hr tablet Take 75 mg by mouth daily. 1 1/2 tablets daily      . omeprazole (PRILOSEC) 20 MG capsule Take 20 mg by mouth daily.      [name] oxybutynin (DITROPAN) 5 MG tablet Take 5 mg by mouth daily.        . Tamsulosin HCl (FLOMAX) 0.4 MG CAPS Take 0.4 mg by mouth at bedtime.        . torsemide (DEMADEX) 20 MG tablet Take 1/2 tab daily      . warfarin (COUMADIN) 5 MG tablet As directed      . zolpidem (AMBIEN) 10 MG tablet Take 10 mg by mouth at bedtime.         Past Medical History  Diagnosis Date  . CHF (congestive heart failure)   . COPD (chronic obstructive pulmonary disease)     Wert. PFTs 10/09/08 FEV1 1.39 (44%) ratio 40, DLCO 63%. 16% improvement after bronchodilator. overnight pulse ox 02/28/09 5:11m pent with sat< 89 sleeping > declined further o2 07/05/09. HFA 75% 04/23/09.   [name] Atrial flutter     echo 09/17/08 nl EF, mild LAE, RV mod dilated, pasystolic 46. noct desat 02/28/09, refused 02 @ hs   . Abnormal CT scan     SPN  right mid zone. first detected 08/16/08; see [facility]   . Weight gain     target weight 196; BMI <30  . Lung nodule   . Hypertension     Past Surgical History  Procedure Date  . Hip replacement surgery     L   . Kyphosis surgery   . Back surgery     x4  . Knee surgery   . Cardioversion     in past  . Incision and drainage hip 09/24/2011    Procedure: IRRIGATION AND DEBRIDEMENT HIP;  Surgeon: Loanne Drilling, MD;  Location: [facility];  Service: Orthopedics;  Laterality: Left;    ROS:  As stated in the HPI and negative for all other systems.  PHYSICAL EXAM BP 118/75  Pulse 74  Ht 5\' 7"  (1.702 m)  Wt 216 lb (97.977 kg)  BMI 33.83 kg/m2 PHYSICAL EXAM GEN:  No distress NECK:  No jugular venous distention at 90 degrees, waveform within normal limits, carotid upstroke brisk and symmetric, no bruits, no thyromegaly LYMPHATICS:  No cervical adenopathy LUNGS:  Clear to auscultation bilaterally BACK:  No CVA tenderness CHEST:  Unremarkable HEART:  S1 and S2 within normal limits, no S3, no S4, no  clicks, no rubs, no murmurs ABD:  Positive bowel sounds normal in frequency in pitch, no bruits, no rebound, no guarding, unable to assess midline mass or bruit with the patient seated. EXT:  2 plus pulses upper and diminished bilateral lower, moderate edema, no cyanosis no clubbing SKIN:  No rashes no nodules NEURO:  Cranial nerves II through XII grossly intact, motor grossly intact throughout PSYCH:  Cognitively intact, oriented to person place and time  EKG:  Sinus rhythm, right bundle branch block, right axis deviation, rate 74, no change from previous.  11/03/2011  ASSESSMENT AND PLAN

## 2011-11-03 NOTE — Assessment & Plan Note (Signed)
He has had no symptomatic paroxysms of this. No change in therapy is indicated. 

## 2011-11-03 NOTE — Assessment & Plan Note (Signed)
He does have some mild lower extremity edema. However, this is probably related to his inactivity and the fact that his feet are down. We discussed conservative therapies. He has been started on a diuretic.

## 2012-01-28 ENCOUNTER — Inpatient Hospital Stay (HOSPITAL_COMMUNITY)
Admission: AD | Admit: 2012-01-28 | Discharge: 2012-02-03 | DRG: 250 | Disposition: A | Discharge status: Home / Self Care | Payer: Medicare Other | Source: Ambulatory Visit | Attending: Cardiology | Admitting: Cardiology

## 2012-01-28 ENCOUNTER — Encounter: Payer: Self-pay | Admitting: Cardiology

## 2012-01-28 ENCOUNTER — Ambulatory Visit (INDEPENDENT_AMBULATORY_CARE_PROVIDER_SITE_OTHER): Payer: Medicare Other | Admitting: Cardiology

## 2012-01-28 ENCOUNTER — Encounter (HOSPITAL_COMMUNITY): Payer: Self-pay | Admitting: Physician Assistant

## 2012-01-28 VITALS — BP 140/70 | HR 134 | Ht 67.0 in | Wt 240.0 lb

## 2012-01-28 DIAGNOSIS — I4892 Unspecified atrial flutter: Principal | ICD-10-CM

## 2012-01-28 DIAGNOSIS — I10 Essential (primary) hypertension: Secondary | ICD-10-CM

## 2012-01-28 DIAGNOSIS — Z79899 Other long term (current) drug therapy: Secondary | ICD-10-CM

## 2012-01-28 DIAGNOSIS — I5032 Chronic diastolic (congestive) heart failure: Secondary | ICD-10-CM | POA: Diagnosis present

## 2012-01-28 DIAGNOSIS — I5033 Acute on chronic diastolic (congestive) heart failure: Secondary | ICD-10-CM | POA: Diagnosis present

## 2012-01-28 DIAGNOSIS — I509 Heart failure, unspecified: Secondary | ICD-10-CM | POA: Diagnosis present

## 2012-01-28 DIAGNOSIS — L0390 Cellulitis, unspecified: Secondary | ICD-10-CM

## 2012-01-28 DIAGNOSIS — I2789 Other specified pulmonary heart diseases: Secondary | ICD-10-CM | POA: Diagnosis present

## 2012-01-28 DIAGNOSIS — J984 Other disorders of lung: Secondary | ICD-10-CM | POA: Diagnosis present

## 2012-01-28 DIAGNOSIS — Z7901 Long term (current) use of anticoagulants: Secondary | ICD-10-CM

## 2012-01-28 DIAGNOSIS — J449 Chronic obstructive pulmonary disease, unspecified: Secondary | ICD-10-CM | POA: Diagnosis present

## 2012-01-28 DIAGNOSIS — R0902 Hypoxemia: Secondary | ICD-10-CM | POA: Diagnosis present

## 2012-01-28 DIAGNOSIS — R911 Solitary pulmonary nodule: Secondary | ICD-10-CM | POA: Diagnosis present

## 2012-01-28 DIAGNOSIS — Z86711 Personal history of pulmonary embolism: Secondary | ICD-10-CM

## 2012-01-28 DIAGNOSIS — J4489 Other specified chronic obstructive pulmonary disease: Secondary | ICD-10-CM | POA: Diagnosis present

## 2012-01-28 HISTORY — DX: Chronic diastolic (congestive) heart failure: I50.32

## 2012-01-28 LAB — CARDIAC PANEL(CRET KIN+CKTOT+MB+TROPI)
CK, MB: 3.8 ng/mL (ref 0.3–4.0)
Relative Index: INVALID (ref 0.0–2.5)
Troponin I: <0.3 ng/mL (ref <0.30)

## 2012-01-28 LAB — BASIC METABOLIC PANEL
BUN: 12 mg/dL (ref 6–23)
CO2: 29 mEq/L (ref 19–32)
Chloride: 103 mEq/L (ref 96–112)
Creatinine, Ser: 0.6 mg/dL (ref 0.50–1.35)
Glucose, Bld: 105 mg/dL — ABNORMAL HIGH (ref 70–99)
Potassium: 3.7 mEq/L (ref 3.5–5.1)

## 2012-01-28 LAB — CBC
Hemoglobin: 13.7 g/dL (ref 13.0–17.0)
MCH: 28.1 pg (ref 26.0–34.0)
MCHC: 31.1 g/dL (ref 30.0–36.0)
MCV: 90.3 fL (ref 78.0–100.0)
Platelets: 163 10*3/uL (ref 150–400)
RBC: 4.87 MIL/uL (ref 4.22–5.81)

## 2012-01-28 LAB — PROTIME-INR: Prothrombin Time: 36.1 seconds — ABNORMAL HIGH (ref 11.6–15.2)

## 2012-01-28 LAB — MRSA PCR SCREENING: MRSA by PCR: NEGATIVE

## 2012-01-28 MED ORDER — FUROSEMIDE 10 MG/ML IJ SOLN
40.0000 mg | Freq: Two times a day (BID) | INTRAMUSCULAR | Status: DC
  Administered 2012-01-28 – 2012-01-29 (×3): 40 mg via INTRAVENOUS
  Filled 2012-01-28 (×6): qty 4

## 2012-01-28 MED ORDER — PANTOPRAZOLE SODIUM 40 MG PO TBEC
40.0000 mg | DELAYED_RELEASE_TABLET | Freq: Every day | ORAL | Status: DC
  Administered 2012-01-28 – 2012-02-03 (×7): 40 mg via ORAL
  Filled 2012-01-28 (×8): qty 1

## 2012-01-28 MED ORDER — ASPIRIN EC 81 MG PO TBEC
81.0000 mg | DELAYED_RELEASE_TABLET | Freq: Every day | ORAL | Status: DC
  Administered 2012-01-28 – 2012-01-30 (×3): 81 mg via ORAL
  Filled 2012-01-28 (×4): qty 1

## 2012-01-28 MED ORDER — CEPHALEXIN 500 MG PO CAPS
500.0000 mg | ORAL_CAPSULE | Freq: Four times a day (QID) | ORAL | Status: DC
  Administered 2012-01-28 – 2012-01-29 (×3): 500 mg via ORAL
  Filled 2012-01-28 (×7): qty 1

## 2012-01-28 MED ORDER — SODIUM CHLORIDE 0.9 % IJ SOLN
3.0000 mL | Freq: Two times a day (BID) | INTRAMUSCULAR | Status: DC
  Administered 2012-01-28 – 2012-02-03 (×11): 3 mL via INTRAVENOUS

## 2012-01-28 MED ORDER — SODIUM CHLORIDE 0.9 % IJ SOLN: 3.0000 mL | INTRAMUSCULAR | Status: DC | PRN

## 2012-01-28 MED ORDER — OXYBUTYNIN CHLORIDE 5 MG PO TABS
5.0000 mg | ORAL_TABLET | Freq: Every day | ORAL | Status: DC
  Administered 2012-01-28 – 2012-02-03 (×7): 5 mg via ORAL
  Filled 2012-01-28 (×7): qty 1

## 2012-01-28 MED ORDER — ZOLPIDEM TARTRATE 5 MG PO TABS: 10.0000 mg | ORAL_TABLET | Freq: Every day | ORAL | Status: DC

## 2012-01-28 MED ORDER — SODIUM CHLORIDE 0.9 % IV SOLN
250.0000 mL | INTRAVENOUS | Status: DC | PRN
  Administered 2012-01-28: 250 mL via INTRAVENOUS

## 2012-01-28 MED ORDER — ONDANSETRON HCL 4 MG/2ML IJ SOLN: 4.0000 mg | Freq: Four times a day (QID) | INTRAMUSCULAR | Status: DC | PRN

## 2012-01-28 MED ORDER — POTASSIUM CHLORIDE CRYS ER 20 MEQ PO TBCR
20.0000 meq | EXTENDED_RELEASE_TABLET | Freq: Two times a day (BID) | ORAL | Status: DC
  Administered 2012-01-28 – 2012-01-30 (×5): 20 meq via ORAL
  Filled 2012-01-28 (×7): qty 1

## 2012-01-28 MED ORDER — BUDESONIDE-FORMOTEROL FUMARATE 160-4.5 MCG/ACT IN AERO
2.0000 | INHALATION_SPRAY | Freq: Two times a day (BID) | RESPIRATORY_TRACT | Status: DC
  Administered 2012-01-28 – 2012-02-03 (×12): 2 via RESPIRATORY_TRACT
  Filled 2012-01-28: qty 6

## 2012-01-28 MED ORDER — ZOLPIDEM TARTRATE 5 MG PO TABS
5.0000 mg | ORAL_TABLET | Freq: Every evening | ORAL | Status: DC | PRN
  Administered 2012-01-28 – 2012-02-02 (×6): 5 mg via ORAL
  Filled 2012-01-28 (×6): qty 1

## 2012-01-28 MED ORDER — ALPRAZOLAM 0.25 MG PO TABS: 0.2500 mg | ORAL_TABLET | Freq: Two times a day (BID) | ORAL | Status: DC | PRN

## 2012-01-28 MED ORDER — TAMSULOSIN HCL 0.4 MG PO CAPS
0.4000 mg | ORAL_CAPSULE | Freq: Every day | ORAL | Status: DC
  Administered 2012-01-28 – 2012-02-02 (×6): 0.4 mg via ORAL
  Filled 2012-01-28 (×7): qty 1

## 2012-01-28 MED ORDER — DILTIAZEM HCL 100 MG IV SOLR
5.0000 mg/h | INTRAVENOUS | Status: DC
  Filled 2012-01-28: qty 100

## 2012-01-28 MED ORDER — HYDROCODONE-ACETAMINOPHEN 5-325 MG PO TABS
1.0000 | ORAL_TABLET | Freq: Four times a day (QID) | ORAL | Status: DC | PRN
  Administered 2012-01-28 – 2012-01-31 (×11): 2 via ORAL
  Administered 2012-02-01 (×2): 1 via ORAL
  Administered 2012-02-01 (×2): 2 via ORAL
  Administered 2012-02-02: 1 via ORAL
  Administered 2012-02-02: 2 via ORAL
  Filled 2012-01-28 (×11): qty 2
  Filled 2012-01-28: qty 1
  Filled 2012-01-28 (×5): qty 2

## 2012-01-28 MED ORDER — ACETAMINOPHEN 325 MG PO TABS: 650.0000 mg | ORAL_TABLET | ORAL | Status: DC | PRN

## 2012-01-28 NOTE — Assessment & Plan Note (Signed)
His weight appears to be a 24 pounds since I last saw him. Any IV diuresis. He needs to keep his feet elevated. He continued education.

## 2012-01-28 NOTE — Assessment & Plan Note (Addendum)
He is back in atrial flutter with 2:1 conduction.  I will admit him to the step down unit and treat him with IV Cardizem. Ultimately he will need flutter ablation. During this admission he'll likely need cardioversion.  Of note I called and got his INRs for Josue Hector, MD.  11/04/11  2.9, 12/02/11 2.3, 12/30/11 3.7, 01/20/12 2.7.

## 2012-01-28 NOTE — Progress Notes (Signed)
HPI The patient presents for followup of atrial flutter. As he walked into the office he was more dyspneic and he had been previously. Oxygen saturation was 85%. He has increased lower extremity swelling. EKG demonstrates atrial flutter with 21 conduction. He says he's been feeling this intermittently. Seems it's actually happening anytime he exerts himself he seems to be just minimal exertion. He's not describing presyncope or syncope. He's not describing chest pressure, neck or arm discomfort. He's not having PND or orthopnea. He is still very having recovered from orthopedic surgery and hemarthrosis earlier this year. He has been tolerating his anticoagulation. He's not been having any fevers or chills. He's not been having any cough.  No Known Allergies  Current Outpatient Prescriptions  Medication Sig Dispense Refill  . budesonide-formoterol (SYMBICORT) 160-4.5 MCG/ACT inhaler Inhale 2 puffs into the lungs 2 (two) times daily.       [name] diltiazem (CARDIZEM CD) 240 MG 24 hr capsule Take 240 mg by mouth daily.      [name] HYDROcodone-acetaminophen (VICODIN) 5-500 MG per tablet Take 1 tablet by mouth every 6 (six) hours as needed.      [name] omeprazole (PRILOSEC) 20 MG capsule Take 20 mg by mouth daily.      [name] oxybutynin (DITROPAN) 5 MG tablet Take 5 mg by mouth daily.        . Tamsulosin HCl (FLOMAX) 0.4 MG CAPS Take 0.4 mg by mouth at bedtime.        . torsemide (DEMADEX) 20 MG tablet Take 1/2 tab daily, as needed      . warfarin (COUMADIN) 5 MG tablet As directed      . zolpidem (AMBIEN) 10 MG tablet Take 10 mg by mouth at bedtime.         Past Medical History  Diagnosis Date  . CHF (congestive heart failure)   . COPD (chronic obstructive pulmonary disease)     Wert. PFTs 10/09/08 FEV1 1.39 (44%) ratio 40, DLCO 63%. 16% improvement after bronchodilator. overnight pulse ox 02/28/09 5:22m pent with sat< 89 sleeping > declined further o2 07/05/09. HFA 75% 04/23/09.   [name] Atrial flutter     echo 09/17/08  nl EF, mild LAE, RV mod dilated, pasystolic 46. noct desat 02/28/09, refused 02 @ hs   . Abnormal CT scan     SPN right mid zone. first detected 08/16/08; see [facility]   . Weight gain     target weight 196; BMI <30  . Lung nodule   . Hypertension     Past Surgical History  Procedure Date  . Hip replacement surgery     L   . Kyphosis surgery   . Back surgery     x4  . Knee surgery   . Cardioversion     in past  . Incision and drainage hip 09/24/2011    Procedure: IRRIGATION AND DEBRIDEMENT HIP;  Surgeon: Loanne Drilling, MD;  Location: [facility];  Service: Orthopedics;  Laterality: Left;    Family History  Problem Relation Age of Onset  . Cancer Neg Hx   . Diabetes Father     DM     History   Social History  . Marital Status: Married    Spouse Name: N/A    Number of Children: N/A  . Years of Education: N/A   Occupational History  . Not on file.   Social History Main Topics  . Smoking status: Former Smoker -- 0.5 packs/day for 20 years  Types: Cigarettes    Quit date: 07/28/2002  . Smokeless tobacco: Never Used  . Alcohol Use: No  . Drug Use: No  . Sexually Active: Not on file   Other Topics Concern  . Not on file   Social History Narrative   Separated; children; retired.    ROS:    PHYSICAL EXAM BP 140/70  Pulse 134  Ht 5\' 7"  (1.702 m)  Wt 240 lb (108.863 kg)  BMI 37.59 kg/m2GEN: No distress  NECK: No jugular venous distention at 90 degrees, waveform within normal limits, carotid upstroke brisk and symmetric, no bruits, no thyromegaly  LYMPHATICS: No cervical adenopathy  LUNGS: Clear to auscultation bilaterally  BACK: No CVA tenderness  CHEST: Unremarkable  HEART: S1 and S2 within normal limits, no S3, no S4, no clicks, no rubs, no murmurs  ABD: Positive bowel sounds normal in frequency in pitch, no bruits, no rebound, no guarding, unable to assess midline mass or bruit with the patient seated.  EXT: 2 plus pulses upper and diminished bilateral lower,  moderate edema, no cyanosis no clubbing  SKIN: No rashes no nodules  NEURO: Cranial nerves II through XII grossly intact, motor grossly intact throughout  PSYCH: Cognitively intact, oriented to person place and time  EKG:  Atrial flutter 2:1 conduction ventricular rate 142, right bundle branch block. 01/28/2012  ASSESSMENT AND PLAN

## 2012-01-28 NOTE — Progress Notes (Signed)
ANTICOAGULATION CONSULT NOTE - Initial Consult  Pharmacy Consult for Coumadin Indication: atrial fibrillation  No Known Allergies  Patient Measurements: Height: 5\' 7"  (170.2 cm) Weight: 236 lb 8.9 oz (107.3 kg) IBW/kg (Calculated) : 66.1    Vital Signs: Temp: 97.3 F (36.3 C) (07/03 1645) Temp src: Oral (07/03 1645) BP: 139/95 mmHg (07/03 1900) Pulse Rate: 84  (07/03 1900)  Labs:  Basename 01/28/12 2006 01/28/12 1725  HGB -- 13.7  HCT -- 44.0  PLT -- 163  APTT -- --  LABPROT 36.1* --  INR 3.56* --  HEPARINUNFRC -- --  CREATININE -- 0.60  CKTOTAL -- 52  CKMB -- 3.8  TROPONINI -- <0.30    Estimated Creatinine Clearance: 106.1 ml/min (by C-G formula based on Cr of 0.6).   Medical History: Past Medical History  Diagnosis Date  . COPD (chronic obstructive pulmonary disease)     Wert. PFTs 10/09/08 FEV1 1.39 (44%) ratio 40, DLCO 63%. 16% improvement after bronchodilator. overnight pulse ox 02/28/09 5:60m pent with sat< 89 sleeping > declined further o2 07/05/09. HFA 75% 04/23/09.   [name] Atrial flutter     echo 09/28/11 EF 50-55%, mild RAE, RV mildly dilated, systolic fxn mild-mod reduced  . Abnormal CT scan     SPN right mid zone. first detected 08/16/08; see CT [facility]   . Weight gain     target weight 196; BMI <30  . Lung nodule   . Hypertension   . Chronic diastolic CHF (congestive heart failure)      Assessment: 66yom with Hx A-Fib admitted increased SOB.  Compliant with coumadin 2.5mg  daily/5mg  Tu,Th as outpatient INR 3.36 on admit - stated 2.6 at MD office last week.  No bleeding noted, renal fx stable, 1st set cardiac enzymes negative.  Pt has already taken coumadin dose today.   Goal of Therapy:  INR 2-3 Monitor platelets by anticoagulation protocol: Yes   Plan:  Daily INR  Marcelino Scot 01/28/2012,9:13 PM

## 2012-01-28 NOTE — Patient Instructions (Signed)
PT TO BE ADMITTED FOR ACUTE ON CHRONIC CHF AND A FLUTTER - ADMITTING AT [place] NOTIFIED - PT WILL TRAVEL BY EMS - CARDMASTER MADE AWARE.

## 2012-01-29 DIAGNOSIS — I4892 Unspecified atrial flutter: Principal | ICD-10-CM

## 2012-01-29 DIAGNOSIS — I5033 Acute on chronic diastolic (congestive) heart failure: Secondary | ICD-10-CM

## 2012-01-29 LAB — CARDIAC PANEL(CRET KIN+CKTOT+MB+TROPI)
CK, MB: 3 ng/mL (ref 0.3–4.0)
Relative Index: INVALID (ref 0.0–2.5)
Troponin I: <0.3 ng/mL (ref <0.30)
Troponin I: <0.3 ng/mL (ref <0.30)

## 2012-01-29 LAB — BASIC METABOLIC PANEL
BUN: 10 mg/dL (ref 6–23)
Chloride: 103 mEq/L (ref 96–112)
Creatinine, Ser: 0.6 mg/dL (ref 0.50–1.35)
GFR calc Af Amer: >90 mL/min (ref >90)
GFR calc non Af Amer: >90 mL/min (ref >90)

## 2012-01-29 LAB — CBC
HCT: 42.5 % (ref 39.0–52.0)
MCHC: 31.1 g/dL (ref 30.0–36.0)
MCV: 89.3 fL (ref 78.0–100.0)
Platelets: 174 10*3/uL (ref 150–400)
RDW: 17.4 % — ABNORMAL HIGH (ref 11.5–15.5)
WBC: 4.7 10*3/uL (ref 4.0–10.5)

## 2012-01-29 MED ORDER — DILTIAZEM HCL 30 MG PO TABS
30.0000 mg | ORAL_TABLET | ORAL | Status: AC
  Administered 2012-01-29: 30 mg via ORAL
  Filled 2012-01-29: qty 1

## 2012-01-29 MED ORDER — DILTIAZEM HCL 30 MG PO TABS
30.0000 mg | ORAL_TABLET | Freq: Four times a day (QID) | ORAL | Status: DC
  Administered 2012-01-29 (×2): 30 mg via ORAL
  Filled 2012-01-29 (×4): qty 1

## 2012-01-29 MED ORDER — DILTIAZEM HCL 60 MG PO TABS
60.0000 mg | ORAL_TABLET | Freq: Four times a day (QID) | ORAL | Status: DC
  Administered 2012-01-30 – 2012-02-02 (×15): 60 mg via ORAL
  Filled 2012-01-29 (×18): qty 1

## 2012-01-29 NOTE — Progress Notes (Signed)
ANTICOAGULATION CONSULT NOTE - Follow Up Consult  Pharmacy Consult for Coumadin Indication: aflutter  No Known Allergies  Vital Signs: Temp: 98.4 F (36.9 C) (07/04 0700) Temp src: Oral (07/04 0700) BP: 127/78 mmHg (07/04 0700) Pulse Rate: 80  (07/04 0700)  Labs:  Alvira Philips 01/29/12 0850 01/29/12 0610 01/29/12 0041 01/28/12 2006 01/28/12 1725  HGB -- 13.2 -- -- 13.7  HCT -- 42.5 -- -- 44.0  PLT -- 174 -- -- 163  APTT -- -- -- -- --  LABPROT -- 41.1* -- 36.1* --  INR -- 4.20* -- 3.56* --  HEPARINUNFRC -- -- -- -- --  CREATININE -- 0.60 -- -- 0.60  CKTOTAL PENDING -- 43 -- 52  CKMB 3.1 -- 3.0 -- 3.8  TROPONINI <0.30 -- <0.30 -- <0.30    Estimated Creatinine Clearance: 104.4 ml/min (by C-G formula based on Cr of 0.6).  Assessment: 66yom on coumadin pta, admitted with a supratherapeutic INR and INR continues to rise.  No bleeding per chart notes. CBC stable. No identified drug interactions. Noted plan for possible aflutter ablation.  Goal of Therapy:  INR 2-3 Monitor platelets by anticoagulation protocol: Yes   Plan:  1) No coumadin tonight 2) Follow up INR in AM  Fredrik Rigger 01/29/2012,9:35 AM

## 2012-01-29 NOTE — Progress Notes (Signed)
SUBJECTIVE:  Breathing better.  Rate improved.  No cough.  Chronic back pain.   PHYSICAL EXAM Filed Vitals:   01/29/12 0300 01/29/12 0400 01/29/12 0500 01/29/12 0700  BP: 119/73 123/60  127/78  Pulse: 55 83  80  Temp: 98.6 F (37 C)     TempSrc: Oral     Resp: 8 11  12   Height:      Weight:   229 lb 8 oz (104.1 kg)   SpO2: 98% 93%  97%   General:  No distress Lungs:  Clear Heart:  RRR (flutter) Abdomen:  Positive bowel sounds, no rebound no guarding Extremities:  Decreased edema  LABS: Lab Results  Component Value Date   CKTOTAL 43 01/29/2012   CKMB 3.0 01/29/2012   TROPONINI <0.30 01/29/2012   Results for orders placed during the hospital encounter of 01/28/12 (from the past 24 hour(s))  MRSA PCR SCREENING     Status: Normal   Collection Time   01/28/12  4:40 PM      Component Value Range   MRSA by PCR NEGATIVE  NEGATIVE  BASIC METABOLIC PANEL     Status: Abnormal   Collection Time   01/28/12  5:25 PM      Component Value Range   Sodium 145  135 - 145 mEq/L   Potassium 3.7  3.5 - 5.1 mEq/L   Chloride 103  96 - 112 mEq/L   CO2 29  19 - 32 mEq/L   Glucose, Bld 105 (*) 70 - 99 mg/dL   BUN 12  6 - 23 mg/dL   Creatinine, Ser 4.09  0.50 - 1.35 mg/dL   Calcium 9.0  8.4 - 81.1 mg/dL   GFR calc non Af Amer >90  >90 mL/min   GFR calc Af Amer >90  >90 mL/min  TSH     Status: Normal   Collection Time   01/28/12  5:25 PM      Component Value Range   TSH 1.160  0.350 - 4.500 uIU/mL  CARDIAC PANEL(CRET KIN+CKTOT+MB+TROPI)     Status: Normal   Collection Time   01/28/12  5:25 PM      Component Value Range   Total CK 52  7 - 232 U/L   CK, MB 3.8  0.3 - 4.0 ng/mL   Troponin I <0.30  <0.30 ng/mL   Relative Index RELATIVE INDEX IS INVALID  0.0 - 2.5  CBC     Status: Abnormal   Collection Time   01/28/12  5:25 PM      Component Value Range   WBC 5.9  4.0 - 10.5 K/uL   RBC 4.87  4.22 - 5.81 MIL/uL   Hemoglobin 13.7  13.0 - 17.0 g/dL   HCT 91.4  78.2 - 95.6 %   MCV 90.3  78.0 -  100.0 fL   MCH 28.1  26.0 - 34.0 pg   MCHC 31.1  30.0 - 36.0 g/dL   RDW 21.3 (*) 08.6 - 57.8 %   Platelets 163  150 - 400 K/uL  PROTIME-INR     Status: Abnormal   Collection Time   01/28/12  8:06 PM      Component Value Range   Prothrombin Time 36.1 (*) 11.6 - 15.2 seconds   INR 3.56 (*) 0.00 - 1.49  CARDIAC PANEL(CRET KIN+CKTOT+MB+TROPI)     Status: Normal   Collection Time   01/29/12 12:41 AM      Component Value Range   Total CK 43  7 - 232 U/L   CK, MB 3.0  0.3 - 4.0 ng/mL   Troponin I <0.30  <0.30 ng/mL   Relative Index RELATIVE INDEX IS INVALID  0.0 - 2.5  CBC     Status: Abnormal   Collection Time   01/29/12  6:10 AM      Component Value Range   WBC 4.7  4.0 - 10.5 K/uL   RBC 4.76  4.22 - 5.81 MIL/uL   Hemoglobin 13.2  13.0 - 17.0 g/dL   HCT 41.3  24.4 - 01.0 %   MCV 89.3  78.0 - 100.0 fL   MCH 27.7  26.0 - 34.0 pg   MCHC 31.1  30.0 - 36.0 g/dL   RDW 27.2 (*) 53.6 - 64.4 %   Platelets 174  150 - 400 K/uL  BASIC METABOLIC PANEL     Status: Abnormal   Collection Time   01/29/12  6:10 AM      Component Value Range   Sodium 143  135 - 145 mEq/L   Potassium 3.9  3.5 - 5.1 mEq/L   Chloride 103  96 - 112 mEq/L   CO2 30  19 - 32 mEq/L   Glucose, Bld 103 (*) 70 - 99 mg/dL   BUN 10  6 - 23 mg/dL   Creatinine, Ser 0.34  0.50 - 1.35 mg/dL   Calcium 9.0  8.4 - 74.2 mg/dL   GFR calc non Af Amer >90  >90 mL/min   GFR calc Af Amer >90  >90 mL/min  PROTIME-INR     Status: Abnormal   Collection Time   01/29/12  6:10 AM      Component Value Range   Prothrombin Time 41.1 (*) 11.6 - 15.2 seconds   INR 4.20 (*) 0.00 - 1.49    Intake/Output Summary (Last 24 hours) at 01/29/12 5956 Last data filed at 01/28/12 2300  Gross per 24 hour  Intake     14 ml  Output   4100 ml  Net  -4086 ml    ASSESSMENT AND PLAN:  Atrial flutter:  Still in flutter (looks more atypical on telemetry).  Rate improved.  Warfarin per pharmacy.  I would like for EP to see him and consider  ablation.  Diastolic heart failure:  Continue IV diuresis.  Educate.  He does not watch salt/fluid or weight himself.  I will restart PO Diltiazem.    Fayrene Fearing [facility] 01/29/2012 7:42 AM

## 2012-01-29 NOTE — Care Management Note (Signed)
    Page 1 of 1   01/29/2012     8:38:14 AM   CARE MANAGEMENT NOTE 01/29/2012  Patient:  Francisco Tanner, Francisco Tanner   Account Number:  [id number]  Date Initiated:  01/29/2012  Documentation initiated by:  Avie Arenas  Subjective/Objective Assessment:   CHF -  Lives Alone     Action/Plan:   Anticipated DC Date:  02/02/2012   Anticipated DC Plan:  HOME W HOME HEALTH SERVICES      DC Planning Services  CM consult      Choice offered to / List presented to:          [facility] arranged  HH-1 RN  HH-10 DISEASE MANAGEMENT      HH agency  [facility]   Status of service:  In process, will continue to follow Medicare Important Message given?   (If response is "NO", the following Medicare IM given date fields will be blank) Date Medicare IM given:   Date Additional Medicare IM given:    Discharge Disposition:    Per UR Regulation:  Reviewed for med. necessity/level of care/duration of stay  If discussed at Long Length of Stay Meetings, dates discussed:    Comments:

## 2012-01-29 NOTE — Progress Notes (Signed)
Paged MD about cardzem 120mg  vs 240mg  home dose and HR in the 120s and 130s. MD to start Pt on home dose cardzem

## 2012-01-30 LAB — CBC
HCT: 46 % (ref 39.0–52.0)
Hemoglobin: 14.8 g/dL (ref 13.0–17.0)
MCV: 87.6 fL (ref 78.0–100.0)
RBC: 5.25 MIL/uL (ref 4.22–5.81)
RDW: 17.3 % — ABNORMAL HIGH (ref 11.5–15.5)
WBC: 5.9 10*3/uL (ref 4.0–10.5)

## 2012-01-30 LAB — BASIC METABOLIC PANEL
CO2: 28 mEq/L (ref 19–32)
Chloride: 99 mEq/L (ref 96–112)
GFR calc Af Amer: >90 mL/min (ref >90)
Potassium: 3.9 mEq/L (ref 3.5–5.1)
Sodium: 141 mEq/L (ref 135–145)

## 2012-01-30 LAB — PROTIME-INR: INR: 2.58 — ABNORMAL HIGH (ref 0.00–1.49)

## 2012-01-30 MED ORDER — WARFARIN - PHARMACIST DOSING INPATIENT: Freq: Every day | Status: DC

## 2012-01-30 MED ORDER — WARFARIN SODIUM 2.5 MG PO TABS
2.5000 mg | ORAL_TABLET | Freq: Once | ORAL | Status: AC
  Administered 2012-01-30: 2.5 mg via ORAL
  Filled 2012-01-30: qty 1

## 2012-01-30 MED ORDER — FUROSEMIDE 40 MG PO TABS
40.0000 mg | ORAL_TABLET | Freq: Two times a day (BID) | ORAL | Status: DC
  Administered 2012-01-30 – 2012-01-31 (×3): 40 mg via ORAL
  Filled 2012-01-30 (×5): qty 1

## 2012-01-30 NOTE — Progress Notes (Signed)
Pt heart rate up to 130s sustaining. In to assess pt and noted standing beside bed just stretching legs. Assisted pt back to bed and assessed vitals, noted 97.1T, 134HR, 142/72BP, 20R, 95% O2 Sats on room air. Pt denies chest pain and no shortness of breath noted. MD on call notified and order to give scheduled 0000 Cardizem now and EKG in the am. Will continue to monitor.

## 2012-01-30 NOTE — Progress Notes (Signed)
UR Completed. Reha Martinovich, RN, Nurse Case Manager [phone]     

## 2012-01-30 NOTE — Clinical Documentation Improvement (Signed)
GENERIC DOCUMENTATION CLARIFICATION QUERY  THIS DOCUMENT IS NOT A PERMANENT PART OF THE MEDICAL RECORD  TO RESPOND TO THE THIS QUERY, FOLLOW THE INSTRUCTIONS BELOW:  1. If needed, update documentation for the patient's encounter via the notes activity.  2. Access this query again and click edit on the In [employer].  3. After updating, or not, click F2 to complete all highlighted (required) fields concerning your review. Select "additional documentation in the medical record" OR "no additional documentation provided".  4. Click Sign note button.  5. The deficiency will fall out of your In Basket *Please let us know if you are not able to complete this workflow by phone or e-mail (listed below).  Please update your documentation within the medical record to reflect your response to this query.                                                                                        01/30/12   Dear Dr. Antoine Poche / Associates,  In a better effort to capture your patient's severity of illness, reflect appropriate length of stay and utilization of resources, a review of the patient medical record has revealed the following indicators.  THANK YOU FOR CLARIFYING ACUITY OF "DIASTOLIC CHF".   Possible Clinical Conditions? - Acute Diastolic CHF - Acute on Chronic Diastolic CHF - Other Condition (please specify) - Cannot Clinically Determine  Supporting Information: - Risk Factors: Aflutter w/2:1 condution, per PN 7/4:"doesn't watch salt/fluid or weigh himself", hx CHF - Signs/Sx: per 7/3 Assessment: "His weight appears to be up 24 pounds since I last saw him." 7/3:Oxygen saturation was 85%, more dyspneic walking to office, increased LE swelling. - Treatment: 7/3: Initiate IV diureses, 7/4 PN: Continue IV diuresis, 7/5 change to PO diureses   You may use possible, probable, or suspect with inpatient documentation. possible, probable, suspected diagnoses MUST be documented at the time of  discharge  Reviewed: additional documentation in the medical record  Thank You,  Beverley Fiedler RN Clinical Documentation Specialist: Pager:  [phone] Health Information Management:  [phone] [facility]

## 2012-01-30 NOTE — Progress Notes (Signed)
ANTICOAGULATION CONSULT NOTE - Follow Up Consult  Pharmacy Consult for Coumadin Indication: aflutter  No Known Allergies  Vital Signs: Temp: 97.5 F (36.4 C) (07/05 0540) Temp src: Oral (07/05 0540) BP: 123/71 mmHg (07/05 0540) Pulse Rate: 95  (07/05 0700)  Labs:  Alvira Philips 01/30/12 0518 01/29/12 0850 01/29/12 0610 01/29/12 0041 01/28/12 2006 01/28/12 1725  HGB 14.8 -- 13.2 -- -- --  HCT 46.0 -- 42.5 -- -- 44.0  PLT 193 -- 174 -- -- 163  APTT -- -- -- -- -- --  LABPROT 28.1* -- 41.1* -- 36.1* --  INR 2.58* -- 4.20* -- 3.56* --  HEPARINUNFRC -- -- -- -- -- --  CREATININE 0.66 -- 0.60 -- -- 0.60  CKTOTAL -- 50 -- 43 -- 52  CKMB -- 3.1 -- 3.0 -- 3.8  TROPONINI -- <0.30 -- <0.30 -- <0.30    Estimated Creatinine Clearance: 101.9 ml/min (by C-G formula based on Cr of 0.66).  Assessment: 66yom on coumadin pta, admitted with a supratherapeutic INR. INR back to therapeutic range today. It took a big drop today.   Goal of Therapy:  INR 2-3 Monitor platelets by anticoagulation protocol: Yes   Plan:  1) Coumadin 2.5mg  PO x1 2) Follow up INR in AM  [place]roke, Keinan Brouillet Beaver 01/30/2012,9:59 AM

## 2012-01-30 NOTE — Progress Notes (Addendum)
   SUBJECTIVE:  Breathing better and he thinks it is at baseline. No cough.  Chronic back pain.   PHYSICAL EXAM Filed Vitals:   01/29/12 1505 01/29/12 2130 01/30/12 0249 01/30/12 0540  BP: 139/77 140/66 142/78 123/71  Pulse: 129 112 102 101  Temp: 97.2 F (36.2 C) 98.3 F (36.8 C) 98.4 F (36.9 C) 97.5 F (36.4 C)  TempSrc: Oral Oral Oral Oral  Resp: 18 18 18 18   Height: 5\' 7"  (1.702 m)     Weight: 221 lb 12.8 oz (100.608 kg)   218 lb 11.1 oz (99.2 kg)  SpO2: 98% 98% 98% 96%   General:  No distress Lungs:  Clear Heart:  Irregular Abdomen:  Positive bowel sounds, no rebound no guarding Extremities:  Decreased edema  LABS: Lab Results  Component Value Date   CKTOTAL 50 01/29/2012   CKMB 3.1 01/29/2012   TROPONINI <0.30 01/29/2012   Results for orders placed during the hospital encounter of 01/28/12 (from the past 24 hour(s))  CARDIAC PANEL(CRET KIN+CKTOT+MB+TROPI)     Status: Normal   Collection Time   01/29/12  8:50 AM      Component Value Range   Total CK 50  7 - 232 U/L   CK, MB 3.1  0.3 - 4.0 ng/mL   Troponin I <0.30  <0.30 ng/mL   Relative Index RELATIVE INDEX IS INVALID  0.0 - 2.5  CBC     Status: Abnormal   Collection Time   01/30/12  5:18 AM      Component Value Range   WBC 5.9  4.0 - 10.5 K/uL   RBC 5.25  4.22 - 5.81 MIL/uL   Hemoglobin 14.8  13.0 - 17.0 g/dL   HCT 08.6  57.8 - 46.9 %   MCV 87.6  78.0 - 100.0 fL   MCH 28.2  26.0 - 34.0 pg   MCHC 32.2  30.0 - 36.0 g/dL   RDW 62.9 (*) 52.8 - 41.3 %   Platelets 193  150 - 400 K/uL  PROTIME-INR     Status: Abnormal   Collection Time   01/30/12  5:18 AM      Component Value Range   Prothrombin Time 28.1 (*) 11.6 - 15.2 seconds   INR 2.58 (*) 0.00 - 1.49    Intake/Output Summary (Last 24 hours) at 01/30/12 0724 Last data filed at 01/30/12 0600  Gross per 24 hour  Intake    614 ml  Output   5000 ml  Net  -4386 ml    ASSESSMENT AND PLAN:  Atrial flutter:  Flutter with higher rate yesterday.  Cardizem PO  increased.   Warfarin per pharmacy.  I would like for EP to see him and consider ablation.  I have talked to Dr. Ladona Ridgel  Acute on chronic diastolic heart failure:  Change to PO diuresis.  Educate.  He does not watch salt/fluid or weight himself.   Fayrene Fearing [facility] 01/30/2012 7:24 AM

## 2012-01-30 NOTE — Progress Notes (Signed)
Pt short of B give 800 symbicrt. Pt stat 94%. Pt ling sounds unchanged from 7-4. Pt said he feels like it is nasal drainage. Well continue to monitor

## 2012-01-31 LAB — CBC
Hemoglobin: 15.1 g/dL (ref 13.0–17.0)
MCV: 88.8 fL (ref 78.0–100.0)
Platelets: 182 10*3/uL (ref 150–400)
RBC: 5.37 MIL/uL (ref 4.22–5.81)
WBC: 6.8 10*3/uL (ref 4.0–10.5)

## 2012-01-31 LAB — BASIC METABOLIC PANEL
CO2: 30 mEq/L (ref 19–32)
Calcium: 9.4 mg/dL (ref 8.4–10.5)
Chloride: 96 mEq/L (ref 96–112)
Potassium: 4.1 mEq/L (ref 3.5–5.1)
Sodium: 139 mEq/L (ref 135–145)

## 2012-01-31 MED ORDER — POTASSIUM CHLORIDE CRYS ER 20 MEQ PO TBCR
20.0000 meq | EXTENDED_RELEASE_TABLET | Freq: Three times a day (TID) | ORAL | Status: DC
  Administered 2012-01-31 – 2012-02-01 (×2): 20 meq via ORAL
  Filled 2012-01-31 (×3): qty 1

## 2012-01-31 MED ORDER — WARFARIN SODIUM 5 MG PO TABS
5.0000 mg | ORAL_TABLET | Freq: Once | ORAL | Status: AC
  Administered 2012-01-31: 5 mg via ORAL
  Filled 2012-01-31: qty 1

## 2012-01-31 MED ORDER — FUROSEMIDE 40 MG PO TABS
40.0000 mg | ORAL_TABLET | Freq: Three times a day (TID) | ORAL | Status: DC
  Administered 2012-01-31 – 2012-02-03 (×8): 40 mg via ORAL
  Filled 2012-01-31 (×11): qty 1

## 2012-01-31 NOTE — Progress Notes (Signed)
ANTICOAGULATION CONSULT NOTE - Follow Up Consult  Pharmacy Consult for Coumadin Indication: aflutter  No Known Allergies  Vital Signs: Temp: 98.2 F (36.8 C) (07/06 0521) Temp src: Oral (07/06 0521) BP: 115/78 mmHg (07/06 0521) Pulse Rate: 93  (07/06 0521)  Labs:  Alvira Philips 01/31/12 0650 01/30/12 0518 01/29/12 0850 01/29/12 0610 01/29/12 0041 01/28/12 1725  HGB 15.1 14.8 -- -- -- --  HCT 47.7 46.0 -- 42.5 -- --  PLT 182 193 -- 174 -- --  APTT -- -- -- -- -- --  LABPROT 23.4* 28.1* -- 41.1* -- --  INR 2.04* 2.58* -- 4.20* -- --  HEPARINUNFRC -- -- -- -- -- --  CREATININE 0.66 0.66 -- 0.60 -- --  CKTOTAL -- -- 50 -- 43 52  CKMB -- -- 3.1 -- 3.0 3.8  TROPONINI -- -- <0.30 -- <0.30 <0.30    Estimated Creatinine Clearance: 101.4 ml/min (by C-G formula based on Cr of 0.66).  Assessment: 66yom on coumadin pta, admitted with a supratherapeutic INR. INR still dropping today but still in therapeutic range. Will give higher dose today since plan for cardioversion tomorrow.    Goal of Therapy:  INR 2-3 Monitor platelets by anticoagulation protocol: Yes   Plan:  1) Coumadin 5mg  PO x1 2) Follow up INR in AM  [place], Kaylana Fenstermacher [place] 01/31/2012,12:20 PM

## 2012-01-31 NOTE — Progress Notes (Signed)
Patient Name: Francisco Tanner      SUBJECTIVE: Admitted congestive heart failure the context of atrial flutter with 2:1 conduction. He previously undergone TE cardioversion. There is a note in the old charts concerning atrial fibrillation but all ECGs at home were reviewed and no atrial fibrillation is identified.  He has undergone cardioversion on 2 prior occasions.  He has a history of pulmonary embolism prior infarcts. He continues to have  tachypalpitations and  Tachycardia;  shortness of breath is somewhat improved. Echocardiogram March 2013 demonstrated normal left ventricular function a dilated and dysfunctional RV   He also has a history of significant back issues.  Past Medical History  Diagnosis Date  . COPD (chronic obstructive pulmonary disease)     Wert. PFTs 10/09/08 FEV1 1.39 (44%) ratio 40, DLCO 63%. 16% improvement after bronchodilator. overnight pulse ox 02/28/09 5:35m pent with sat< 89 sleeping > declined further o2 07/05/09. HFA 75% 04/23/09.   [name] Atrial flutter     echo 09/28/11 EF 50-55%, mild RAE, RV mildly dilated, systolic fxn mild-mod reduced  . Abnormal CT scan     SPN right mid zone. first detected 08/16/08; see [facility]   . Weight gain     target weight 196; BMI <30  . Lung nodule   . Hypertension   . Chronic diastolic CHF (congestive heart failure)     PHYSICAL EXAM Filed Vitals:   01/30/12 2049 01/30/12 2300 01/31/12 0240 01/31/12 0521  BP: 123/65 142/72 106/78 115/78  Pulse: 86 134 93 93  Temp: 98 F (36.7 C) 97.1 F (36.2 C) 98.4 F (36.9 C) 98.2 F (36.8 C)  TempSrc: Oral Oral Oral Oral  Resp: 18 20 20 20   Height:      Weight:    216 lb 0.8 oz (98 kg)  SpO2: 97% 95% 96% 97%    Well developed and nourished in no acute distress HENT normal Neck supple with JVP .10 Crackles  Fast and irregular rate Abd-soft with active BS No Clubbing cyanosis 1+ edema Skin-warm and dry A & Oriented  Grossly normal sensory and motor function   TELEMETRY:  Reviewed telemetry pt in atrial flutter with variable rates:    Intake/Output Summary (Last 24 hours) at 01/31/12 1029 Last data filed at 01/31/12 0758  Gross per 24 hour  Intake    640 ml  Output   2478 ml  Net  -1838 ml    LABS: Basic Metabolic Panel:  Lab 01/31/12 1610 01/30/12 0518 01/29/12 0610 01/28/12 1725  NA 139 141 143 145  K 4.1 3.9 3.9 3.7  CL 96 99 103 103  CO2 30 28 30 29   GLUCOSE 105* 111* 103* 105*  BUN 16 14 10 12   CREATININE 0.66 0.66 0.60 0.60  CALCIUM 9.4 9.5 -- --  MG -- -- -- --  PHOS -- -- -- --   Cardiac Enzymes:  Basename 01/29/12 0850 01/29/12 0041 01/28/12 1725  CKTOTAL 50 43 52  CKMB 3.1 3.0 3.8  CKMBINDEX -- -- --  TROPONINI <0.30 <0.30 <0.30   CBC:  Lab 01/31/12 0650 01/30/12 0518 01/29/12 0610 01/28/12 1725  WBC 6.8 5.9 4.7 5.9  NEUTROABS -- -- -- --  HGB 15.1 14.8 13.2 13.7  HCT 47.7 46.0 42.5 44.0  MCV 88.8 87.6 89.3 90.3  PLT 182 193 174 163   PROTIME:  Basename 01/31/12 0650 01/30/12 0518 01/29/12 0610  LABPROT 23.4* 28.1* 41.1*  INR 2.04* 2.58* 4.20*     Basename 01/28/12 1725  TSH 1.160  T4TOTAL --  T3FREE --  THYROIDAB --      ASSESSMENT AND PLAN:  Patient Active Hospital Problem List: Acute on chronic diastolic heart failure (02/19/2011)   PULMONARY HYPERTENSION, SECONDARY (10/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  Still with AFl RVR  Will proceed with RFCA on Mon if anesthesia available ; although-wise we will cardiovert tomorrow and plan outpatient anesthesia assisted ablation in 3-4 weeks. His back issues preclude the we will anesthesia support.  I have reviewed with him the potential risks and benefits of flutter ablation. He is agreeable and willing to proceed.  Signed, Sherryl Manges MD  01/31/2012

## 2012-02-01 LAB — CBC
HCT: 48.2 % (ref 39.0–52.0)
Hemoglobin: 15.4 g/dL (ref 13.0–17.0)
MCH: 28 pg (ref 26.0–34.0)
MCV: 87.6 fL (ref 78.0–100.0)
Platelets: 184 10*3/uL (ref 150–400)
RBC: 5.5 MIL/uL (ref 4.22–5.81)
WBC: 6.2 10*3/uL (ref 4.0–10.5)

## 2012-02-01 LAB — HEPARIN LEVEL (UNFRACTIONATED): Heparin Unfractionated: 0.43 IU/mL (ref 0.30–0.70)

## 2012-02-01 MED ORDER — HEPARIN (PORCINE) IN NACL 100-0.45 UNIT/ML-% IJ SOLN
1300.0000 [IU]/h | INTRAMUSCULAR | Status: DC
  Administered 2012-02-01 – 2012-02-02 (×2): 1500 [IU]/h via INTRAVENOUS
  Filled 2012-02-01 (×3): qty 250

## 2012-02-01 MED ORDER — WARFARIN SODIUM 5 MG PO TABS
5.0000 mg | ORAL_TABLET | Freq: Once | ORAL | Status: AC
  Administered 2012-02-01: 5 mg via ORAL
  Filled 2012-02-01: qty 1

## 2012-02-01 MED ORDER — POTASSIUM CHLORIDE 20 MEQ/15ML (10%) PO LIQD
20.0000 meq | Freq: Three times a day (TID) | ORAL | Status: DC
  Administered 2012-02-01 – 2012-02-03 (×5): 20 meq via ORAL
  Filled 2012-02-01 (×8): qty 15

## 2012-02-01 MED ORDER — SODIUM CHLORIDE 0.9 % IV SOLN
INTRAVENOUS | Status: DC
  Administered 2012-02-02: 05:00:00 via INTRAVENOUS

## 2012-02-01 NOTE — Progress Notes (Signed)
ANTICOAGULATION CONSULT NOTE - Follow Up Consult  Pharmacy Consult for Coumadin and Heparin  Indication: aflutter/ hx of PE  No Known Allergies  Vital Signs: Temp: 97.5 F (36.4 C) (07/07 0523) Temp src: Oral (07/07 0523) BP: 115/45 mmHg (07/07 0523) Pulse Rate: 101  (07/07 0523)  Labs:  Basename 02/01/12 0540 01/31/12 0650 01/30/12 0518  HGB 15.4 15.1 --  HCT 48.2 47.7 46.0  PLT 184 182 193  APTT -- -- --  LABPROT 22.8* 23.4* 28.1*  INR 1.97* 2.04* 2.58*  HEPARINUNFRC -- -- --  CREATININE -- 0.66 0.66  CKTOTAL -- -- --  CKMB -- -- --  TROPONINI -- -- --    Estimated Creatinine Clearance: 100.9 ml/min (by C-G formula based on Cr of 0.66).  Assessment: 66yo m on coumadin for aflutter and hx of PE.  INR now decreased to 1.97.  MD asked Pharmacy to start heparin, RFCA tomorrow.   Goal of Therapy:  INR 2-3 Heparin level= 0.3-0.7 Monitor platelets by anticoagulation protocol: Yes   Plan:  1) Coumadin 5mg  PO x1 2) Start heparin IV drip at 1500 units/hr. 3) Check heparin level 6 hours after drip start.  4) Daily heparin level/CBC, Daily INR.   Marylouise Stacks 02/01/2012,12:02 PM

## 2012-02-01 NOTE — Progress Notes (Signed)
ANTICOAGULATION CONSULT NOTE - Follow Up Consult  Pharmacy Consult for Heparin Indication: A-flutter, History of PE  Heparin Dosing Weight: 66 kg  Vital Signs: Temp: 97.4 F (36.3 C) (07/07 1352) Temp src: Oral (07/07 1352) BP: 114/76 mmHg (07/07 1352) Pulse Rate: 88  (07/07 1352)  Labs:  Basename 02/01/12 1822 02/01/12 0540 01/31/12 0650 01/30/12 0518  HGB -- 15.4 15.1 --  HCT -- 48.2 47.7 46.0  PLT -- 184 182 193  APTT -- -- -- --  LABPROT -- 22.8* 23.4* 28.1*  INR -- 1.97* 2.04* 2.58*  HEPARINUNFRC 0.43 -- -- --  CREATININE -- -- 0.66 0.66  CKTOTAL -- -- -- --  CKMB -- -- -- --  TROPONINI -- -- -- --    Estimated Creatinine Clearance: 100.9 ml/min (by C-G formula based on Cr of 0.66).   Medications:  Infusions:    . sodium chloride    . heparin 1,500 Units/hr (02/01/12 1348)    Assessment:  66 y/o male on chronic Coumadin currently SUBtherapeutic.  Patient is on Heparin Bridging.  Heparin level reported as 0.43, within the therapeutic range of 0.3 - 0.7.  Plan:   Continue Heparin at current rate. Daily Heparin level and CBC while on Heparin.   Wilmina Maxham, Deetta Perla.D 02/01/2012,8:00 PM

## 2012-02-01 NOTE — Progress Notes (Signed)
  Echocardiogram 2D Echocardiogram has been performed.  Francisco Tanner 02/01/2012, 8:56 AM

## 2012-02-01 NOTE — Plan of Care (Signed)
Problem: Phase I Progression Outcomes Goal: EF % per last Echo/documented,Core Reminder form on chart Outcome: Completed/Met Date Met:  02/01/12 Per last echo 09/28/11, EF 50-55%

## 2012-02-01 NOTE — Progress Notes (Signed)
Patient Name: Francisco Tanner      SUBJECTIVE: Admitted congestive heart failure the context of atrial flutter with 2:1 conduction. He previously undergone TE cardioversion. There is a note in the old charts concerning atrial fibrillation but all ECGs at home were reviewed and no atrial fibrillation is identified.  He has undergone cardioversion on 2 prior occasions.  He has a history of pulmonary embolism prior infarcts. And  has a history of significant back issues.   Echocardiogram March 2013 demonstrated normal left ventricular function a dilated and dysfunctional RV  The patient denies chest pain, shortness of breath, nocturnal dyspnea, orthopnea or peripheral edema  Feels better today  Past Medical History  Diagnosis Date  . COPD (chronic obstructive pulmonary disease)     Wert. PFTs 10/09/08 FEV1 1.39 (44%) ratio 40, DLCO 63%. 16% improvement after bronchodilator. overnight pulse ox 02/28/09 5:21m pent with sat< 89 sleeping > declined further o2 07/05/09. HFA 75% 04/23/09.   . Atrial flutter     echo 09/28/11 EF 50-55%, mild RAE, RV mildly dilated, systolic fxn mild-mod reduced  . Abnormal CT scan     SPN right mid zone. first detected 08/16/08; see CT [facility]   . Weight gain     target weight 196; BMI <30  . Lung nodule   . Hypertension   . Chronic diastolic CHF (congestive heart failure)     PHYSICAL EXAM Filed Vitals:   01/31/12 2225 02/01/12 0222 02/01/12 0523 02/01/12 0747  BP: 127/90 119/83 115/45   Pulse: 94 50 101   Temp: 97.9 F (36.6 C) 97.7 F (36.5 C) 97.5 F (36.4 C)   TempSrc: Oral Oral Oral   Resp: 18 20 18   Height:      Weight:   213 lb 13.5 oz (97 kg)   SpO2: 95% 91% 94% 96%    Well developed and nourished in no acute distress HENT normal Neck supple with JVP .10 Crackles  Fast and irregular rate Abd-soft with active BS No Clubbing cyanosis 1+ edema Skin-warm and dry A & Oriented  Grossly normal sensory and motor function   TELEMETRY: Reviewed  telemetry pt in atrial flutter with variable rates:    Intake/Output Summary (Last 24 hours) at 02/01/12 1008 Last data filed at 02/01/12 0900  Gross per 24 hour  Intake    800 ml  Output   3050 ml  Net  -2250 ml    LABS: Basic Metabolic Panel:  Lab 01/31/12 0650 01/30/12 0518 01/29/12 0610 01/28/12 1725  NA 139 141 143 145  K 4.1 3.9 3.9 3.7  CL 96 99 103 103  CO2 30 28 30 29  GLUCOSE 105* 111* 103* 105*  BUN 16 14 10 12  CREATININE 0.66 0.66 0.60 0.60  CALCIUM 9.4 9.5 -- --  MG -- -- -- --  PHOS -- -- -- --   Cardiac Enzymes: No results found for this basename: CKTOTAL:3,CKMB:3,CKMBINDEX:3,TROPONINI:3 in the last 72 hours CBC:  Lab 02/01/12 0540 01/31/12 0650 01/30/12 0518 01/29/12 0610 01/28/12 1725  WBC 6.2 6.8 5.9 4.7 5.9  NEUTROABS -- -- -- -- --  HGB 15.4 15.1 14.8 13.2 13.7  HCT 48.2 47.7 46.0 42.5 44.0  MCV 87.6 88.8 87.6 89.3 90.3  PLT 184 182 193 174 163   PROTIME:  Basename 02/01/12 0540 01/31/12 0650 01/30/12 0518  LABPROT 22.8* 23.4* 28.1*  INR 1.97* 2.04* 2.58*    No results found for this basename: TSH,T4TOTAL,FREET3,T3FREE,THYROIDAB in the last 72 hours      ASSESSMENT AND PLAN:  Patient Active Hospital Problem List: Acute on chronic diastolic heart failure (02/19/2011)   PULMONARY HYPERTENSION, SECONDARY (10/09/2008)   COPD UNSPECIFIED (09/08/2008)   History of pulmonary embolism (01/28/2012)   Atrial flutter with rapid ventricular response (01/28/2012)   Cellulitis (01/28/2012)  * INR subtherapeutic will begin heparin Plan RFCA in am If bumped by neuroradiology will do DCCV and reschedule    Signed, Cayle Cordoba MD  02/01/2012   

## 2012-02-02 ENCOUNTER — Encounter (HOSPITAL_COMMUNITY)
Admission: AD | Disposition: A | Discharge status: Home / Self Care | Payer: Self-pay | Source: Ambulatory Visit | Attending: Cardiology

## 2012-02-02 ENCOUNTER — Encounter (HOSPITAL_COMMUNITY): Payer: Self-pay | Admitting: Anesthesiology

## 2012-02-02 ENCOUNTER — Inpatient Hospital Stay (HOSPITAL_COMMUNITY): Payer: Medicare Other | Admitting: Anesthesiology

## 2012-02-02 DIAGNOSIS — I4892 Unspecified atrial flutter: Secondary | ICD-10-CM

## 2012-02-02 HISTORY — PX: A FLUTTER ABLATION: SHX5348

## 2012-02-02 LAB — BASIC METABOLIC PANEL
BUN: 24 mg/dL — ABNORMAL HIGH (ref 6–23)
CO2: 29 mEq/L (ref 19–32)
Chloride: 96 mEq/L (ref 96–112)
Creatinine, Ser: 0.81 mg/dL (ref 0.50–1.35)
GFR calc Af Amer: >90 mL/min (ref >90)
Potassium: 4.7 mEq/L (ref 3.5–5.1)

## 2012-02-02 LAB — MAGNESIUM: Magnesium: 2.1 mg/dL (ref 1.5–2.5)

## 2012-02-02 LAB — CBC
HCT: 48.1 % (ref 39.0–52.0)
MCV: 87.5 fL (ref 78.0–100.0)
RBC: 5.5 MIL/uL (ref 4.22–5.81)
RDW: 17.2 % — ABNORMAL HIGH (ref 11.5–15.5)
WBC: 6 10*3/uL (ref 4.0–10.5)

## 2012-02-02 LAB — PROTIME-INR: Prothrombin Time: 29 seconds — ABNORMAL HIGH (ref 11.6–15.2)

## 2012-02-02 SURGERY — ABLATION, ARRHYTHMOGENIC FOCUS, FOR ATRIAL FLUTTER
Anesthesia: General | Wound class: Clean

## 2012-02-02 MED ORDER — HYDROMORPHONE HCL PF 1 MG/ML IJ SOLN: 0.2500 mg | INTRAMUSCULAR | Status: DC | PRN

## 2012-02-02 MED ORDER — PHENYLEPHRINE HCL 10 MG/ML IJ SOLN
10.0000 mg | INTRAVENOUS | Status: DC | PRN
  Administered 2012-02-02: 50 ug/min via INTRAVENOUS

## 2012-02-02 MED ORDER — ONDANSETRON HCL 4 MG/2ML IJ SOLN: 4.0000 mg | Freq: Once | INTRAMUSCULAR | Status: AC | PRN

## 2012-02-02 MED ORDER — NEOSTIGMINE METHYLSULFATE 1 MG/ML IJ SOLN
INTRAMUSCULAR | Status: DC | PRN
  Administered 2012-02-02: 5 mg via INTRAVENOUS

## 2012-02-02 MED ORDER — LIDOCAINE HCL (CARDIAC) 20 MG/ML IV SOLN
INTRAVENOUS | Status: DC | PRN
  Administered 2012-02-02: 40 mg via INTRAVENOUS

## 2012-02-02 MED ORDER — SODIUM CHLORIDE 0.9 % IJ SOLN: 3.0000 mL | INTRAMUSCULAR | Status: DC | PRN

## 2012-02-02 MED ORDER — WARFARIN SODIUM 2.5 MG PO TABS
2.5000 mg | ORAL_TABLET | Freq: Once | ORAL | Status: AC
  Administered 2012-02-02: 2.5 mg via ORAL
  Filled 2012-02-02: qty 1

## 2012-02-02 MED ORDER — PHENYLEPHRINE HCL 10 MG/ML IJ SOLN: 10.0000 mg | INTRAVENOUS | Status: DC | PRN

## 2012-02-02 MED ORDER — SODIUM CHLORIDE 0.9 % IV SOLN: INTRAVENOUS | Status: AC

## 2012-02-02 MED ORDER — MORPHINE SULFATE 4 MG/ML IJ SOLN
4.0000 mg | INTRAMUSCULAR | Status: DC | PRN
  Administered 2012-02-02: 4 mg via INTRAVENOUS
  Filled 2012-02-02 (×2): qty 1

## 2012-02-02 MED ORDER — PROPOFOL 10 MG/ML IV EMUL
INTRAVENOUS | Status: DC | PRN
  Administered 2012-02-02: 150 mg via INTRAVENOUS

## 2012-02-02 MED ORDER — BUPIVACAINE HCL (PF) 0.25 % IJ SOLN
INTRAMUSCULAR | Status: AC
  Filled 2012-02-02: qty 60

## 2012-02-02 MED ORDER — SODIUM CHLORIDE 0.9 % IV SOLN: 250.0000 mL | INTRAVENOUS | Status: DC | PRN

## 2012-02-02 MED ORDER — LACTATED RINGERS IV SOLN
INTRAVENOUS | Status: DC | PRN
  Administered 2012-02-02: 17:00:00 via INTRAVENOUS

## 2012-02-02 MED ORDER — GLYCOPYRROLATE 0.2 MG/ML IJ SOLN
INTRAMUSCULAR | Status: DC | PRN
  Administered 2012-02-02: 0.6 mg via INTRAVENOUS

## 2012-02-02 MED ORDER — VECURONIUM BROMIDE 10 MG IV SOLR
INTRAVENOUS | Status: DC | PRN
  Administered 2012-02-02 (×2): 2 mg via INTRAVENOUS

## 2012-02-02 MED ORDER — MORPHINE SULFATE 4 MG/ML IJ SOLN: 4.0000 mg | INTRAMUSCULAR | Status: DC | PRN

## 2012-02-02 MED ORDER — ONDANSETRON HCL 4 MG/2ML IJ SOLN
INTRAMUSCULAR | Status: DC | PRN
  Administered 2012-02-02: 4 mg via INTRAVENOUS

## 2012-02-02 MED ORDER — SODIUM CHLORIDE 0.9 % IJ SOLN: 3.0000 mL | Freq: Two times a day (BID) | INTRAMUSCULAR | Status: DC

## 2012-02-02 MED ORDER — PHENYLEPHRINE HCL 10 MG/ML IJ SOLN
INTRAMUSCULAR | Status: DC | PRN
  Administered 2012-02-02 (×2): 40 ug via INTRAVENOUS

## 2012-02-02 MED ORDER — ROCURONIUM BROMIDE 100 MG/10ML IV SOLN
INTRAVENOUS | Status: DC | PRN
  Administered 2012-02-02: 50 mg via INTRAVENOUS

## 2012-02-02 MED ORDER — MORPHINE SULFATE 4 MG/ML IJ SOLN
INTRAMUSCULAR | Status: AC
  Filled 2012-02-02: qty 1

## 2012-02-02 MED ORDER — SODIUM CHLORIDE 0.9 % IV SOLN
INTRAVENOUS | Status: DC | PRN
  Administered 2012-02-02: 14:00:00 via INTRAVENOUS

## 2012-02-02 MED ORDER — ONDANSETRON HCL 4 MG/2ML IJ SOLN: 4.0000 mg | Freq: Four times a day (QID) | INTRAMUSCULAR | Status: DC | PRN

## 2012-02-02 MED ORDER — ACETAMINOPHEN 325 MG PO TABS: 650.0000 mg | ORAL_TABLET | ORAL | Status: DC | PRN

## 2012-02-02 MED ORDER — FENTANYL CITRATE 0.05 MG/ML IJ SOLN
INTRAMUSCULAR | Status: DC | PRN
  Administered 2012-02-02: 100 ug via INTRAVENOUS
  Administered 2012-02-02: 50 ug via INTRAVENOUS

## 2012-02-02 MED ORDER — LIDOCAINE HCL 4 % MT SOLN
OROMUCOSAL | Status: DC | PRN
  Administered 2012-02-02: 4 mL via TOPICAL

## 2012-02-02 NOTE — Anesthesia Procedure Notes (Signed)
Procedure Name: Intubation Date/Time: 02/02/2012 2:54 PM Performed by: Lovie Chol Pre-anesthesia Checklist: Patient identified, Emergency Drugs available, Suction available, Timeout performed and Patient being monitored Patient Re-evaluated:Patient Re-evaluated prior to inductionOxygen Delivery Method: Circle system utilized Preoxygenation: Pre-oxygenation with 100% oxygen Intubation Type: IV induction Ventilation: Mask ventilation without difficulty and Nasal airway inserted- appropriate to patient size Laryngoscope Size: Miller and 2 Grade View: Grade I Tube type: Oral Tube size: 7.5 mm Number of attempts: 1 Airway Equipment and Method: Stylet and LTA kit utilized Placement Confirmation: ETT inserted through vocal cords under direct vision,  positive ETCO2 and breath sounds checked- equal and bilateral Secured at: 21 cm Tube secured with: Tape Dental Injury: Teeth and Oropharynx as per pre-operative assessment

## 2012-02-02 NOTE — Progress Notes (Signed)
Pt off floor to cath lab for scheduled procedure:  Ablation..  Transported via bed by cath lab assistance.  Pt in no acute distress.  Amanda Pea, Charity fundraiser.

## 2012-02-02 NOTE — Progress Notes (Signed)
ANTICOAGULATION CONSULT NOTE - Follow Up Consult  Pharmacy Consult for heparin  Indication: heparin bridge for ablation--hx PE  No Known Allergies  Patient Measurements: Height: 5\' 7"  (170.2 cm) Weight: 213 lb 3 oz (96.7 kg) (Scale B.) IBW/kg (Calculated) : 66.1  Heparin Dosing Weight:   Vital Signs: Temp: 97.2 F (36.2 C) (07/08 0443) Temp src: Oral (07/08 0443) BP: 129/76 mmHg (07/08 0443) Pulse Rate: 102  (07/08 0443)  Labs:  Basename 02/02/12 0435 02/01/12 1822 02/01/12 0540 01/31/12 0650  HGB 15.7 -- 15.4 --  HCT 48.1 -- 48.2 47.7  PLT 193 -- 184 182  APTT -- -- -- --  LABPROT 29.0* -- 22.8* 23.4*  INR 2.69* -- 1.97* 2.04*  HEPARINUNFRC 0.76* 0.43 -- --  CREATININE -- -- -- 0.66  CKTOTAL -- -- -- --  CKMB -- -- -- --  TROPONINI -- -- -- --    Estimated Creatinine Clearance: 100.6 ml/min (by C-G formula based on Cr of 0.66).    Assessment: Heparin level this am 0.76 due for ablation as add-on at 1400. No bleeding noted Goal of Therapy:  HL 0.3-0.7    Plan:  Decrease to 1300 units/hr until off on call for ablation  Janice Coffin 02/02/2012,7:07 AM

## 2012-02-02 NOTE — Progress Notes (Signed)
Pt had bleeding to old  iv site on L. arm, pressure applied x2 this am.   Pharmacy on call to our unit instructed to d/c heparing gtt and since INR is 2.69 this am.  Also Alinda Money PA on call for MD made aware and instructed to monitor site.  Amanda Pea, Charity fundraiser.

## 2012-02-02 NOTE — Progress Notes (Signed)
Pt's HR 130 while ambulating from the bathroom.  Asymptomatic.  Back in bed denies any pain or discomfort.  Alinda Money PA informed.  Will continue to monitor.  Amanda Pea, Charity fundraiser.

## 2012-02-02 NOTE — Preoperative (Signed)
Beta Blockers   Reason not to administer Beta Blockers:Not Applicable 

## 2012-02-02 NOTE — Anesthesia Postprocedure Evaluation (Signed)
  Anesthesia Post-op Note  Patient: Francisco Tanner  Procedure(s) Performed: Procedure(s) (LRB): ABLATION A FLUTTER (N/A)  Patient Location: PACU  Anesthesia Type: General  Level of Consciousness: awake, alert  and oriented  Airway and Oxygen Therapy: Patient Spontanous Breathing and Patient connected to nasal cannula oxygen  Post-op Pain: none  Post-op Assessment: Post-op Vital signs reviewed and Patient's Cardiovascular Status Stable  Post-op Vital Signs: stable  Complications: No apparent anesthesia complications

## 2012-02-02 NOTE — H&P (View-Only) (Signed)
Patient Name: Francisco Tanner      SUBJECTIVE: Admitted congestive heart failure the context of atrial flutter with 2:1 conduction. He previously undergone TE cardioversion. There is a note in the old charts concerning atrial fibrillation but all ECGs at home were reviewed and no atrial fibrillation is identified.  He has undergone cardioversion on 2 prior occasions.  He has a history of pulmonary embolism prior infarcts. And  has a history of significant back issues.   Echocardiogram March 2013 demonstrated normal left ventricular function a dilated and dysfunctional RV  The patient denies chest pain, shortness of breath, nocturnal dyspnea, orthopnea or peripheral edema  Feels better today  Past Medical History  Diagnosis Date  . COPD (chronic obstructive pulmonary disease)     Wert. PFTs 10/09/08 FEV1 1.39 (44%) ratio 40, DLCO 63%. 16% improvement after bronchodilator. overnight pulse ox 02/28/09 5:26m pent with sat< 89 sleeping > declined further o2 07/05/09. HFA 75% 04/23/09.   [name] Atrial flutter     echo 09/28/11 EF 50-55%, mild RAE, RV mildly dilated, systolic fxn mild-mod reduced  . Abnormal CT scan     SPN right mid zone. first detected 08/16/08; see CT [facility]   . Weight gain     target weight 196; BMI <30  . Lung nodule   . Hypertension   . Chronic diastolic CHF (congestive heart failure)     PHYSICAL EXAM Filed Vitals:   01/31/12 2225 02/01/12 0222 02/01/12 0523 02/01/12 0747  BP: 127/90 119/83 115/45   Pulse: 94 50 101   Temp: 97.9 F (36.6 C) 97.7 F (36.5 C) 97.5 F (36.4 C)   TempSrc: Oral Oral Oral   Resp: 18 20 18    Height:      Weight:   213 lb 13.5 oz (97 kg)   SpO2: 95% 91% 94% 96%    Well developed and nourished in no acute distress HENT normal Neck supple with JVP .10 Crackles  Fast and irregular rate Abd-soft with active BS No Clubbing cyanosis 1+ edema Skin-warm and dry A & Oriented  Grossly normal sensory and motor function   TELEMETRY: Reviewed  telemetry pt in atrial flutter with variable rates:    Intake/Output Summary (Last 24 hours) at 02/01/12 1008 Last data filed at 02/01/12 0900  Gross per 24 hour  Intake    800 ml  Output   3050 ml  Net  -2250 ml    LABS: Basic Metabolic Panel:  Lab 01/31/12 1610 01/30/12 0518 01/29/12 0610 01/28/12 1725  NA 139 141 143 145  K 4.1 3.9 3.9 3.7  CL 96 99 103 103  CO2 30 28 30 29   GLUCOSE 105* 111* 103* 105*  BUN 16 14 10 12   CREATININE 0.66 0.66 0.60 0.60  CALCIUM 9.4 9.5 -- --  MG -- -- -- --  PHOS -- -- -- --   Cardiac Enzymes: No results found for this basename: CKTOTAL:3,CKMB:3,CKMBINDEX:3,TROPONINI:3 in the last 72 hours CBC:  Lab 02/01/12 0540 01/31/12 0650 01/30/12 0518 01/29/12 0610 01/28/12 1725  WBC 6.2 6.8 5.9 4.7 5.9  NEUTROABS -- -- -- -- --  HGB 15.4 15.1 14.8 13.2 13.7  HCT 48.2 47.7 46.0 42.5 44.0  MCV 87.6 88.8 87.6 89.3 90.3  PLT 184 182 193 174 163   PROTIME:  Basename 02/01/12 0540 01/31/12 0650 01/30/12 0518  LABPROT 22.8* 23.4* 28.1*  INR 1.97* 2.04* 2.58*    No results found for this basename: TSH,T4TOTAL,FREET3,T3FREE,THYROIDAB in the last 72 hours  ASSESSMENT AND PLAN:  Patient Active Hospital Problem List: Acute on chronic diastolic heart failure (02/19/2011)   PULMONARY HYPERTENSION, SECONDARY (10/09/2008)   COPD UNSPECIFIED (09/08/2008)   History of pulmonary embolism (01/28/2012)   Atrial flutter with rapid ventricular response (01/28/2012)   Cellulitis (01/28/2012)  * INR subtherapeutic will begin heparin Plan RFCA in am If bumped by neuroradiology will do DCCV and reschedule    Signed, Sherryl Manges MD  02/01/2012

## 2012-02-02 NOTE — Anesthesia Preprocedure Evaluation (Addendum)
Anesthesia Evaluation  Patient identified by MRN, date of birth, ID band Patient awake    Reviewed: Allergy & Precautions, H&P , NPO status , Patient's Chart, lab work & pertinent test results  History of Anesthesia Complications Negative for: history of anesthetic complications  Airway Mallampati: I TM Distance: >3 FB Neck ROM: Full    Dental  (+) Edentulous Upper, Missing and Dental Advisory Given   Pulmonary COPD COPD inhaler, former smoker, PE 1 ppd x 20 years; quit 5 years ago  PE 2012         Cardiovascular hypertension, Pt. on medications +CHF + dysrhythmias Atrial Fibrillation     Neuro/Psych    GI/Hepatic   Endo/Other    Renal/GU      Musculoskeletal   Abdominal   Peds  Hematology   Anesthesia Other Findings   Reproductive/Obstetrics Enlarged prostate                           Anesthesia Physical Anesthesia Plan  ASA: III  Anesthesia Plan: General   Post-op Pain Management:    Induction: Intravenous  Airway Management Planned: LMA  Additional Equipment:   Intra-op Plan:   Post-operative Plan:   Informed Consent: I have reviewed the patients History and Physical, chart, labs and discussed the procedure including the risks, benefits and alternatives for the proposed anesthesia with the patient or authorized representative who has indicated his/her understanding and acceptance.     Plan Discussed with: CRNA and Surgeon  Anesthesia Plan Comments: (AFlutter Htn COPD Chronic Low Back Pain GERD  Plan GA with LMA  Kipp Brood, MD+)        Anesthesia Quick Evaluation

## 2012-02-02 NOTE — CV Procedure (Signed)
Ryin Ambrosius [id number]  [id number]  Preop Dx: Sandra Cockayne Dx same/  Procedure:catheter ablation EPS    Dictation number [id number] Sherryl Manges, MD 02/02/2012 4:30 PM

## 2012-02-02 NOTE — Op Note (Signed)
NAMEAQUILLA, VOILES NO.:  [id number]  MEDICAL RECORD NO.:  [id number]  LOCATION:  4737                         FACILITY:  [facility]  PHYSICIAN:  Duke Salvia, MD, FACCDATE OF BIRTH:  01/22/1946  DATE OF PROCEDURE:  02/02/2012 DATE OF DISCHARGE:                              OPERATIVE REPORT   PREOPERATIVE DIAGNOSIS:  Atrial flutter.  POSTOPERATIVE DIAGNOSIS:  Sinus rhythm.  PROCEDURES:  Invasive electrophysiological study with radiofrequency catheter ablation.  Following obtaining informed consent, the patient was brought to the electrophysiology laboratory and placed on the fluoroscopic table in supine position.  He was sedated under general anesthesia.  After routine prep and drape, catheterization was performed with some local anesthesia support.  I was not able to feel palpable pulses and so we actually used a micropuncture kit to identify the veins which we did successfully without puncturing the artery.  Venipunctures were accomplished on both sides.  Following the procedure, the catheters were removed.  The sheaths were removed in the holding area and the patient was transferred back to the floor in stable condition.  CATHETERS:  A 5-French quadripolar catheter was inserted via the  left femoral vein to the AV junction. A 6-French quadripolar catheter was inserted via the right femoral vein to the coronary sinus. A 7-French dual decapolar catheter was inserted via the left femoral vein to the tricuspid anulus. An 8-French 10 mm ablation tip catheter was inserted via the right femoral vein using SAFL sheath to mapping sites in the posterior septal space.  SURFACE LEADS:  1, aVF and V1 were monitored continuously throughout the procedure.  END-TIDAL RESULTS:  End-tidal surface electrocardiogram. Initial:  Rhythm:  Atrial flutter; RR interval 510 msec; AA interval 243 msec; QRS duration 188 msec; QT interval 373 msec; AH interval 45 msec. Rhythm:   Final:  Sinus; RR interval 706 msec; PR interval 168 msec; P- wave duration 135 msec; QRS duration 184 msec; QT interval 437 msec. AV nodal function.  AV Wenckebach was less than 400 msec. VA Wenckebach was at 360 msec. AV nodal effective refractory period at paced cycle length of 500 msec was 320 msec without evidence of discontinuity.  No accessory pathway function was identified. Ventricular response to programed stimulation was normal for V-stim as described.   Arrhythmias; the patient presented to the lab and counterclockwise atrial flutter mapped by electrogram sequencing.  Catheter ablation across the cavotricuspid isthmus first terminated the tachycardia, subsequently it was associated with the development of bidirectional isthmus block with an A1, A2 interval of 150 msec.  FLUOROSCOPY TIME:  A total of 13 minutes and 3 seconds of fluoroscopy time was utilized at 15 frames per second.  RADIOFREQUENCY ENERGY:  A total of 10 minutes and 17 seconds of RF energy was applied across the cavotricuspid isthmus.  SUMMARY:  In conclusion, the results of electrophysiological testing confirmed cavotricuspid isthmus conduction dependent flutter as the mechanism of the patient's arrhythmia.  Catheter ablation successfully interrupted conduction across the isthmus.  Bidirectional isthmus block was present.  The patient will be continued on anticoagulation for at least 4 weeks. He will be followed up thereafter.     Salvatore Decent  Graciela Husbands, MD, [facility]     SCK/MEDQ  D:  02/02/2012  T:  02/02/2012  Job:  [id number]

## 2012-02-02 NOTE — Transfer of Care (Signed)
Immediate Anesthesia Transfer of Care Note  Patient: Francisco Tanner  Procedure(s) Performed: Procedure(s) (LRB): ABLATION A FLUTTER (N/A)  Patient Location: Cath Lab  Anesthesia Type: General  Level of Consciousness: awake, alert  and patient cooperative  Airway & Oxygen Therapy: Patient Spontanous Breathing and Patient connected to face mask oxygen  Post-op Assessment: Report given to PACU RN and Post -op Vital signs reviewed and stable  Post vital signs: Reviewed  Complications: No apparent anesthesia complications

## 2012-02-02 NOTE — Interval H&P Note (Signed)
History and Physical Interval Note:  02/02/2012 10:14 AM  Francisco Tanner  has presented today for surgery, with the diagnosis of aflutter  The various methods of treatment have been discussed with the patient and family. After consideration of risks, benefits and other options for treatment, the patient has consented to  Procedure(s) (LRB): ABLATION A FLUTTER (N/A) as a surgical intervention .  The patient's history has been reviewed, patient examined, no change in status, stable for surgery.  I have reviewed the patients' chart and labs.  Questions were answered to the patient's satisfaction.     Sherryl Manges  Pt INR  2.6  Will proceed with anesthesia supported catheter ablation of atrial flutter

## 2012-02-02 NOTE — Progress Notes (Signed)
ANTICOAGULATION CONSULT NOTE - Follow Up Consult  Pharmacy Consult for Heparin / Coumadin  Indication: heparin bridge for ablation--hx PE  No Known Allergies  Labs:  Basename 02/02/12 0435 02/01/12 1822 02/01/12 0540 01/31/12 0650  HGB 15.7 -- 15.4 --  HCT 48.1 -- 48.2 47.7  PLT 193 -- 184 182  APTT -- -- -- --  LABPROT 29.0* -- 22.8* 23.4*  INR 2.69* -- 1.97* 2.04*  HEPARINUNFRC 0.76* 0.43 -- --  CREATININE -- -- -- 0.66  CKTOTAL -- -- -- --  CKMB -- -- -- --  TROPONINI -- -- -- --    Estimated Creatinine Clearance: 100.6 ml/min (by C-G formula based on Cr of 0.66).  Assessment: INR therapeutic at 2.69 Bleeding from old IV site Heparin dced for now  Goal of Therapy:  Heparin level = 0.3 to 0.7 INR = 2 to 3  Plan:  1) DC heparin for now 2) Coumadin 2.5 mg po x 1 dose today 3) Follow up AM labs, s/p ablation  Thank you. Okey Regal, PharmD [phone]

## 2012-02-03 ENCOUNTER — Encounter (HOSPITAL_COMMUNITY): Payer: Self-pay | Admitting: Nurse Practitioner

## 2012-02-03 LAB — BASIC METABOLIC PANEL
CO2: 27 mEq/L (ref 19–32)
GFR calc non Af Amer: 89 mL/min — ABNORMAL LOW (ref >90)
Glucose, Bld: 132 mg/dL — ABNORMAL HIGH (ref 70–99)
Potassium: 4.2 mEq/L (ref 3.5–5.1)
Sodium: 135 mEq/L (ref 135–145)

## 2012-02-03 MED ORDER — POTASSIUM CHLORIDE 20 MEQ/15ML (10%) PO LIQD: 10.0000 meq | Freq: Every day | ORAL | Status: DC

## 2012-02-03 MED ORDER — WARFARIN SODIUM 2.5 MG PO TABS
2.5000 mg | ORAL_TABLET | Freq: Once | ORAL | Status: DC
  Filled 2012-02-03: qty 1

## 2012-02-03 MED ORDER — TORSEMIDE 20 MG PO TABS: 20.0000 mg | ORAL_TABLET | Freq: Two times a day (BID) | ORAL | Status: DC

## 2012-02-03 NOTE — Discharge Summary (Signed)
Patient ID: Francisco Tanner,  MRN: [id number], DOB/AGE: [birth year] 66 y.o.  Admit date: 01/28/2012 Discharge date: 02/03/2012  Primary Care Provider: Josue Tanner Primary Cardiologist: J. Elishah Ashmore, MD  Discharge Diagnoses Principal Problem:  *Acute on chronic diastolic heart failure  *S/P 23 lb diuresis this admission (D/C wt of 213 lbs) Active Problems:  Atrial flutter with rapid ventricular response  *S/P Electrophysiology study and successful radiofrequency catheter ablation of A. Flutter this admission.  *Chronic coumadin  PULMONARY HYPERTENSION, SECONDARY  COPD UNSPECIFIED  Hypertension  PULMONARY NODULE  History of pulmonary embolism  Cellulitis  Allergies No Known Allergies  Procedures  2D Echocardiogram 02/01/2012  Study Conclusions  - Left ventricle: The cavity size was normal. There was mild concentric hypertrophy. Systolic function was mildly reduced. The estimated ejection fraction was in the range of 45% to 50%. Therewas septal wall flattening as well as septal bounce consistent with elevated right ventricular pressure/ volume overload. Asynchronus contraction. No obvious wall motion abnormalities. Doppler parameters are consistent with abnormal left ventricular relaxation (grade 1 diastolic dysfunction). - Left atrium: The atrium was mildly dilated. - Right ventricle: The cavity size was moderately to severely dilated. Systolic function was moderately reduced. - Right atrium: The atrium was mildly dilated. _____________  02/02/2012 EP Study and RFCA of Atrial Flutter  The results of electrophysiological testing confirmed cavotricuspid isthmus conduction dependent flutter as the mechanism of the patient's arrhythmia.  Catheter ablation successfully interrupted conduction across the isthmus.  Bidirectional isthmus block was present.  History of Present Illness  66 y/o male with the above complex problem list.  Pt was seen in clinic on 7/3 with complaints  of worsening dyspnea, lower extremity edema, and tachypalpitations.  He was found to be hypoxic with an O2 sat of 85% on room air.  ECG demonstrated atrial flutter with 2:1 conduction and a rate of 134 beats per minute.  Decision was made to admit him directly to the hospital for mgmt of acute on chronic diastolic chf and atrial flutter.  Hospital Course  Following admission, pt was placed on IV diuresis.  He was also placed on Diltiazem therapy, which was titrated for improved HR control.  He was maintained on his home dose of heparin and briefly bridged with heparin for a 1 day dip in his INR below 2.0.  With diuresis, his weight has been reduced by 23 lbs from 236 on admission to 213 at discharge.  Renal function remained stable throughout hospitalization.  Because of ongoing atrial flutter and it probable contribution to his diastolic chf, EP was consulted.  Decision was made to pursue EP study and RFCA.  This was successfully carried out on 02/02/2012.  Pt tolerated procedure well and has maintained sinus rhythm.  He will be discharged home today in good condition.  We have provided him with a Rx for a f/u bmet on 7/15 (he plans to have this drawn @ his PCP's office).  We have also arranged for f/u in our office within the next 7 days.  Discharge Vitals Blood pressure 118/75, pulse 83, temperature 97.6 F (36.4 C), temperature source Oral, resp. rate 20, height 5\' 7"  (1.702 m), weight 213 lb 6.4 oz (96.798 kg), SpO2 96.00%.  Filed Weights   02/01/12 0523 02/02/12 0443 02/03/12 0645  Weight: 213 lb 13.5 oz (97 kg) 213 lb 3 oz (96.7 kg) 213 lb 6.4 oz (96.798 kg)   Labs  CBC  Basename 02/02/12 0435 02/01/12 0540  WBC 6.0 6.2  NEUTROABS -- --  HGB 15.7 15.4  HCT 48.1 48.2  MCV 87.5 87.6  PLT 193 184   Basic Metabolic Panel  Basename 02/03/12 0853 02/02/12 1034  NA 135 138  K 4.2 4.7  CL 96 96  CO2 27 29  GLUCOSE 132* 110*  BUN 23 24*  CREATININE 0.84 0.81  CALCIUM 8.9 9.4  MG --  2.1  PHOS -- --   Cardiac Enzymes Lab Results  Component Value Date   CKTOTAL 50 01/29/2012   CKMB 3.1 01/29/2012   TROPONINI <0.30 01/29/2012   Thyroid Function Tests Lab Results  Component Value Date   TSH 1.160 01/28/2012   Disposition  Pt is being discharged home today in good condition.  Follow-up Plans & Appointments  Follow-up Information    Follow up with Tereso Newcomer, PA on 02/10/2012. (11:10 AM - Dr. Jenene Slicker PA)    Contact information:   [address] [place] [address] [phone]       Follow up with Francisco Hector, MD on 02/09/2012. (For Blood Chemisty - prescription provided.;  Follow-up for coumadin level as previously Rx.)    Contact information:   [address] [facility] [address] [phone]        Discharge Medications  Medication List  As of 02/03/2012  3:03 PM   STOP taking these medications         diltiazem 240 MG 24 hr capsule         TAKE these medications         AMBIEN 10 MG tablet   Generic drug: zolpidem   Take 10 mg by mouth at bedtime.      budesonide-formoterol 160-4.5 MCG/ACT inhaler   Commonly known as: SYMBICORT   Inhale 2 puffs into the lungs 2 (two) times daily.      FLOMAX 0.4 MG Caps   Generic drug: Tamsulosin HCl   Take 0.4 mg by mouth at bedtime.      HYDROcodone-acetaminophen 5-500 MG per tablet   Commonly known as: VICODIN   Take 1 tablet by mouth every 6 (six) hours as needed. For hip pain/pain      omeprazole 20 MG capsule   Commonly known as: PRILOSEC   Take 20 mg by mouth daily.      oxybutynin 5 MG tablet   Commonly known as: DITROPAN   Take 5 mg by mouth at bedtime.      potassium chloride 20 MEQ/15ML (10%) solution   Take 7.5 mLs (10 mEq total) by mouth daily.      sodium chloride 0.65 % nasal spray   Commonly known as: OCEAN   Place 1 spray into the nose as needed. For dryness      torsemide 20 MG tablet   Commonly known as: DEMADEX   Take 1  tablet (20 mg total) by mouth 2 (two) times daily.      warfarin 5 MG tablet   Commonly known as: COUMADIN   Take 2.5-5 mg by mouth daily. Take 1 tablet (5mg ) on Tuesday and Thursday. On Monday, Wednesday, Friday, Saturday, and Sunday, take 2.5 mg, one half tablet.          Outstanding Labs/Studies  F/U BMET on 7/15.  F/U INR as previously scheduled.  Duration of Discharge Encounter   Greater than 30 minutes including physician time.  Signed, Nicolasa Ducking NP 02/03/2012, 3:03 PM

## 2012-02-03 NOTE — Progress Notes (Signed)
    SUBJECTIVE:  He feels great.  Breathing better. Ready to go home  PHYSICAL EXAM Filed Vitals:   02/02/12 2200 02/02/12 2300 02/03/12 0224 02/03/12 0645  BP: 133/77 118/85 129/80 104/66  Pulse: 97 90 88 93  Temp:   98.1 F (36.7 C) 97.6 F (36.4 C)  TempSrc:   Oral Oral  Resp:   20 20  Height:      Weight:    213 lb 6.4 oz (96.798 kg)  SpO2:   93% 93%   General:  No distress Lungs:  Clear Heart:  Regular Abdomen:  Positive bowel sounds, no rebound no guarding Extremities:  No edema  LABS: Lab Results  Component Value Date   CKTOTAL 50 01/29/2012   CKMB 3.1 01/29/2012   TROPONINI <0.30 01/29/2012   Results for orders placed during the hospital encounter of 01/28/12 (from the past 24 hour(s))  BASIC METABOLIC PANEL     Status: Abnormal   Collection Time   02/02/12 10:34 AM      Component Value Range   Sodium 138  135 - 145 mEq/L   Potassium 4.7  3.5 - 5.1 mEq/L   Chloride 96  96 - 112 mEq/L   CO2 29  19 - 32 mEq/L   Glucose, Bld 110 (*) 70 - 99 mg/dL   BUN 24 (*) 6 - 23 mg/dL   Creatinine, Ser 1.61  0.50 - 1.35 mg/dL   Calcium 9.4  8.4 - 09.6 mg/dL   GFR calc non Af Amer >90  >90 mL/min   GFR calc Af Amer >90  >90 mL/min  MAGNESIUM     Status: Normal   Collection Time   02/02/12 10:34 AM      Component Value Range   Magnesium 2.1  1.5 - 2.5 mg/dL  PROTIME-INR     Status: Abnormal   Collection Time   02/03/12  5:35 AM      Component Value Range   Prothrombin Time 28.9 (*) 11.6 - 15.2 seconds   INR 2.67 (*) 0.00 - 1.49    Intake/Output Summary (Last 24 hours) at 02/03/12 0826 Last data filed at 02/03/12 0600  Gross per 24 hour  Intake 1060.75 ml  Output   1451 ml  Net -390.25 ml    ASSESSMENT AND PLAN:  Atrial flutter:  Status post flutter ablation.  EKG today with sinus.  Therapeutic on warfarin.  OK to discharge.  No Cardizem at discharge.    Acute on chronic diastolic heart failure:  Weight down 23 lbs.  Discharge on 20 mg bid Demadex. KCL 10 meq daily.   Needs a BMET in 5 days results to me.  See me or extender within 10 days.  Check BMET before discharge.      Fayrene Fearing [facility] 02/03/2012 8:26 AM

## 2012-02-03 NOTE — Progress Notes (Signed)
D/C instructions given to pt by Charge nurse JF. RN.  Amanda Pea, RN

## 2012-02-03 NOTE — Progress Notes (Signed)
All d/c instructions given by charge nurse JF, RN.  Amanda Pea, Charity fundraiser.

## 2012-02-03 NOTE — Progress Notes (Signed)
ANTICOAGULATION CONSULT NOTE - Follow Up Consult  Pharmacy Consult for Coumadin  Indication: Post ablation for Aflutter--hx PE  No Known Allergies  Labs:  Basename 02/03/12 0853 02/03/12 0535 02/02/12 1034 02/02/12 0435 02/01/12 1822 02/01/12 0540  HGB -- -- -- 15.7 -- 15.4  HCT -- -- -- 48.1 -- 48.2  PLT -- -- -- 193 -- 184  APTT -- -- -- -- -- --  LABPROT -- 28.9* -- 29.0* -- 22.8*  INR -- 2.67* -- 2.69* -- 1.97*  HEPARINUNFRC -- -- -- 0.76* 0.43 --  CREATININE 0.84 -- 0.81 -- -- --  CKTOTAL -- -- -- -- -- --  CKMB -- -- -- -- -- --  TROPONINI -- -- -- -- -- --    Estimated Creatinine Clearance: 95.9 ml/min (by C-G formula based on Cr of 0.84).  Assessment: INR therapeutic at 2.67 Bleeding from old IV site   Goal of Therapy:  INR = 2 to 3  Plan:  1) Coumadin 2.5 mg po x 1 dose today 2) Recommend restarting home dosing of Coumadin on discharge - 2.5mg  daily except 5mg  Tue/Thu. 3) Pt needs close Coumadin f/u outpatient since pt presented with high INR.    Vania Rea. Darin Engels.D. Clinical Pharmacist Pager [phone] Phone [phone] 02/03/2012 10:27 AM

## 2012-02-04 ENCOUNTER — Telehealth: Payer: Self-pay | Admitting: Cardiology

## 2012-02-04 NOTE — Telephone Encounter (Signed)
New msg Pt wants to talk to you about seeing Dr Antoine Poche in [place] on 0717 please call

## 2012-02-05 ENCOUNTER — Telehealth: Payer: Self-pay | Admitting: Pharmacist

## 2012-02-05 DIAGNOSIS — I5033 Acute on chronic diastolic (congestive) heart failure: Secondary | ICD-10-CM

## 2012-02-05 NOTE — Telephone Encounter (Signed)
Spoke with pt.  He is doing well since hospitalization.  He has even diuresed 3 more pounds.  He had a few questions about his medications that were answered.  He had changed his appt from 7/16 to 8/5.  Discussed  the importance of following up quickly to make sure he was doing okay and recheck labs.  He agreed to follow up in next week as previously planned.  His appt was remade.

## 2012-02-09 ENCOUNTER — Encounter: Payer: Self-pay | Admitting: Cardiology

## 2012-02-10 ENCOUNTER — Encounter: Payer: Self-pay | Admitting: Physician Assistant

## 2012-02-10 ENCOUNTER — Encounter: Payer: Medicare Other | Admitting: Physician Assistant

## 2012-02-10 ENCOUNTER — Ambulatory Visit (INDEPENDENT_AMBULATORY_CARE_PROVIDER_SITE_OTHER): Payer: Medicare Other | Admitting: Physician Assistant

## 2012-02-10 VITALS — BP 104/65 | HR 80 | Ht 67.5 in | Wt 216.0 lb

## 2012-02-10 DIAGNOSIS — I4892 Unspecified atrial flutter: Secondary | ICD-10-CM

## 2012-02-10 DIAGNOSIS — I5032 Chronic diastolic (congestive) heart failure: Secondary | ICD-10-CM

## 2012-02-10 DIAGNOSIS — Z7901 Long term (current) use of anticoagulants: Secondary | ICD-10-CM

## 2012-02-10 DIAGNOSIS — J449 Chronic obstructive pulmonary disease, unspecified: Secondary | ICD-10-CM

## 2012-02-10 NOTE — Progress Notes (Signed)
[address] [place], [place]  [address] Phone: [phone] Fax:  [phone]  Date:  02/10/2012   Name:  Francisco Tanner   DOB:  [birth year]   MRN:  [id number]  PCP:  Josue Hector, MD  Primary Cardiologist:  Dr. Rollene Rotunda  Primary Electrophysiologist:  Dr. Sherryl Manges    History of Present Illness: Francisco Tanner is a 66 y.o. male who returns for post hospital follow up.  He has a history of diastolic CHF, atrial flutter, COPD, prior pulmonary embolism, HTN.  LHC 7/05: oMOM 25%, EF 65%.    He was admitted 7/3-7/9 with a/c diastolic CHF in the setting of atrial flutter with RVR.  He was admitted from the office.  He was diuresed.  His weight was down 23 pounds by discharge.  EP was consulted.  He saw Dr. Graciela Husbands.  He was set up for EP study and RFCA on 7/8 of his AFlutter.  He was able to tolerate this and maintain sinus rhythm.  Echocardiogram 02/01/12: Mild LVH, EF 45-50%, grade 1 diastolic dysfunction, mild LAE, moderately reduced RV function, mild RAE.  He was to have a followup basic metabolic panel prior to this visit.  Those results are not available to me at this time.  Doing well.  No change in weights at home.  Breathing is stable.  LE edema remains controlled.  No orthopnea, PND, chest pain or syncope.    Wt Readings from Last 3 Encounters:  02/10/12 216 lb (97.977 kg)  02/03/12 213 lb 6.4 oz (96.798 kg)  02/03/12 213 lb 6.4 oz (96.798 kg)     Potassium  Date/Time Value Range Status  02/03/2012  8:53 AM 4.2  3.5 - 5.1 mEq/L Final     Creatinine, Ser  Date/Time Value Range Status  02/03/2012  8:53 AM 0.84  0.50 - 1.35 mg/dL Final    Past Medical History  Diagnosis Date  . COPD (chronic obstructive pulmonary disease)     Wert. PFTs 10/09/08 FEV1 1.39 (44%) ratio 40, DLCO 63%. 16% improvement after bronchodilator. overnight pulse ox 02/28/09 5:55m pent with sat< 89 sleeping > declined further o2 07/05/09. HFA 75% 04/23/09.   [name] Atrial flutter    a. echo 09/28/11 EF 50-55%, mild RAE, RV mildly dilated, systolic fxn mild-mod reduced;  b. 01/2012 s/p EPS & RFCA.  [name] Abnormal CT scan     SPN right mid zone. first detected 08/16/08; see [facility]   . Weight gain     target weight 196; BMI <30  . Lung nodule   . Hypertension   . Chronic diastolic CHF (congestive heart failure)     Current Outpatient Prescriptions  Medication Sig Dispense Refill  . budesonide-formoterol (SYMBICORT) 160-4.5 MCG/ACT inhaler Inhale 2 puffs into the lungs 2 (two) times daily.       [name] HYDROcodone-acetaminophen (VICODIN) 5-500 MG per tablet Take 1 tablet by mouth every 6 (six) hours as needed. For hip pain/pain      . omeprazole (PRILOSEC) 20 MG capsule Take 20 mg by mouth daily.      [name] oxybutynin (DITROPAN) 5 MG tablet Take 5 mg by mouth at bedtime.      . potassium chloride 20 MEQ/15ML (10%) solution Take 7.5 mLs (10 mEq total) by mouth daily.  500 mL  1  . sodium chloride (OCEAN) 0.65 % nasal spray Place 1 spray into the nose as needed. For dryness      . Tamsulosin HCl (FLOMAX) 0.4 MG CAPS  Take 0.4 mg by mouth at bedtime.        . torsemide (DEMADEX) 20 MG tablet Take 1 tablet (20 mg total) by mouth 2 (two) times daily.  60 tablet  6  . warfarin (COUMADIN) 5 MG tablet Take 2.5-5 mg by mouth daily. Take 1 tablet (5mg ) on Tuesday and Thursday. On Monday, Wednesday, Friday, Saturday, and Sunday, take 2.5 mg, one half tablet.      [name] zolpidem (AMBIEN) 10 MG tablet Take 10 mg by mouth at bedtime.         Allergies: No Known Allergies  History  Substance Use Topics  . Smoking status: Former Smoker -- 0.5 packs/day for 20 years    Types: Cigarettes    Quit date: 07/28/2002  . Smokeless tobacco: Never Used  . Alcohol Use: No     ROS:  Please see the history of present illness.   Notes floaters in the OD.  No amaurosis fugax.  All other systems reviewed and negative.   PHYSICAL EXAM: VS:  BP 104/65  Pulse 80  Ht 5' 7.5" (1.715 m)  Wt 216 lb (97.977 kg)   BMI 33.33 kg/m2 Well nourished, well developed, in no acute distress HEENT: normal Neck: no JVD at 90 degrees Cardiac:  normal S1, S2; RRR; no murmur Lungs:  Decreased breath sounds bilaterally with faint exp wheezes bilat Abd: soft, nontender, no hepatomegaly Ext: trace bilateral LE edema; right groin without hematoma or bruit  Skin: warm and dry Neuro:  CNs 2-12 intact, no focal abnormalities noted  EKG:  Sinus rhythm, heart rate 85, right bundle branch block, Rightward axis, no change from prior tracing      ASSESSMENT AND PLAN:  1.  Chronic Diastolic CHF Volume is stable. We will keep an eye out for his BMET done yesterday.  He was called by his PCP and told his "labs looked good." Continue current Rx. Follow up with Dr. Rollene Rotunda in 3-4 weeks as planned.  2.  Atrial Flutter Maintaining NSR. Coumadin managed by PCP.   3.  Hypertension Controlled.  Continue current therapy.   4.  COPD Managed by pulmonary.  Luna Glasgow, PA-C  12:07 PM 02/10/2012

## 2012-02-10 NOTE — Patient Instructions (Addendum)
Keep appointment with Dr. Antoine Poche 03/01/12   No changes were made today

## 2012-02-11 ENCOUNTER — Ambulatory Visit: Payer: Managed Care, Other (non HMO) | Admitting: Cardiology

## 2012-03-01 ENCOUNTER — Ambulatory Visit (INDEPENDENT_AMBULATORY_CARE_PROVIDER_SITE_OTHER): Payer: Medicare Other | Admitting: Cardiology

## 2012-03-01 ENCOUNTER — Encounter: Payer: Self-pay | Admitting: Cardiology

## 2012-03-01 VITALS — BP 118/68 | HR 73 | Ht 67.0 in | Wt 221.5 lb

## 2012-03-01 DIAGNOSIS — I4892 Unspecified atrial flutter: Secondary | ICD-10-CM

## 2012-03-01 NOTE — Progress Notes (Signed)
HPI The patient presents for followup of atrial flutter and diastolic heart failure following a hospitalization several weeks ago.  At the time of that hospitalization he had excellent diuresis and he had a flutter ablation. Since then he has done quite well from a cardiovascular standpoint though unfortunately he's very limited by his back and joint pains.  He has not felt any symptoms consistent with his previous flutter. His weights have been very steady not fluctuating by more than a pound or 2 per day. He is watching his salt and fluid. His breathing is improved. He's not describing any PND or orthopnea. He's not describing any chest pressure, neck or arm discomfort.  No Known Allergies  Current Outpatient Prescriptions  Medication Sig Dispense Refill  . budesonide-formoterol (SYMBICORT) 160-4.5 MCG/ACT inhaler Inhale 2 puffs into the lungs 2 (two) times daily.       [name] HYDROcodone-acetaminophen (VICODIN) 5-500 MG per tablet Take 1 tablet by mouth every 6 (six) hours as needed. For hip pain/pain       . omeprazole (PRILOSEC) 20 MG capsule Take 20 mg by mouth daily.      [name] oxybutynin (DITROPAN) 5 MG tablet Take 5 mg by mouth at bedtime.      . potassium chloride 20 MEQ/15ML (10%) solution Take 7.5 mLs (10 mEq total) by mouth daily.  500 mL  1  . sodium chloride (OCEAN) 0.65 % nasal spray Place 1 spray into the nose as needed. For dryness      . Tamsulosin HCl (FLOMAX) 0.4 MG CAPS Take 0.4 mg by mouth at bedtime.        . torsemide (DEMADEX) 20 MG tablet Take 1 tablet (20 mg total) by mouth 2 (two) times daily.  60 tablet  6  . warfarin (COUMADIN) 5 MG tablet Take 2.5-5 mg by mouth daily. Take 1 tablet (5mg ) on Tuesday and Thursday. On Monday, Wednesday, Friday, Saturday, and Sunday, take 2.5 mg, one half tablet.       [name] zolpidem (AMBIEN) 10 MG tablet Take 10 mg by mouth at bedtime.         Past Medical History  Diagnosis Date  . COPD (chronic obstructive pulmonary disease)     Wert.  PFTs 10/09/08 FEV1 1.39 (44%) ratio 40, DLCO 63%. 16% improvement after bronchodilator. overnight pulse ox 02/28/09 5:23m pent with sat< 89 sleeping > declined further o2 07/05/09. HFA 75% 04/23/09.   [name] Atrial flutter     a. echo 09/28/11 EF 50-55%, mild RAE, RV mildly dilated, systolic fxn mild-mod reduced;  b. 01/2012 s/p EPS & RFCA.  [name] Abnormal CT scan     SPN right mid zone. first detected 08/16/08; see [facility]   . Weight gain     target weight 196; BMI <30  . Lung nodule   . Hypertension   . Chronic diastolic CHF (congestive heart failure)     Past Surgical History  Procedure Date  . Hip replacement surgery     L   . Kyphosis surgery   . Back surgery     x4  . Knee surgery   . Cardioversion     in past  . Incision and drainage hip 09/24/2011    Procedure: IRRIGATION AND DEBRIDEMENT HIP;  Surgeon: Loanne Drilling, MD;  Location: [facility] ORS;  Service: Orthopedics;  Laterality: Left;    ROS:  As stated in the HPI and negative for all other systems.  PHYSICAL EXAM BP 118/68  Pulse 73  Ht 5\' 7"  (1.702 m)  Wt 221 lb 8 oz (100.472 kg)  BMI 34.69 kg/m2GEN: No distress  NECK: No jugular venous distention at 90 degrees, waveform within normal limits, carotid upstroke brisk and symmetric, no bruits, no thyromegaly  LUNGS: Clear to auscultation bilaterally  BACK: No CVA tenderness  CHEST: Unremarkable  HEART: S1 and S2 within normal limits, no S3, no S4, no clicks, no rubs, no murmurs  ABD: Positive bowel sounds normal in frequency in pitch, no bruits, no rebound, no guarding, unable to assess midline mass or bruit with the patient seated.  EXT: 2 plus pulses upper and diminished bilateral lower, trace edema, no cyanosis no clubbing    ASSESSMENT AND PLAN  1. Chronic Diastolic CHF  He seems to be euvolemic.  At this point, no change in therapy is indicated.  We have reviewed salt and fluid restrictions.  No further cardiovascular testing is indicated.  2. Atrial Flutter  He is status  post ablation. He will continue his warfarin as there was some fibrillation in the past.  3. Hypertension  Controlled. Continue current therapy.   4. COPD  He is to see Dr. Sherene Sires tomorrow.

## 2012-03-01 NOTE — Patient Instructions (Addendum)
Your physician wants you to follow-up in:  6 months. You will receive a reminder letter in the mail two months in advance. If you don't receive a letter, please call our office to schedule the follow-up appointment.   

## 2012-03-02 ENCOUNTER — Ambulatory Visit (INDEPENDENT_AMBULATORY_CARE_PROVIDER_SITE_OTHER): Payer: Medicare Other | Admitting: Internal Medicine

## 2012-03-02 ENCOUNTER — Encounter: Payer: Self-pay | Admitting: Internal Medicine

## 2012-03-02 VITALS — BP 120/72 | HR 96 | Temp 98.0°F | Ht 67.5 in | Wt 221.0 lb

## 2012-03-02 DIAGNOSIS — J449 Chronic obstructive pulmonary disease, unspecified: Secondary | ICD-10-CM

## 2012-03-02 NOTE — Patient Instructions (Addendum)
We will be contacting you about your follow up CT Chest  Please schedule a follow up visit in 6 months but call sooner if needed

## 2012-03-02 NOTE — Assessment & Plan Note (Signed)
GOLD III but no limiting symptoms or tendency to aecopd on the rather unconventional approach of using prn symbicort 160 2bid  Since he's doing so well on the "European approach" to use of symbicort rec no change rx  F/u can be every 3 months

## 2012-03-02 NOTE — Progress Notes (Signed)
Subjective:     Patient ID: Francisco Tanner, male   DOB: 1945-11-01 .   MRN: [id number]  HPI 34  yowm quit smoking 2004 with GOLD III COPD by PFT's 2010  And PE with infartions 01/2011  Admit [facility] 1/21 -29/2010 with 1  year orthopnea/pnd then gradual anasarca and caf > coumadin/ cardioversion 08/23/08 > marked improvement, discharged on 02 but not using.   September 08, 2008 post hosp fu no problem with doe walked at [facility] 2/11 did ok except legs weak. no cough. rec use spiriva as maint plus as needed albuterol, feels the albuterol really makes a difference.   October 09, 2008 returns for PFTs as requested stating he still feels the need for using albuterol at least two or 3 times a week. However, he is more limited by orthopedic constraints at this point than dyspnea.  rec: stop spiriva - think of it like high octane fuel - it will give you better perfomormance if used regularly each am but you may not need it.  Try Symbicort 160 2 puffs first thing in am and 2 puffs again in pm about 12 hours later and work on perfecting technique  Please schedule a follow-up appointment in 6 weeks, sooner if needed  use 02 at bedtime automatically as needed during the day   November 20, 2008 better, just using symbicort in am and no spiriva at all, no other rx. no change in rx.   Mar 12, 2009 [facility] admit for [facility]   April 23, 2009 post op f/u f/u doing outpt rehab  rec  Work on inhaler technique: relax and blow all the way out then take a nice smooth deep breath back in, triggering the inhaler at same time you start breathing in  wear 02 automatically at bedtime for now at 2 lpm    August 20, 2009 does fine flat on flat surface trouble with inclines, no cough, no cp. Only uses symbicort 160 2 puffs each am no prns or pm use of symbicort. rec no change rx   Admit [facility]  DATE OF ADMISSION: 02/07/2011  DATE OF DISCHARGE: 02/13/2011   DISCHARGE DIAGNOSES:  1. Recurrent atrial flutter status TEE guided  cardioversion.  2. Pulmonary embolism/pulmonary infarct and shortness of breath.  3. Chronic obstructive pulmonary disease.  4. 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  5. Pulmonary hypertension.  6. Benign prostatic hypertrophy and bladder spasm.  7. Chronic back pain.  8. Diastolic and systolic congestive heart failure, chronic, stable  and currently compensated.  9. History of left hip arthroplasty.  10.Insomnia.  11.A spiculated area on the patient's right lung seen on CT that will  require further radiologic studies for follow-up over the next 30  to 60 days.  03/11/2011 ov/Reva Pinkley cc doe back to baseline, not limiting him from desired activities uses HC parking very little walking more than that, only using symbicort in am,  rec Ok to use symbicort 2 puffs every 12 hours as need when your breathing is limiting you from desired activities    07/01/2011 f/u ov/Dravin Lance [place] office cc  Indolent onset sob x months but only when bending over, activity limited by back and knee pain, not sob.  No cough, assoc with progressive wt gain he attributes to immobility.  rec Please schedule a follow up visit in 6 months but call sooner if needed if breathing worsens Weight control is simply a matter of calorie balance    03/02/2012 f/u ov/Dail Lerew still on  symbicort 160 2bid but only uses as needed.  No unusual cough, purulent sputum or sinus/hb symptoms on present rx. Limited by back, not breathing - no need for saba.     Sleeping ok without nocturnal  or early am exac of resp c/o's or need for noct saba.  Pt denies any significant sore throat, dysphagia, itching, sneezing,  nasal congestion or excess/ purulent secretions,  fever, chills, sweats, unintended wt loss, pleuritic or exertional cp, hempoptysis, orthopnea pnd or leg swelling.    Also denies any obvious fluctuation of symptoms with weather or environmental changes or other aggravating or alleviating factors.     Past  Medical History:  ATRIAL FLUTTER (ICD-427.32)  -Echo 09/17/2008 nl ef, mild lae, rv mod dilated, pasystolic 46  -Noct desat 02/28/2009, refused 02 @[facility]   COPD..............................................................................................Marland KitchenWert  - PFTs 10/09/08 FEV1 1.39 ( 44%)ratio 40, DLCO 63%. 16% improvement after bronchodilator  - Overnight pulse ox 02/28/2009 5:19m spent with sat < 89 sleeping > declined further 02 July 05, 2009  - HFA 75% April 23, 2009  SPN right mid zone  - first detected 08/16/08  - See CT 08/16/08 [facility] > see repeat CT 02/07/11  Weight Gain  - Target wt = 196 for BMI < 30        Objective:   Physical Exam wt 214 September 08, 2008 > 228 February 16, 2009 >  > 232 August 20, 2009 > 214 03/11/2011 > 208 04/28/2011 > 222 07/01/2011 > 03/02/2012 221 HEENT mild turbinate edema. Oropharynx no thrush or excess pnd or cobblestoning. No JVD or cervical adenopathy. Mild accessory muscle hypertrophy. Trachea midline, nl thryroid. Chest was hyperinflated by percussion with diminished breath sounds and marked increased exp time without wheeze. Hoover sign positive onset of inspiration. Regular rate and rhythm without murmur gallop or rub or increase P2. No edema. Decrease s1s2 Abd: no hsm, nl excursion. Ext warm without Cynosis or clubbing    CXR  07/01/2011 :  Minimal band like atx RLL / RML, no mass or effusion   Assessment:         Plan:

## 2012-03-05 DIAGNOSIS — I5032 Chronic diastolic (congestive) heart failure: Secondary | ICD-10-CM

## 2012-03-05 DIAGNOSIS — J449 Chronic obstructive pulmonary disease, unspecified: Secondary | ICD-10-CM

## 2012-03-05 DIAGNOSIS — Z7901 Long term (current) use of anticoagulants: Secondary | ICD-10-CM

## 2012-03-05 DIAGNOSIS — J4489 Other specified chronic obstructive pulmonary disease: Secondary | ICD-10-CM

## 2012-04-09 ENCOUNTER — Emergency Department (HOSPITAL_COMMUNITY): Payer: Medicare Other

## 2012-04-09 ENCOUNTER — Inpatient Hospital Stay (HOSPITAL_COMMUNITY)
Admission: EM | Admit: 2012-04-09 | Discharge: 2012-04-11 | DRG: 194 | Disposition: A | Discharge status: Home / Self Care | Payer: Medicare Other | Attending: Internal Medicine | Admitting: Internal Medicine

## 2012-04-09 ENCOUNTER — Encounter (HOSPITAL_COMMUNITY): Payer: Self-pay | Admitting: *Deleted

## 2012-04-09 DIAGNOSIS — T148XXA Other injury of unspecified body region, initial encounter: Secondary | ICD-10-CM

## 2012-04-09 DIAGNOSIS — I2699 Other pulmonary embolism without acute cor pulmonale: Secondary | ICD-10-CM

## 2012-04-09 DIAGNOSIS — I10 Essential (primary) hypertension: Secondary | ICD-10-CM | POA: Diagnosis present

## 2012-04-09 DIAGNOSIS — Z96649 Presence of unspecified artificial hip joint: Secondary | ICD-10-CM

## 2012-04-09 DIAGNOSIS — I509 Heart failure, unspecified: Secondary | ICD-10-CM | POA: Diagnosis present

## 2012-04-09 DIAGNOSIS — I5032 Chronic diastolic (congestive) heart failure: Secondary | ICD-10-CM | POA: Diagnosis present

## 2012-04-09 DIAGNOSIS — I2789 Other specified pulmonary heart diseases: Secondary | ICD-10-CM

## 2012-04-09 DIAGNOSIS — Z87891 Personal history of nicotine dependence: Secondary | ICD-10-CM

## 2012-04-09 DIAGNOSIS — I483 Typical atrial flutter: Secondary | ICD-10-CM | POA: Diagnosis present

## 2012-04-09 DIAGNOSIS — R079 Chest pain, unspecified: Secondary | ICD-10-CM

## 2012-04-09 DIAGNOSIS — J449 Chronic obstructive pulmonary disease, unspecified: Secondary | ICD-10-CM | POA: Diagnosis present

## 2012-04-09 DIAGNOSIS — Z86711 Personal history of pulmonary embolism: Secondary | ICD-10-CM

## 2012-04-09 DIAGNOSIS — L0390 Cellulitis, unspecified: Secondary | ICD-10-CM

## 2012-04-09 DIAGNOSIS — J189 Pneumonia, unspecified organism: Principal | ICD-10-CM | POA: Diagnosis present

## 2012-04-09 DIAGNOSIS — I739 Peripheral vascular disease, unspecified: Secondary | ICD-10-CM

## 2012-04-09 DIAGNOSIS — J984 Other disorders of lung: Secondary | ICD-10-CM

## 2012-04-09 DIAGNOSIS — I4892 Unspecified atrial flutter: Secondary | ICD-10-CM

## 2012-04-09 DIAGNOSIS — R0600 Dyspnea, unspecified: Secondary | ICD-10-CM

## 2012-04-09 DIAGNOSIS — J4489 Other specified chronic obstructive pulmonary disease: Secondary | ICD-10-CM

## 2012-04-09 DIAGNOSIS — Z79899 Other long term (current) drug therapy: Secondary | ICD-10-CM

## 2012-04-09 DIAGNOSIS — Z7901 Long term (current) use of anticoagulants: Secondary | ICD-10-CM

## 2012-04-09 HISTORY — DX: Benign prostatic hyperplasia without lower urinary tract symptoms: N40.0

## 2012-04-09 LAB — TROPONIN I: Troponin I: <0.3 ng/mL (ref <0.30)

## 2012-04-09 LAB — BLOOD GAS, ARTERIAL
Acid-Base Excess: 3.4 mmol/L — ABNORMAL HIGH (ref 0.0–2.0)
O2 Saturation: 92.1 %
Patient temperature: 37
TCO2: 22.9 mmol/L (ref 0–100)
pCO2 arterial: 36.8 mmHg (ref 35.0–45.0)

## 2012-04-09 LAB — CBC WITH DIFFERENTIAL/PLATELET
Eosinophils Absolute: 0 10*3/uL (ref 0.0–0.7)
Hemoglobin: 15.6 g/dL (ref 13.0–17.0)
Lymphs Abs: 0.6 10*3/uL — ABNORMAL LOW (ref 0.7–4.0)
MCH: 29.4 pg (ref 26.0–34.0)
MCV: 88.9 fL (ref 78.0–100.0)
Monocytes Relative: 6 % (ref 3–12)
Neutrophils Relative %: 88 % — ABNORMAL HIGH (ref 43–77)
RBC: 5.3 MIL/uL (ref 4.22–5.81)

## 2012-04-09 LAB — BASIC METABOLIC PANEL
BUN: 16 mg/dL (ref 6–23)
CO2: 26 mEq/L (ref 19–32)
GFR calc non Af Amer: >90 mL/min (ref >90)
Glucose, Bld: 130 mg/dL — ABNORMAL HIGH (ref 70–99)
Potassium: 3.8 mEq/L (ref 3.5–5.1)

## 2012-04-09 MED ORDER — DEXTROSE 5 % IV SOLN
500.0000 mg | INTRAVENOUS | Status: DC
  Administered 2012-04-09 – 2012-04-10 (×2): 500 mg via INTRAVENOUS
  Filled 2012-04-09 (×3): qty 500

## 2012-04-09 MED ORDER — LEVOFLOXACIN IN D5W 750 MG/150ML IV SOLN
750.0000 mg | Freq: Once | INTRAVENOUS | Status: AC
  Administered 2012-04-09: 750 mg via INTRAVENOUS
  Filled 2012-04-09: qty 150

## 2012-04-09 MED ORDER — SALINE SPRAY 0.65 % NA SOLN
1.0000 | NASAL | Status: DC | PRN
  Filled 2012-04-09: qty 44

## 2012-04-09 MED ORDER — SALINE NASAL SPRAY 0.65 % NA SOLN
1.0000 | NASAL | Status: DC | PRN
Start: 2012-04-09 — End: 2012-04-09

## 2012-04-09 MED ORDER — WARFARIN SODIUM 2.5 MG PO TABS
2.5000 mg | ORAL_TABLET | Freq: Once | ORAL | Status: DC
  Filled 2012-04-09: qty 1

## 2012-04-09 MED ORDER — LEVOFLOXACIN IN D5W 750 MG/150ML IV SOLN
INTRAVENOUS | Status: AC
  Filled 2012-04-09: qty 150

## 2012-04-09 MED ORDER — BUDESONIDE-FORMOTEROL FUMARATE 160-4.5 MCG/ACT IN AERO
2.0000 | INHALATION_SPRAY | Freq: Two times a day (BID) | RESPIRATORY_TRACT | Status: DC
  Administered 2012-04-09 – 2012-04-11 (×4): 2 via RESPIRATORY_TRACT
  Filled 2012-04-09: qty 6

## 2012-04-09 MED ORDER — DEXTROSE 5 % IV SOLN
INTRAVENOUS | Status: AC
  Filled 2012-04-09: qty 10

## 2012-04-09 MED ORDER — TAMSULOSIN HCL 0.4 MG PO CAPS
0.4000 mg | ORAL_CAPSULE | Freq: Every day | ORAL | Status: DC
  Administered 2012-04-09 – 2012-04-10 (×2): 0.4 mg via ORAL
  Filled 2012-04-09 (×2): qty 1

## 2012-04-09 MED ORDER — ZOLPIDEM TARTRATE 5 MG PO TABS: 10.0000 mg | ORAL_TABLET | Freq: Every day | ORAL | Status: DC

## 2012-04-09 MED ORDER — WARFARIN - PHARMACIST DOSING INPATIENT: Freq: Every day | Status: DC

## 2012-04-09 MED ORDER — PANTOPRAZOLE SODIUM 40 MG PO TBEC
40.0000 mg | DELAYED_RELEASE_TABLET | Freq: Every day | ORAL | Status: DC
  Administered 2012-04-10: 40 mg via ORAL
  Filled 2012-04-09: qty 1

## 2012-04-09 MED ORDER — OXYBUTYNIN CHLORIDE 5 MG PO TABS
5.0000 mg | ORAL_TABLET | Freq: Every day | ORAL | Status: DC
  Administered 2012-04-09 – 2012-04-10 (×2): 5 mg via ORAL
  Filled 2012-04-09 (×2): qty 1

## 2012-04-09 MED ORDER — DEXTROSE 5 % IV SOLN
1.0000 g | INTRAVENOUS | Status: DC
  Administered 2012-04-09: 1 g via INTRAVENOUS
  Administered 2012-04-10: 05:00:00 via INTRAVENOUS
  Administered 2012-04-10: 1 g via INTRAVENOUS
  Filled 2012-04-09 (×3): qty 10

## 2012-04-09 MED ORDER — IOHEXOL 350 MG/ML SOLN
100.0000 mL | Freq: Once | INTRAVENOUS | Status: AC | PRN
  Administered 2012-04-09: 100 mL via INTRAVENOUS

## 2012-04-09 MED ORDER — SODIUM CHLORIDE 0.9 % IJ SOLN
3.0000 mL | Freq: Two times a day (BID) | INTRAMUSCULAR | Status: DC
  Administered 2012-04-09 – 2012-04-10 (×2): 3 mL via INTRAVENOUS
  Filled 2012-04-09 (×3): qty 3

## 2012-04-09 MED ORDER — ONDANSETRON HCL 4 MG PO TABS: 4.0000 mg | ORAL_TABLET | Freq: Four times a day (QID) | ORAL | Status: DC | PRN

## 2012-04-09 MED ORDER — WARFARIN SODIUM 2.5 MG PO TABS: 2.5000 mg | ORAL_TABLET | Freq: Every day | ORAL | Status: DC

## 2012-04-09 MED ORDER — DEXTROSE 5 % IV SOLN
1.0000 g | INTRAVENOUS | Status: DC
  Filled 2012-04-09 (×3): qty 10

## 2012-04-09 MED ORDER — HYDROCODONE-ACETAMINOPHEN 5-325 MG PO TABS
1.0000 | ORAL_TABLET | ORAL | Status: DC | PRN
  Administered 2012-04-09 – 2012-04-11 (×6): 1 via ORAL
  Filled 2012-04-09 (×7): qty 1

## 2012-04-09 MED ORDER — BUDESONIDE-FORMOTEROL FUMARATE 160-4.5 MCG/ACT IN AERO
INHALATION_SPRAY | RESPIRATORY_TRACT | Status: AC
  Filled 2012-04-09: qty 6

## 2012-04-09 MED ORDER — ONDANSETRON HCL 4 MG/2ML IJ SOLN: 4.0000 mg | Freq: Four times a day (QID) | INTRAMUSCULAR | Status: DC | PRN

## 2012-04-09 MED ORDER — TORSEMIDE 20 MG PO TABS
20.0000 mg | ORAL_TABLET | Freq: Two times a day (BID) | ORAL | Status: DC
  Filled 2012-04-09 (×3): qty 1

## 2012-04-09 MED ORDER — POTASSIUM CHLORIDE 20 MEQ/15ML (10%) PO LIQD
7.5000 meq | Freq: Every day | ORAL | Status: DC
  Administered 2012-04-09 – 2012-04-10 (×2): 7.5 meq via ORAL
  Filled 2012-04-09: qty 30

## 2012-04-09 MED ORDER — ZOLPIDEM TARTRATE 5 MG PO TABS
5.0000 mg | ORAL_TABLET | Freq: Every day | ORAL | Status: DC
  Administered 2012-04-09 – 2012-04-10 (×2): 5 mg via ORAL
  Filled 2012-04-09 (×2): qty 1

## 2012-04-09 MED ORDER — LEVOFLOXACIN IN D5W 750 MG/150ML IV SOLN: 750.0000 mg | Freq: Once | INTRAVENOUS | Status: DC

## 2012-04-09 MED ORDER — DEXTROSE 5 % IV SOLN
1.0000 g | Freq: Once | INTRAVENOUS | Status: DC
  Filled 2012-04-09: qty 10

## 2012-04-09 NOTE — H&P (Signed)
[facility] History and Physical  Maron Stanzione ZOX:[id number] DOB: [birth year] DOA: 04/09/2012   PCP: Josue Hector, MD   Chief Complaint: Right-sided chest pain, hemoptysis, dyspnea.  HPI: Francisco Tanner is a 66 y.o. male who presents with the above symptoms since approximately 10 AM this morning. He denies any fever. The hemoptysis did not involve any purulent sputum apparently. He has a history of COPD. He sees a pulmonologist, Dr. Sherene Sires in [place]. There is no history of weight loss. He requires no oxygen at home for his COPD. He quit smoking over 12 years ago.   Review of Systems:   Apart from history of present illness, other systems negative.  Past Medical History  Diagnosis Date  . COPD (chronic obstructive pulmonary disease)     Wert. PFTs 10/09/08 FEV1 1.39 (44%) ratio 40, DLCO 63%. 16% improvement after bronchodilator. overnight pulse ox 02/28/09 5:51m pent with sat< 89 sleeping > declined further o2 07/05/09. HFA 75% 04/23/09.   [name] Atrial flutter     a. echo 09/28/11 EF 50-55%, mild RAE, RV mildly dilated, systolic fxn mild-mod reduced;  b. 01/2012 s/p EPS & RFCA.  [name] Abnormal CT scan     SPN right mid zone. first detected 08/16/08; see [facility]   . Weight gain     target weight 196; BMI <30  . Lung nodule   . Hypertension   . Chronic diastolic CHF (congestive heart failure)   . Prostate enlargement    Past Surgical History  Procedure Date  . Hip replacement surgery     L   . Kyphosis surgery   . Back surgery     x4  . Knee surgery   . Cardioversion     in past  . Incision and drainage hip 09/24/2011    Procedure: IRRIGATION AND DEBRIDEMENT HIP;  Surgeon: Loanne Drilling, MD;  Location: [facility] ORS;  Service: Orthopedics;  Laterality: Left;  . Joint replacement    Social History:  Patient lives alone, currently separated. He quit smoking 12 years ago. He does not drink alcohol regular basis. He is retired.   No Known Allergies  Family History  Problem Relation  Age of Onset  . Cancer Neg Hx   . Diabetes Father     DM     Prior to Admission medications   Medication Sig Start Date End Date Taking? Authorizing Provider  budesonide-formoterol (SYMBICORT) 160-4.5 MCG/ACT inhaler Inhale 2 puffs into the lungs 2 (two) times daily.    Yes Historical Provider, MD  HYDROcodone-acetaminophen (VICODIN) 5-500 MG per tablet Take 1 tablet by mouth every 6 (six) hours as needed. For hip pain/pain    Yes Historical Provider, MD  omeprazole (PRILOSEC) 20 MG capsule Take 20 mg by mouth daily.   Yes Historical Provider, MD  oxybutynin (DITROPAN) 5 MG tablet Take 5 mg by mouth at bedtime.   Yes Historical Provider, MD  potassium chloride 20 MEQ/15ML (10%) solution Take 7.5 mEq by mouth daily.   Yes Historical Provider, MD  sodium chloride (OCEAN) 0.65 % nasal spray Place 1 spray into the nose as needed. For dryness   Yes Historical Provider, MD  Tamsulosin HCl (FLOMAX) 0.4 MG CAPS Take 0.4 mg by mouth at bedtime.     Yes Historical Provider, MD  torsemide (DEMADEX) 20 MG tablet Take 1 tablet (20 mg total) by mouth 2 (two) times daily. 02/03/12  Yes Ok Anis, NP  warfarin (COUMADIN) 5 MG tablet Take 2.5-5 mg by mouth daily. Take  1 tablet (5mg ) on Tuesday and Thursday. On Monday, Wednesday, Friday, Saturday, and Sunday, take 2.5 mg, one half tablet.    Yes Historical Provider, MD  zolpidem (AMBIEN) 10 MG tablet Take 10 mg by mouth at bedtime.    Yes Historical Provider, MD  potassium chloride 20 MEQ/15ML (10%) solution Take 7.5 mLs (10 mEq total) by mouth daily. 02/03/12 03/04/12  Ok Anis, NP   Physical Exam: Filed Vitals:   04/09/12 1413 04/09/12 1500 04/09/12 1600  BP: 139/80 128/75 132/72  Pulse: 102 94 102  Temp: 98.4 F (36.9 C)    TempSrc: Oral    Resp:  11 22  Height: 5' 7.5" (1.715 m)    Weight: 101.606 kg (224 lb)    SpO2: 94% 96% 95%     General:  Looks systemically well. Does not appear to be dyspneic at rest. There is no peripheral  or central cyanosis.  Eyes: No jaundice. No pallor.  ENT: No abnormalities.  Neck: No lymphadenopathy.  Cardiovascular: Heart sounds are present and normal, appears to be in sinus rhythm at the present time.  Respiratory: Lung fields are actually clear anteriorly and posteriorly with perhaps reduced air entry in the right upper and mid zones.  Abdomen: Soft, nontender. No masses.  Skin: No rash.  Musculoskeletal: He has a very painful left hip which is chronic.  Psychiatric: Appropriate affect.  Neurologic: Alert and orientated without any focal neurological signs.  Labs on Admission:  Basic Metabolic Panel:  Lab 04/09/12 1610  NA 137  K 3.8  CL 98  CO2 26  GLUCOSE 130*  BUN 16  CREATININE 0.70  CALCIUM 9.4  MG --  PHOS --       CBC:  Lab 04/09/12 1432  WBC 10.3  NEUTROABS 9.1*  HGB 15.6  HCT 47.1  MCV 88.9  PLT 154   Cardiac Enzymes:  Lab 04/09/12 1432  CKTOTAL --  CKMB --  CKMBINDEX --  TROPONINI <0.30     Radiological Exams on Admission: Ct Angio Chest Pe W/cm &/or Wo Cm  04/09/2012  *RADIOLOGY REPORT*  Clinical Data: Right-sided chest pain.  Hemoptysis.  History of pulmonary emboli.  CT ANGIOGRAPHY CHEST  Technique:  Multidetector CT imaging of the chest using the standard protocol during bolus administration of intravenous contrast. Multiplanar reconstructed images including MIPs were obtained and reviewed to evaluate the vascular anatomy.  Contrast: OMNIPAQUE IOHEXOL 350 MG/ML SOLN  Comparison: CTA chest 02/07/2011, 10/27/2009.  Findings: Contrast opacification of the pulmonary arteries is good. No filling defects within either main pulmonary artery or their branches in either lung to suggest pulmonary embolism.  Heart size normal with left ventricular hypertrophy.  Very small pericardial effusion in the superior recess.  Moderate to severe LAD and left circumflex coronary artery calcification.  Moderate to severe atherosclerosis involving  the thoracic and upper abdominal aorta without aneurysm or dissection.  Proximal great vessels atherosclerotic though patent.  Airspace consolidation with air bronchograms in the right upper lobe.  Emphysematous changes in the upper lobes.  Minimal scarring in the deep lower lobes and lingula.  Pulmonary parenchyma otherwise clear.  No pleural effusions.  Central airways patent with calcification of the tracheobronchial cartilages.  Scattered normal-sized mediastinal, hilar, and axillary lymph nodes; no significant lymphadenopathy.  Visualized upper abdomen unremarkable for the early arterial phase of enhancement.  Bone window images demonstrate thoracic spondylosis.  IMPRESSION:  1.  No evidence of pulmonary embolism. 2.  Right upper lobe pneumonia superimposed upon  COPD/emphysema. No central obstructing mass.   Original Report Authenticated By: Arnell Sieving, M.D.    Dg Chest Port 1 View  04/09/2012  *RADIOLOGY REPORT*  Clinical Data: Hemoptysis, shortness of breath.  On anticoagulation for previous pulmonary embolism.  PORTABLE CHEST - 1 VIEW  Comparison: Most recent chest radiograph 09/27/2011 and chest CT 02/07/2011  Findings: New right upper lobe consolidation and volume loss noted with upper retraction of the minor fissure.  Patchy left lower lobe consolidation also noted.  Left costophrenic angle omitted the film view.  No pleural effusion.  Heart size is normal. Aorta is ectatic and unfolded.  No pneumothorax.  IMPRESSION: Right upper lobe consolidation and volume loss.  This could represent interval scarring from previous pulmonary embolism, however, central obstructing mass lesion or pneumonia could have a similar appearance.  Less prominent left lower lobe opacity also noted.  Depending on the clinical presentation, options include follow-up PA and lateral chest radiographs if symptoms suggest pneumonia or chest CT with IV contrast according to pulmonary embolism protocol for further evaluation.    Original Report Authenticated By: Harrel Lemon, M.D.       Assessment/Plan   1. Right upper lobe pneumonia superimposed upon COPD. No central obstructing mass. 2. Atrial flutter, on long-term anticoagulation with Coumadin. 3. Hypertension. 4. COPD, stable. 5. Chronic diastolic heart failure, compensated at present. Plan: 1. Admit to telemetry. 2. Intravenous antibiotics. 3. Supplemental oxygen as required. Further recommendations will depend on patient's hospital progress.  Code Status: Full code. Family Communication: Discuss plan with patient at bedside. Disposition Plan: Home when medically stable.  Time spent: 30 minutes.  Wilson Singer [facility] Pager [phone].  If 7PM-7AM, please contact night-coverage [email or web address] Password [email or web address] 04/09/2012, 6:01 PM

## 2012-04-09 NOTE — ED Notes (Signed)
Pt presents with C/o hemoptysis, and rt chest pain that begun today. Pt states drove himself from home to ED. Pt has history of blood clots and reports he takes a blood thinner daily.

## 2012-04-09 NOTE — ED Notes (Signed)
Water given  

## 2012-04-09 NOTE — ED Notes (Signed)
Pt hooked up on heart monitor.

## 2012-04-09 NOTE — ED Provider Notes (Signed)
History   This chart was scribed for Francisco Chad, MD by [name]. The patient was seen in room APA08/APA08. Patient's care was started at 1400.  CSN: [id number]  Arrival date & time 04/09/12  1400   First MD Initiated Contact with Patient 04/09/12 1421      Chief Complaint  Patient presents with  . Chest Pain   Patient is a 66 y.o. male presenting with chest pain. The history is provided by the patient. No language interpreter was used.  Chest Pain The pain is associated with coughing. The severity of the pain is moderate. The quality of the pain is described as dull. The pain does not radiate. Primary symptoms include shortness of breath and cough. Pertinent negatives for primary symptoms include no fever, no fatigue, no wheezing, no abdominal pain, no nausea and no vomiting.  Pertinent negatives for associated symptoms include no numbness.  His past medical history is significant for CHF, hypertension and PE.    Francisco Tanner is a 66 y.o. male with a h/o  COPD, CHF, and pneumonia  who presents to the Emergency Department complaining of 6 hours of sudden onset moderate constant worsening right side chest pain. Pt was healthy until this yesterday when he began sudden onset chest pain described as muscle strain. Pt endorses associated mild hemoptysis. Pain is consistent with previous blood clots. Had ablation one month ago by Dr. Sherryl Manges. Pt denies fever, emesis, and nausea.  Pt lists Dr. Lysbeth Galas as PCP and Cardiologist Dr. Donn Pierini  Past Medical History  Diagnosis Date  . COPD (chronic obstructive pulmonary disease)     Wert. PFTs 10/09/08 FEV1 1.39 (44%) ratio 40, DLCO 63%. 16% improvement after bronchodilator. overnight pulse ox 02/28/09 5:96m pent with sat< 89 sleeping > declined further o2 07/05/09. HFA 75% 04/23/09.   [name] Atrial flutter     a. echo 09/28/11 EF 50-55%, mild RAE, RV mildly dilated, systolic fxn mild-mod reduced;  b. 01/2012 s/p EPS & RFCA.  [name] Abnormal CT scan     SPN  right mid zone. first detected 08/16/08; see CT [facility]   . Weight gain     target weight 196; BMI <30  . Lung nodule   . Hypertension   . Chronic diastolic CHF (congestive heart failure)   . Prostate enlargement     Past Surgical History  Procedure Date  . Hip replacement surgery     L   . Kyphosis surgery   . Back surgery     x4  . Knee surgery   . Cardioversion     in past  . Incision and drainage hip 09/24/2011    Procedure: IRRIGATION AND DEBRIDEMENT HIP;  Surgeon: Loanne Drilling, MD;  Location: [facility]S;  Service: Orthopedics;  Laterality: Left;  . Joint replacement     Family History  Problem Relation Age of Onset  . Cancer Neg Hx   . Diabetes Father     DM     History  Substance Use Topics  . Smoking status: Former Smoker -- 0.5 packs/day for 20 years    Types: Cigarettes    Quit date: 07/28/2002  . Smokeless tobacco: Never Used  . Alcohol Use: Yes    Review of Systems  Constitutional: Negative for fever, chills, activity change, appetite change and fatigue.       10 Systems reviewed and are negative for acute change except as noted in the HPI.  HENT: Negative for congestion, trouble swallowing and neck pain.  Eyes: Negative for discharge and redness.  Respiratory: Positive for cough, chest tightness and shortness of breath. Negative for apnea, choking and wheezing.   Cardiovascular: Positive for chest pain.  Gastrointestinal: Negative for nausea, vomiting, abdominal pain and diarrhea.  Genitourinary: Negative for flank pain.  Musculoskeletal: Negative for back pain and joint swelling.       Chronic back and hip pain - no acute change  Skin: Negative for color change, pallor and rash.  Neurological: Negative for syncope, numbness and headaches.  Hematological: Negative.   Psychiatric/Behavioral: Negative.        No behavior change.   Allergies  Review of patient's allergies indicates no known allergies.  Home Medications   Current Outpatient Rx  Name  Route Sig Dispense Refill  . BUDESONIDE-FORMOTEROL FUMARATE 160-4.5 MCG/ACT IN AERO Inhalation Inhale 2 puffs into the lungs 2 (two) times daily.     [name] HYDROCODONE-ACETAMINOPHEN 5-500 MG PO TABS Oral Take 1 tablet by mouth every 6 (six) hours as needed. For hip pain/pain     . OMEPRAZOLE 20 MG PO CPDR Oral Take 20 mg by mouth daily.    . OXYBUTYNIN CHLORIDE 5 MG PO TABS Oral Take 5 mg by mouth at bedtime.    [name] POTASSIUM CHLORIDE 20 MEQ/15ML (10%) PO LIQD Oral Take 7.5 mEq by mouth daily.    [name] SALINE NASAL SPRAY 0.65 % NA SOLN Nasal Place 1 spray into the nose as needed. For dryness    . TAMSULOSIN HCL 0.4 MG PO CAPS Oral Take 0.4 mg by mouth at bedtime.      . TORSEMIDE 20 MG PO TABS Oral Take 1 tablet (20 mg total) by mouth 2 (two) times daily. 60 tablet 6  . WARFARIN SODIUM 5 MG PO TABS Oral Take 2.5-5 mg by mouth daily. Take 1 tablet (5mg ) on Tuesday and Thursday. On Monday, Wednesday, Friday, Saturday, and Sunday, take 2.5 mg, one half tablet.     [name] ZOLPIDEM TARTRATE 10 MG PO TABS Oral Take 10 mg by mouth at bedtime.     [name] POTASSIUM CHLORIDE 20 MEQ/15ML (10%) PO LIQD Oral Take 7.5 mLs (10 mEq total) by mouth daily. 500 mL 1    BP 139/80  Pulse 102  Temp 98.4 F (36.9 C) (Oral)  Ht 5' 7.5" (1.715 m)  Wt 224 lb (101.606 kg)  BMI 34.57 kg/m2  SpO2 94%  Physical Exam  Nursing note and vitals reviewed. Constitutional: He is oriented to person, place, and time. He appears well-developed and well-nourished. No distress.       Visible dyspnea. Normal saturation levels.  HENT:  Head: Normocephalic and atraumatic.  Right Ear: External ear normal.  Left Ear: External ear normal.  Nose: Nose normal.  Mouth/Throat: Oropharynx is clear and moist. No oropharyngeal exudate.  Eyes: Conjunctivae normal and EOM are normal. Pupils are equal, round, and reactive to light. Right eye exhibits no discharge. Left eye exhibits no discharge. No scleral icterus.  Neck: Normal range of motion. Neck supple.  No JVD present. No tracheal deviation present. No thyromegaly present.  Cardiovascular: Normal rate, regular rhythm and intact distal pulses.  Exam reveals no gallop and no friction rub.   No murmur heard. Pulmonary/Chest: Effort normal. No stridor.       Bilateral wheezing. Rhonchi hear loudest at the right upper boarder.  Abdominal: Soft. Bowel sounds are normal. He exhibits no distension and no mass. There is no tenderness. There is no rebound and no guarding.  Musculoskeletal: Normal range  of motion. He exhibits no edema and no tenderness.       Extremitis ditaly vascularly intact. Calves non-tender.  Lymphadenopathy:    He has no cervical adenopathy.  Neurological: He is alert and oriented to person, place, and time.  Skin: Skin is warm and dry. No rash noted. He is not diaphoretic. No erythema. No pallor.  Psychiatric: He has a normal mood and affect. His behavior is normal.    ED Course  Procedures (including critical care time) DIAGNOSTIC STUDIES: Oxygen Saturation is 94% on room air, adequate by my interpretation.    COORDINATION OF CARE: 1423- Ordered CBC with Differential Once. 1423- Ordered Troponin I STAT. 1423- Ordered DG Chest Port 1 View 1 time imaging. 1508- Evaluated Pt. Pt is awake, alert, and oriented. 1520- OrderedCT Angio Chest PE W/Cm &/Or Wo Cm 1 time imaging. 1520- Ordered Blood gas, arterial Once. 1522- Ordered Protime-INR STAT.  Labs Reviewed  CBC WITH DIFFERENTIAL - Abnormal; Notable for the following:    RDW 16.9 (*)     Neutrophils Relative 88 (*)     Neutro Abs 9.1 (*)     Lymphocytes Relative 6 (*)     Lymphs Abs 0.6 (*)     All other components within normal limits  BASIC METABOLIC PANEL - Abnormal; Notable for the following:    Glucose, Bld 130 (*)     All other components within normal limits  BLOOD GAS, ARTERIAL - Abnormal; Notable for the following:    pH, Arterial 7.477 (*)     pO2, Arterial 61.1 (*)     Bicarbonate 26.9 (*)      Acid-Base Excess 3.4 (*)     All other components within normal limits  PROTIME-INR - Abnormal; Notable for the following:    Prothrombin Time 28.8 (*)     INR 2.66 (*)     All other components within normal limits  TROPONIN I  CULTURE, BLOOD (ROUTINE X 2)  CULTURE, BLOOD (ROUTINE X 2)  CULTURE, EXPECTORATED SPUTUM-ASSESSMENT   Ct Angio Chest Pe W/cm &/or Wo Cm  04/09/2012  *RADIOLOGY REPORT*  Clinical Data: Right-sided chest pain.  Hemoptysis.  History of pulmonary emboli.  CT ANGIOGRAPHY CHEST  Technique:  Multidetector CT imaging of the chest using the standard protocol during bolus administration of intravenous contrast. Multiplanar reconstructed images including MIPs were obtained and reviewed to evaluate the vascular anatomy.  Contrast: OMNIPAQUE IOHEXOL 350 MG/ML SOLN  Comparison: CTA chest 02/07/2011, 10/27/2009.  Findings: Contrast opacification of the pulmonary arteries is good. No filling defects within either main pulmonary artery or their branches in either lung to suggest pulmonary embolism.  Heart size normal with left ventricular hypertrophy.  Very small pericardial effusion in the superior recess.  Moderate to severe LAD and left circumflex coronary artery calcification.  Moderate to severe atherosclerosis involving the thoracic and upper abdominal aorta without aneurysm or dissection.  Proximal great vessels atherosclerotic though patent.  Airspace consolidation with air bronchograms in the right upper lobe.  Emphysematous changes in the upper lobes.  Minimal scarring in the deep lower lobes and lingula.  Pulmonary parenchyma otherwise clear.  No pleural effusions.  Central airways patent with calcification of the tracheobronchial cartilages.  Scattered normal-sized mediastinal, hilar, and axillary lymph nodes; no significant lymphadenopathy.  Visualized upper abdomen unremarkable for the early arterial phase of enhancement.  Bone window images demonstrate thoracic spondylosis.   IMPRESSION:  1.  No evidence of pulmonary embolism. 2.  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 No central obstructing mass.   Original Report Authenticated By: Arnell Sieving, M.D.    Dg Chest Port 1 View  04/09/2012  *RADIOLOGY REPORT*  Clinical Data: Hemoptysis, shortness of breath.  On anticoagulation for previous pulmonary embolism.  PORTABLE CHEST - 1 VIEW  Comparison: Most recent chest radiograph 09/27/2011 and chest CT 02/07/2011  Findings: New right upper lobe consolidation and volume loss noted with upper retraction of the minor fissure.  Patchy left lower lobe consolidation also noted.  Left costophrenic angle omitted the film view.  No pleural effusion.  Heart size is normal. Aorta is ectatic and unfolded.  No pneumothorax.  IMPRESSION: Right upper lobe consolidation and volume loss.  This could represent interval scarring from previous pulmonary embolism, however, central obstructing mass lesion or pneumonia could have a similar appearance.  Less prominent left lower lobe opacity also noted.  Depending on the clinical presentation, options include follow-up PA and lateral chest radiographs if symptoms suggest pneumonia or chest CT with IV contrast according to pulmonary embolism protocol for further evaluation.   Original Report Authenticated By: Harrel Lemon, M.D.      No diagnosis found.  Date: 04/09/2012  Rate: 102 bpm  Rhythm: sinus tachycardia  QRS Axis: right  Intervals: widened qrs  ST/T Wave abnormalities: nonspecific ST changes  Conduction Disutrbances:right bundle branch block  Narrative Interpretation:   Old EKG Reviewed: unchanged      MDM  Pt presents for evaluation of hemoptysis and right-sided chest pain. She appears short of breath during regular conversation. Chest x-ray performed during the triage process demonstrates a right upper lobe consolidation, however he denies fever. Given previous history of pulmonary embolus feel that he  is at high risk for his symptoms today been secondary to a thromboembolic event. CTA has been ordered. Also placing supplemental oxygen via nasal cannula. Anticipate admission.  1740.  Note slightly depressed pO2 on ABG.  CTA negative for PE.  Changes appear consistent with a pneumonia.  Blood and sputum cultures ordered.  Secondary to a known hx of COPD broad abx ordered (ceftriaxone and levaquin).  Plan admit for further evaluation and treatment.  Discussed with on-call hospitalist provider.    I personally performed the services described in this documentation, which was scribed in my presence. The recorded information has been reviewed and considered.      Francisco Chad, MD 04/09/12 [id number]

## 2012-04-09 NOTE — ED Notes (Signed)
Chest pain, hemoptysis, onset this am.  Takes coumadin

## 2012-04-09 NOTE — ED Notes (Signed)
Pt placed on [place] @2LPM

## 2012-04-09 NOTE — ED Notes (Signed)
Pt c/o pain in rt hip, rt chest and lower back. Pain has increased. Vicodin offered per orders.

## 2012-04-10 LAB — COMPREHENSIVE METABOLIC PANEL
ALT: 13 U/L (ref 0–53)
AST: 15 U/L (ref 0–37)
Alkaline Phosphatase: 54 U/L (ref 39–117)
BUN: 12 mg/dL (ref 6–23)
Calcium: 9 mg/dL (ref 8.4–10.5)
Chloride: 98 mEq/L (ref 96–112)
Creatinine, Ser: 0.72 mg/dL (ref 0.50–1.35)
GFR calc Af Amer: >90 mL/min (ref >90)

## 2012-04-10 LAB — PROTIME-INR: INR: 2.39 — ABNORMAL HIGH (ref 0.00–1.49)

## 2012-04-10 LAB — CBC
HCT: 44.3 % (ref 39.0–52.0)
MCH: 29 pg (ref 26.0–34.0)
MCHC: 32.5 g/dL (ref 30.0–36.0)
RDW: 16.9 % — ABNORMAL HIGH (ref 11.5–15.5)

## 2012-04-10 MED ORDER — WARFARIN SODIUM 2.5 MG PO TABS
2.5000 mg | ORAL_TABLET | Freq: Once | ORAL | Status: AC
  Administered 2012-04-10: 2.5 mg via ORAL
  Filled 2012-04-10: qty 1

## 2012-04-10 NOTE — Progress Notes (Signed)
04/10/12 1835 Discussed rationale for SCDs as ordered with patient this afternoon, stated unable to tolerate wearing them since having recent hip surgery. Pt on coumadin as well, will leave note for physician regarding SCDs. Francisco Tanner

## 2012-04-10 NOTE — Progress Notes (Addendum)
Subjective: This man is feeling much better than when he came in yesterday. He has not really coughed up anything today. His had no fever. His had less difficulties with breathing today.           Physical Exam: Blood pressure 92/71, pulse 68, temperature 97.9 F (36.6 C), temperature source Oral, resp. rate 20, height 5\' 7"  (1.702 m), weight 101.3 kg (223 lb 5.2 oz), SpO2 97.00%. He looks systemically well. There is no increased work of breathing. There is no peripheral central cyanosis. His lung fields are actually entirely clear anteriorly and posteriorly. There is no bronchial breathing crackles or wheezing. He is alert and orientated.   Investigations:  Recent Results (from the past 240 hour(s))  CULTURE, BLOOD (ROUTINE X 2)     Status: Normal (Preliminary result)   Collection Time   04/09/12  5:26 PM      Component Value Range Status Comment   Specimen Description Blood LEFT ANTECUBITAL   Final    Special Requests BOTTLES DRAWN AEROBIC AND ANAEROBIC 6CC   Final    Culture PENDING   Incomplete    Report Status PENDING   Incomplete   CULTURE, BLOOD (ROUTINE X 2)     Status: Normal (Preliminary result)   Collection Time   04/09/12  5:31 PM      Component Value Range Status Comment   Specimen Description Blood BLOOD RIGHT HAND   Final    Special Requests BOTTLES DRAWN AEROBIC AND ANAEROBIC 5CC   Final    Culture PENDING   Incomplete    Report Status PENDING   Incomplete      Basic Metabolic Panel:  Basename 04/10/12 0551 04/09/12 1432  NA 137 137  K 3.6 3.8  CL 98 98  CO2 28 26  GLUCOSE 115* 130*  BUN 12 16  CREATININE 0.72 0.70  CALCIUM 9.0 9.4  MG -- --  PHOS -- --   Liver Function Tests:  [facility] 04/10/12 0551  AST 15  ALT 13  ALKPHOS 54  BILITOT 0.8  PROT 6.8  ALBUMIN 3.5     CBC:  Basename 04/10/12 0551 04/09/12 1432  WBC 9.6 10.3  NEUTROABS -- 9.1*  HGB 14.4 15.6  HCT 44.3 47.1  MCV 89.3 88.9  PLT 158 154    Ct Angio Chest Pe  W/cm &/or Wo Cm  04/09/2012  *RADIOLOGY REPORT*  Clinical Data: Right-sided chest pain.  Hemoptysis.  History of pulmonary emboli.  CT ANGIOGRAPHY CHEST  Technique:  Multidetector CT imaging of the chest using the standard protocol during bolus administration of intravenous contrast. Multiplanar reconstructed images including MIPs were obtained and reviewed to evaluate the vascular anatomy.  Contrast: OMNIPAQUE IOHEXOL 350 MG/ML SOLN  Comparison: CTA chest 02/07/2011, 10/27/2009.  Findings: Contrast opacification of the pulmonary arteries is good. No filling defects within either main pulmonary artery or their branches in either lung to suggest pulmonary embolism.  Heart size normal with left ventricular hypertrophy.  Very small pericardial effusion in the superior recess.  Moderate to severe LAD and left circumflex coronary artery calcification.  Moderate to severe atherosclerosis involving the thoracic and upper abdominal aorta without aneurysm or dissection.  Proximal great vessels atherosclerotic though patent.  Airspace consolidation with air bronchograms in the right upper lobe.  Emphysematous changes in the upper lobes.  Minimal scarring in the deep lower lobes and lingula.  Pulmonary parenchyma otherwise clear.  No pleural effusions.  Central airways patent with calcification of  the tracheobronchial cartilages.  Scattered normal-sized mediastinal, hilar, and axillary lymph nodes; no significant lymphadenopathy.  Visualized upper abdomen unremarkable for the early arterial phase of enhancement.  Bone window images demonstrate thoracic spondylosis.  IMPRESSION:  1.  No evidence of pulmonary embolism. 2.  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 No central obstructing mass.   Original Report Authenticated By: Arnell Sieving, M.D.    Dg Chest Port 1 View  04/09/2012  *RADIOLOGY REPORT*  Clinical Data: Hemoptysis, shortness of breath.  On anticoagulation for previous pulmonary  embolism.  PORTABLE CHEST - 1 VIEW  Comparison: Most recent chest radiograph 09/27/2011 and chest CT 02/07/2011  Findings: New right upper lobe consolidation and volume loss noted with upper retraction of the minor fissure.  Patchy left lower lobe consolidation also noted.  Left costophrenic angle omitted the film view.  No pleural effusion.  Heart size is normal. Aorta is ectatic and unfolded.  No pneumothorax.  IMPRESSION: Right upper lobe consolidation and volume loss.  This could represent interval scarring from previous pulmonary embolism, however, central obstructing mass lesion or pneumonia could have a similar appearance.  Less prominent left lower lobe opacity also noted.  Depending on the clinical presentation, options include follow-up PA and lateral chest radiographs if symptoms suggest pneumonia or chest CT with IV contrast according to pulmonary embolism protocol for further evaluation.   Original Report Authenticated By: Harrel Lemon, M.D.       Medications:  Scheduled:   . azithromycin  500 mg Intravenous Q24H  . budesonide-formoterol  2 puff Inhalation BID  . cefTRIAXone (ROCEPHIN)  IV  1 g Intravenous Q24H  . levofloxacin  750 mg Intravenous Once  . oxybutynin  5 mg Oral QHS  . pantoprazole  40 mg Oral Q1200  . potassium chloride  7.5 mEq Oral Daily  . sodium chloride  3 mL Intravenous Q12H  . Tamsulosin HCl  0.4 mg Oral QHS  . torsemide  20 mg Oral BID  . warfarin  2.5 mg Oral Once  . Warfarin - Pharmacist Dosing Inpatient   Does not apply q1800  . zolpidem  5 mg Oral QHS  . DISCONTD: cefTRIAXone (ROCEPHIN) 1 g IVPB  1 g Intravenous Once  . DISCONTD: cefTRIAXone (ROCEPHIN)  IV  1 g Intravenous Q24H  . DISCONTD: levofloxacin  750 mg Intravenous Once  . DISCONTD: warfarin  2.5-5 mg Oral Daily  . DISCONTD: zolpidem  10 mg Oral QHS    Impression: 1. Community-acquired right upper lobe pneumonia, clinically improving. 2. COPD, stable. 3. Atrial flutter, on chronic  anticoagulation therapy. 4. Hypertension. 5. Chronic diastolic heart failure, controlled/compensated.     Plan:  1. Continue w tomorrow, consider discharge home.ith intravenous antibiotics for another 24 hours. If he is doing well consider discharging home.     LOS: 1 day   Wilson Singer Pager [phone]  04/10/2012, 8:35 AM

## 2012-04-10 NOTE — Progress Notes (Signed)
04/10/12 1527 Patient up in room and up to chair this afternoon independently. Encouraged to call for assist as needed. Riccardo Dubin

## 2012-04-10 NOTE — Progress Notes (Signed)
ANTICOAGULATION CONSULT NOTE - Initial Consult  Pharmacy Consult for Warfarin Indication: atrial fibrillation  No Known Allergies  Patient Measurements: Height: 5\' 7"  (170.2 cm) Weight: 223 lb 5.2 oz (101.3 kg) IBW/kg (Calculated) : 66.1   Vital Signs: Temp: 97.9 F (36.6 C) (09/14 0535) Temp src: Oral (09/14 0535) BP: 92/71 mmHg (09/14 0535) Pulse Rate: 68  (09/14 0535)  Labs:  Basename 04/10/12 0551 04/09/12 1517 04/09/12 1432  HGB 14.4 -- 15.6  HCT 44.3 -- 47.1  PLT 158 -- 154  APTT -- -- --  LABPROT 26.5* 28.8* --  INR 2.39* 2.66* --  HEPARINUNFRC -- -- --  CREATININE 0.72 -- 0.70  CKTOTAL -- -- --  CKMB -- -- --  TROPONINI -- -- <0.30   Estimated Creatinine Clearance: 103 ml/min (by C-G formula based on Cr of 0.72).  Medical History: Past Medical History  Diagnosis Date  . COPD (chronic obstructive pulmonary disease)     Wert. PFTs 10/09/08 FEV1 1.39 (44%) ratio 40, DLCO 63%. 16% improvement after bronchodilator. overnight pulse ox 02/28/09 5:76m pent with sat< 89 sleeping > declined further o2 07/05/09. HFA 75% 04/23/09.   [name] Atrial flutter     a. echo 09/28/11 EF 50-55%, mild RAE, RV mildly dilated, systolic fxn mild-mod reduced;  b. 01/2012 s/p EPS & RFCA.  [name] Abnormal CT scan     SPN right mid zone. first detected 08/16/08; see [facility]   . Weight gain     target weight 196; BMI <30  . Lung nodule   . Hypertension   . Chronic diastolic CHF (congestive heart failure)   . Prostate enlargement    Medications:  Prescriptions prior to admission  Medication Sig Dispense Refill  . budesonide-formoterol (SYMBICORT) 160-4.5 MCG/ACT inhaler Inhale 2 puffs into the lungs 2 (two) times daily.       [name] HYDROcodone-acetaminophen (VICODIN) 5-500 MG per tablet Take 1 tablet by mouth every 6 (six) hours as needed. For hip pain/pain       . omeprazole (PRILOSEC) 20 MG capsule Take 20 mg by mouth daily.      [name] oxybutynin (DITROPAN) 5 MG tablet Take 5 mg by mouth at bedtime.        . potassium chloride 20 MEQ/15ML (10%) solution Take 7.5 mEq by mouth daily.      . sodium chloride (OCEAN) 0.65 % nasal spray Place 1 spray into the nose as needed. For dryness      . Tamsulosin HCl (FLOMAX) 0.4 MG CAPS Take 0.4 mg by mouth at bedtime.        . torsemide (DEMADEX) 20 MG tablet Take 1 tablet (20 mg total) by mouth 2 (two) times daily.  60 tablet  6  . warfarin (COUMADIN) 5 MG tablet Take 2.5-5 mg by mouth daily. Take 1 tablet (5mg ) on Tuesday and Thursday. On Monday, Wednesday, Friday, Saturday, and Sunday, take 2.5 mg, one half tablet.       [name] zolpidem (AMBIEN) 10 MG tablet Take 10 mg by mouth at bedtime.       . potassium chloride 20 MEQ/15ML (10%) solution Take 7.5 mLs (10 mEq total) by mouth daily.  500 mL  1   Assessment: INR therapeutic on admission  Goal of Therapy:  INR 2-3 Monitor platelets by anticoagulation protocol: Yes   Plan: Coumadin 2.5mg  today x 1 (home regimen) INR daily for now CBC per protocol  Valrie Hart A 04/10/2012,8:58 AM

## 2012-04-11 LAB — BASIC METABOLIC PANEL
BUN: 11 mg/dL (ref 6–23)
CO2: 27 mEq/L (ref 19–32)
Chloride: 103 mEq/L (ref 96–112)
Glucose, Bld: 93 mg/dL (ref 70–99)
Potassium: 3.8 mEq/L (ref 3.5–5.1)
Sodium: 138 mEq/L (ref 135–145)

## 2012-04-11 MED ORDER — WARFARIN SODIUM 5 MG PO TABS: 5.0000 mg | ORAL_TABLET | Freq: Once | ORAL | Status: DC

## 2012-04-11 MED ORDER — LEVOFLOXACIN 750 MG PO TABS: 750.0000 mg | ORAL_TABLET | Freq: Every day | ORAL | Status: AC

## 2012-04-11 NOTE — Discharge Summary (Signed)
Physician Discharge Summary  Francisco Tanner RUE:[id number] DOB: [birth year] DOA: 04/09/2012  PCP: Francisco Hector, MD  Admit date: 04/09/2012 Discharge date: 04/11/2012  Recommendations for Outpatient Follow-up:  1. Follow with primary care physician for repeat chest x-ray in 4-6 weeks.   Discharge Diagnoses:  1. Community-acquired pneumonia, right upper lobe. Improved clinically. 2. COPD, stable. 3. Atrial flutter with long-term anticoagulation use. 4. Hypertension. 5. Chronic diastolic heart failure, compensated.   Discharge Condition: Stable and improved.  Diet recommendation:  Heart healthy.  Filed Weights   04/09/12 1413 04/10/12 0535  Weight: 101.606 kg (224 lb) 101.3 kg (223 lb 5.2 oz)    History of present illness:  This 66 year old man presented to the hospital with symptoms of right-sided chest pain, hemoptysis and dyspnea.. Please see initial history as outlined below: HPI: Francisco Tanner is a 66 y.o. male who presents with the above symptoms since approximately 10 AM this morning. He denies any fever. The hemoptysis did not involve any purulent sputum apparently. He has a history of COPD. He sees a pulmonologist, Dr. Sherene Tanner in [place]. There is no history of weight loss. He requires no oxygen at home for his COPD. He quit smoking over 12 years ago.  Hospital Course:  This man was admitted to the hospital and started on intravenous antibiotics. He was given supplemental oxygen as required but he quickly improved. He is no longer dyspneic at rest and his saturations are acceptable on room air. He is really not coughed up much sputum or blood anymore. He is very keen to go home. His chest x-ray was suggestive of a right upper lobe consolidation. There was concern of pulmonary embolism and therefore he underwent a CT angiogram of the chest. This showed/confirm the presence of right upper lobe pneumonia superimposed upon COPD. There is no central obstructing mass. He is now  afebrile, does not have increased work of breathing, does not have a resting tachycardia and is stable for discharge. His white blood cell count is normal.  Procedures:  None.  Consultations:  None.  Discharge Exam: Filed Vitals:   04/10/12 1944 04/10/12 2120 04/11/12 0448 04/11/12 0720  BP:  150/79 106/66   Pulse:  82 67   Temp:  98.2 F (36.8 C) 98.1 F (36.7 C)   TempSrc:  Oral Oral   Resp:  18 18   Height:      Weight:      SpO2: 97% 94% 94% 97%    General: He looks chronically sick but at the present time systemically well and nontoxic. Cardiovascular: Heart sounds are present and normal and appear to be in sinus rhythm today. There is no evidence of heart failure. Respiratory: Lung fields are absolutely clear today. There is no crackles, wheezes or bronchial breathing.  Discharge Instructions  Discharge Orders    Future Orders Please Complete By Expires   Diet - low sodium heart healthy      Increase activity slowly          Medication List     As of 04/11/2012  7:50 AM    TAKE these medications         AMBIEN 10 MG tablet   Generic drug: zolpidem   Take 10 mg by mouth at bedtime.      budesonide-formoterol 160-4.5 MCG/ACT inhaler   Commonly known as: SYMBICORT   Inhale 2 puffs into the lungs 2 (two) times daily.      FLOMAX 0.4 MG Caps   Generic drug: Tamsulosin HCl  Take 0.4 mg by mouth at bedtime.      HYDROcodone-acetaminophen 5-500 MG per tablet   Commonly known as: VICODIN   Take 1 tablet by mouth every 6 (six) hours as needed. For hip pain/pain      levofloxacin 750 MG tablet   Commonly known as: LEVAQUIN   Take 1 tablet (750 mg total) by mouth daily.      omeprazole 20 MG capsule   Commonly known as: PRILOSEC   Take 20 mg by mouth daily.      oxybutynin 5 MG tablet   Commonly known as: DITROPAN   Take 5 mg by mouth at bedtime.      potassium chloride 20 MEQ/15ML (10%) solution   Take 7.5 mLs (10 mEq total) by mouth daily.       potassium chloride 20 MEQ/15ML (10%) solution   Take 7.5 mEq by mouth daily.      sodium chloride 0.65 % nasal spray   Commonly known as: OCEAN   Place 1 spray into the nose as needed. For dryness      torsemide 20 MG tablet   Commonly known as: DEMADEX   Take 1 tablet (20 mg total) by mouth 2 (two) times daily.      warfarin 5 MG tablet   Commonly known as: COUMADIN   Take 2.5-5 mg by mouth daily. Take 1 tablet (5mg ) on Tuesday and Thursday. On Monday, Wednesday, Friday, Saturday, and Sunday, take 2.5 mg, one half tablet.          The results of significant diagnostics from this hospitalization (including imaging, microbiology, ancillary and laboratory) are listed below for reference.    Significant Diagnostic Studies: Ct Angio Chest Pe W/cm &/or Wo Cm  04/09/2012  *RADIOLOGY REPORT*  Clinical Data: Right-sided chest pain.  Hemoptysis.  History of pulmonary emboli.  CT ANGIOGRAPHY CHEST  Technique:  Multidetector CT imaging of the chest using the standard protocol during bolus administration of intravenous contrast. Multiplanar reconstructed images including MIPs were obtained and reviewed to evaluate the vascular anatomy.  Contrast: OMNIPAQUE IOHEXOL 350 MG/ML SOLN  Comparison: CTA chest 02/07/2011, 10/27/2009.  Findings: Contrast opacification of the pulmonary arteries is good. No filling defects within either main pulmonary artery or their branches in either lung to suggest pulmonary embolism.  Heart size normal with left ventricular hypertrophy.  Very small pericardial effusion in the superior recess.  Moderate to severe LAD and left circumflex coronary artery calcification.  Moderate to severe atherosclerosis involving the thoracic and upper abdominal aorta without aneurysm or dissection.  Proximal great vessels atherosclerotic though patent.  Airspace consolidation with air bronchograms in the right upper lobe.  Emphysematous changes in the upper lobes.  Minimal scarring in the  deep lower lobes and lingula.  Pulmonary parenchyma otherwise clear.  No pleural effusions.  Central airways patent with calcification of the tracheobronchial cartilages.  Scattered normal-sized mediastinal, hilar, and axillary lymph nodes; no significant lymphadenopathy.  Visualized upper abdomen unremarkable for the early arterial phase of enhancement.  Bone window images demonstrate thoracic spondylosis.  IMPRESSION:  1.  No evidence of pulmonary embolism. 2.  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 No central obstructing mass.   Original Report Authenticated By: Arnell Sieving, M.D.    Dg Chest Port 1 View  04/09/2012  *RADIOLOGY REPORT*  Clinical Data: Hemoptysis, shortness of breath.  On anticoagulation for previous pulmonary embolism.  PORTABLE CHEST - 1 VIEW  Comparison: Most recent chest radiograph 09/27/2011 and chest  CT 02/07/2011  Findings: New right upper lobe consolidation and volume loss noted with upper retraction of the minor fissure.  Patchy left lower lobe consolidation also noted.  Left costophrenic angle omitted the film view.  No pleural effusion.  Heart size is normal. Aorta is ectatic and unfolded.  No pneumothorax.  IMPRESSION: Right upper lobe consolidation and volume loss.  This could represent interval scarring from previous pulmonary embolism, however, central obstructing mass lesion or pneumonia could have a similar appearance.  Less prominent left lower lobe opacity also noted.  Depending on the clinical presentation, options include follow-up PA and lateral chest radiographs if symptoms suggest pneumonia or chest CT with IV contrast according to pulmonary embolism protocol for further evaluation.   Original Report Authenticated By: Harrel Lemon, M.D.     Microbiology: Recent Results (from the past 240 hour(s))  CULTURE, BLOOD (ROUTINE X 2)     Status: Normal (Preliminary result)   Collection Time   04/09/12  5:26 PM      Component Value Range  Status Comment   Specimen Description Blood LEFT ANTECUBITAL   Final    Special Requests BOTTLES DRAWN AEROBIC AND ANAEROBIC 6CC   Final    Culture NO GROWTH 2 DAYS   Final    Report Status PENDING   Incomplete   CULTURE, BLOOD (ROUTINE X 2)     Status: Normal (Preliminary result)   Collection Time   04/09/12  5:31 PM      Component Value Range Status Comment   Specimen Description Blood BLOOD RIGHT HAND   Final    Special Requests BOTTLES DRAWN AEROBIC AND ANAEROBIC 5CC   Final    Culture NO GROWTH 2 DAYS   Final    Report Status PENDING   Incomplete      Labs: Basic Metabolic Panel:  Lab 04/11/12 0981 04/10/12 0551 04/09/12 1432  NA 138 137 137  K 3.8 3.6 3.8  CL 103 98 98  CO2 27 28 26   GLUCOSE 93 115* 130*  BUN 11 12 16   CREATININE 0.72 0.72 0.70  CALCIUM 8.6 9.0 9.4  MG -- -- --  PHOS -- -- --   Liver Function Tests:  Lab 04/10/12 0551  AST 15  ALT 13  ALKPHOS 54  BILITOT 0.8  PROT 6.8  ALBUMIN 3.5     CBC:  Lab 04/10/12 0551 04/09/12 1432  WBC 9.6 10.3  NEUTROABS -- 9.1*  HGB 14.4 15.6  HCT 44.3 47.1  MCV 89.3 88.9  PLT 158 154   Cardiac Enzymes:  Lab 04/09/12 1432  CKTOTAL --  CKMB --  CKMBINDEX --  TROPONINI <0.30      Time coordinating discharge: *Less than 30 minutes  Signed:  Dalya Maselli C  [facility] 04/11/2012, 7:50 AM

## 2012-04-11 NOTE — Progress Notes (Signed)
04/11/12 0850 Patient discharged home, IV site d/c'd and within normal limits. Reviewed instructions with patient via teachbackl, given copy of instructions, med list, f/u information, prescription. Verbalized understanding, stated has appointment with primary physician already scheduled. C/o back pain, rated 8/10, refused pain medication this morning, stated would take medication once home. Stated "i've just got to get home to my bed so i can rest". Denied shortness of breath or other discomfort this morning. Pt left floor in stable condition via w/c accompanied by nurse tech. Riccardo Dubin

## 2012-04-14 LAB — CULTURE, BLOOD (ROUTINE X 2)
Culture: NO GROWTH
Culture: NO GROWTH

## 2012-06-10 NOTE — Progress Notes (Signed)
UR Chart Review Completed  

## 2012-08-31 ENCOUNTER — Encounter: Payer: Self-pay | Admitting: Internal Medicine

## 2012-08-31 ENCOUNTER — Ambulatory Visit (INDEPENDENT_AMBULATORY_CARE_PROVIDER_SITE_OTHER): Payer: Medicare Other | Admitting: Internal Medicine

## 2012-08-31 VITALS — BP 142/80 | HR 74 | Temp 98.1°F | Ht 67.5 in | Wt 225.0 lb

## 2012-08-31 DIAGNOSIS — J449 Chronic obstructive pulmonary disease, unspecified: Secondary | ICD-10-CM

## 2012-08-31 DIAGNOSIS — J4489 Other specified chronic obstructive pulmonary disease: Secondary | ICD-10-CM

## 2012-08-31 NOTE — Patient Instructions (Addendum)
Because you're not really by your lungs it's ok to wait until you are to take your symbicort   Please schedule a follow up visit in 6 months but call sooner if needed

## 2012-08-31 NOTE — Assessment & Plan Note (Signed)
-   PFTs 10/09/08 FEV1 1.39 ( 44%)ratio 40, DLCO 63%. 16% improvement after bronchodilator  - Overnight pulse ox 02/28/2009 5:17m spent with sat < 89 sleeping > declined further 02 July 05, 2009     Adequate control on present rx, reviewed ok to wait until symptoms arise before using symbicort    The proper method of use, as well as anticipated side effects, of a metered-dose inhaler are discussed and demonstrated to the patient. Improved effectiveness after extensive coaching during this visit to a level of approximately  90%     As I explained to this patient in detail:  although there may be significant copd present, it does not appear to be limiting activity tolerance any more than a set of worn tires limits someone from driving a car  around a parking lot.  A new set of Michelins might look good but would have no perceived impact on the performance of the car and would not be worth the cost.  That is to say:   this pt is so sedentary I don't recommend aggressive pulmonary rx at this point unless limiting symptoms arise or acute exacerbations become as issue, neither of which are the case now.  I asked the patient to contact this office at any time in the future should either of these problems arise otherwise f/u can be every 6 months

## 2012-08-31 NOTE — Progress Notes (Signed)
Subjective:     Patient ID: Francisco Tanner, male   DOB: [birth year]    MRN: [id number]  Brief patient profile:  12  yowm quit smoking 2004 with GOLD III COPD by PFT's 2010  And PE with infartions 01/2011   HPI Admit [facility] 1/21 -29/2010 with 1  year orthopnea/pnd then gradual anasarca and caf > coumadin/ cardioversion 08/23/08 > marked improvement, discharged on 02 but not using.   September 08, 2008 post hosp fu no problem with doe walked at [facility] 2/11 did ok except legs weak. no cough. rec use spiriva as maint plus as needed albuterol, feels the albuterol really makes a difference.   October 09, 2008 returns for PFTs as requested stating he still feels the need for using albuterol at least two or 3 times a week. However, he is more limited by orthopedic constraints at this point than dyspnea.  rec: stop spiriva - think of it like high octane fuel - it will give you better perfomormance if used regularly each am but you may not need it.  Try Symbicort 160 2 puffs first thing in am and 2 puffs again in pm about 12 hours later and work on perfecting technique  Please schedule a follow-up appointment in 6 weeks, sooner if needed  use 02 at bedtime automatically as needed during the day   November 20, 2008 better, just using symbicort in am and no spiriva at all, no other rx. no change in rx.   Mar 12, 2009 [facility] admit for [facility]   April 23, 2009 post op f/u f/u doing outpt rehab  rec  Work on inhaler technique: relax and blow all the way out then take a nice smooth deep breath back in, triggering the inhaler at same time you start breathing in  wear 02 automatically at bedtime for now at 2 lpm    August 20, 2009 does fine flat on flat surface trouble with inclines, no cough, no cp. Only uses symbicort 160 2 puffs each am no prns or pm use of symbicort. rec no change rx   Admit [facility]  DATE OF ADMISSION: 02/07/2011  DATE OF DISCHARGE: 02/13/2011   DISCHARGE DIAGNOSES:  1. Recurrent atrial  flutter status TEE guided cardioversion.  2. Pulmonary embolism/pulmonary infarct and shortness of breath.  3. Chronic obstructive pulmonary disease.  4. 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  5. Pulmonary hypertension.  6. Benign prostatic hypertrophy and bladder spasm.  7. Chronic back pain.  8. Diastolic and systolic congestive heart failure, chronic, stable  and currently compensated.  9. History of left hip arthroplasty.  10.Insomnia.  11.A spiculated area on the patient's right lung seen on CT that will  require further radiologic studies for follow-up over the next 30  to 60 days.  03/11/2011 ov/Sunshyne Horvath cc doe back to baseline, not limiting him from desired activities uses [facility] parking very little walking more than that, only using symbicort in am,  rec Ok to use symbicort 2 puffs every 12 hours as need when your breathing is limiting you from desired activities    07/01/2011 f/u ov/Remmi Armenteros [place] office cc  Indolent onset sob x months but only when bending over, activity limited by back and knee pain, not sob.  No cough, assoc with progressive wt gain he attributes to immobility.  rec Please schedule a follow up visit in 6 months but call sooner if needed if breathing worsens Weight control is simply a matter of calorie balance  03/02/2012 f/u ov/Aleiah Mohammed still on symbicort 160 2bid but only uses as needed.  No unusual cough, purulent sputum or sinus/hb symptoms on present rx. Limited by back, not breathing - no need for saba.  rec F/u CT chest   08/31/2012 f/u ov/Tran Arzuaga cc breathing no better but mostly limited by back and hip  No obvious daytime variabilty or assoc chronic cough or cp or chest tightness, subjective wheeze overt sinus or hb symptoms. No unusual exp hx   Sleeping ok without nocturnal  or early am exacerbation  of respiratory  c/o's or need for noct saba. Also denies any obvious fluctuation of symptoms with weather or environmental changes or other  aggravating or alleviating factors except as outlined above   ROS  The following are not active complaints unless bolded sore throat, dysphagia, dental problems, itching, sneezing,  nasal congestion or excess/ purulent secretions, ear ache,   fever, chills, sweats, unintended wt loss, pleuritic or exertional cp, hemoptysis,  orthopnea pnd or leg swelling, presyncope, palpitations, heartburn, abdominal pain, anorexia, nausea, vomiting, diarrhea  or change in bowel or urinary habits, change in stools or urine, dysuria,hematuria,  rash, arthralgias, visual complaints, headache, numbness weakness or ataxia or problems with walking or coordination,  change in mood/affect or memory.        Past Medical History:  ATRIAL FLUTTER (ICD-427.32)  -Echo 09/17/2008 nl ef, mild lae, rv mod dilated, pasystolic 46  -Noct desat 02/28/2009, refused 02 @[facility]   COPD..............................................................................................Marland KitchenWert  - PFTs 10/09/08 FEV1 1.39 ( 44%)ratio 40, DLCO 63%. 16% improvement after bronchodilator  - Overnight pulse ox 02/28/2009 5:40m spent with sat < 89 sleeping > declined further 02 July 05, 2009  - HFA 75% April 23, 2009  SPN right mid zone  - first detected 08/16/08  - See CT 08/16/08 [facility] > see repeat CT 02/07/11  Weight Gain  - Target wt = 196 for BMI < 30        Objective:   Physical Exam Very slow ambulation with cane not limited by sob  wt 214 September 08, 2008 > 228 February 16, 2009 >  > 232 August 20, 2009 > 214 03/11/2011 > 208 04/28/2011 > 222 07/01/2011 > 03/02/2012 221 > 08/31/2012  225  HEENT mild turbinate edema. Oropharynx no thrush or excess pnd or cobblestoning. No JVD or cervical adenopathy. Mild accessory muscle hypertrophy. Trachea midline, nl thryroid. Chest was hyperinflated by percussion with diminished breath sounds and marked increased exp time without wheeze. Hoover sign positive onset of inspiration. Regular rate and rhythm without murmur  gallop or rub or increase P2. No edema. Decrease s1s2 Abd: no hsm, nl excursion. Ext warm without Cynosis or clubbing    CXR  07/01/2011 :  Minimal band like atx RLL / RML, no mass or effusion   Assessment:         Plan:

## 2012-09-01 ENCOUNTER — Ambulatory Visit (INDEPENDENT_AMBULATORY_CARE_PROVIDER_SITE_OTHER): Payer: Medicare Other | Admitting: Cardiology

## 2012-09-01 ENCOUNTER — Encounter: Payer: Self-pay | Admitting: Cardiology

## 2012-09-01 VITALS — BP 190/100 | HR 69 | Ht 67.0 in | Wt 224.0 lb

## 2012-09-01 DIAGNOSIS — I5032 Chronic diastolic (congestive) heart failure: Secondary | ICD-10-CM

## 2012-09-01 DIAGNOSIS — I739 Peripheral vascular disease, unspecified: Secondary | ICD-10-CM

## 2012-09-01 DIAGNOSIS — I4892 Unspecified atrial flutter: Secondary | ICD-10-CM

## 2012-09-01 DIAGNOSIS — I10 Essential (primary) hypertension: Secondary | ICD-10-CM

## 2012-09-01 MED ORDER — RIVAROXABAN 20 MG PO TABS: 20.0000 mg | ORAL_TABLET | Freq: Every day | ORAL | Status: DC

## 2012-09-01 NOTE — Progress Notes (Signed)
HPI The patient presents for followup of atrial flutter and diastolic heart failure and atrial flutter.  He was hospitalized in Sept with pneumonia.  He's very limited by his back and joint pains.  He has not felt any symptoms consistent with his previous flutter. He's not describing any chest pressure, neck or arm discomfort.  He does still have his chronic dyspnea with any exertion. He mentions that his warfarin levels have been "bouncing around."  No Known Allergies  Current Outpatient Prescriptions  Medication Sig Dispense Refill  . budesonide-formoterol (SYMBICORT) 160-4.5 MCG/ACT inhaler Inhale 2 puffs into the lungs 2 (two) times daily.       [name] HYDROcodone-acetaminophen (VICODIN) 5-500 MG per tablet Take 1 tablet by mouth every 6 (six) hours as needed. For hip pain/pain       . omeprazole (PRILOSEC) 20 MG capsule Take 20 mg by mouth daily.      [name] oxybutynin (DITROPAN) 5 MG tablet Take 5 mg by mouth at bedtime.      . potassium chloride 20 MEQ/15ML (10%) solution Take 7.5 mEq by mouth daily.      . sodium chloride (OCEAN) 0.65 % nasal spray Place 1 spray into the nose as needed. For dryness      . Tamsulosin HCl (FLOMAX) 0.4 MG CAPS Take 0.4 mg by mouth at bedtime.        [name] warfarin (COUMADIN) 5 MG tablet Take 2.5-5 mg by mouth daily. Take 1 tablet (5mg ) on Tuesday and Thursday. On Monday, Wednesday, Friday, Saturday, and Sunday, take 2.5 mg, one half tablet.       [name] zolpidem (AMBIEN) 10 MG tablet Take 10 mg by mouth at bedtime.         Past Medical History  Diagnosis Date  . COPD (chronic obstructive pulmonary disease)     Wert. PFTs 10/09/08 FEV1 1.39 (44%) ratio 40, DLCO 63%. 16% improvement after bronchodilator. overnight pulse ox 02/28/09 5:90m pent with sat< 89 sleeping > declined further o2 07/05/09. HFA 75% 04/23/09.   [name] Atrial flutter     a. echo 09/28/11 EF 50-55%, mild RAE, RV mildly dilated, systolic fxn mild-mod reduced;  b. 01/2012 s/p EPS & RFCA.  [name] Abnormal CT scan    SPN right mid zone. first detected 08/16/08; see CT [facility]   . Weight gain     target weight 196; BMI <30  . Lung nodule   . Hypertension   . Chronic diastolic CHF (congestive heart failure)   . Prostate enlargement     Past Surgical History  Procedure Date  . Hip replacement surgery     L   . Kyphosis surgery   . Back surgery     x4  . Knee surgery   . Cardioversion     in past  . Incision and drainage hip 09/24/2011    Procedure: IRRIGATION AND DEBRIDEMENT HIP;  Surgeon: Loanne Drilling, MD;  Location: [facility] ORS;  Service: Orthopedics;  Laterality: Left;  . Joint replacement     ROS:  As stated in the HPI and negative for all other systems.  PHYSICAL EXAM BP 190/100  Pulse 69  Ht 5\' 7"  (1.702 m)  Wt 224 lb (101.606 kg)  BMI 35.08 kg/m2 GEN: No distress  NECK: No jugular venous distention at 90 degrees, waveform within normal limits, carotid upstroke brisk and symmetric, no bruits, no thyromegaly  LUNGS: Clear to auscultation bilaterally  BACK: No CVA tenderness  CHEST: Unremarkable  HEART: S1 and S2  within normal limits, no S3, no S4, no clicks, no rubs, no murmurs  ABD: Positive bowel sounds normal in frequency in pitch, no bruits, no rebound, no guarding, unable to assess midline mass or bruit with the patient seated.  EXT: 2 plus pulses upper and diminished bilateral lower, trace edema, no cyanosis no clubbing   EKG:  Sinus, rate 70, right bundle branch block, possible right ventricular hypertrophy, no acute ST-T wave changes  09/01/2012  ASSESSMENT AND PLAN  1. Chronic Diastolic CHF  He seems to be euvolemic.  At this point, no change in therapy is indicated.   No further cardiovascular testing is indicated.  We reviewed when necessary dosing of diuretic which she's currently not needing.  2. Atrial Flutter  He is status post ablation.  I'm going to give him a prescription for Xarelto. I'd like to try to switch to one of the novel agents and although this will depend on  his insurance coverage.  3. Hypertension  The blood pressure is elevated today his weight uncomfortable with back pain. It did come down during the course of his visit. He can keep an eye on this at home.

## 2012-09-01 NOTE — Patient Instructions (Addendum)
Please stop Coumadin and start Xarelto 20 mg a day. Continue all other medications as listed.  Follow up in 6 month with Dr Antoine Poche.  You will receive a letter in the mail 2 months before you are due.  Please call us when you receive this letter to schedule your follow up appointment.

## 2013-11-10 ENCOUNTER — Other Ambulatory Visit: Payer: Self-pay | Admitting: Cardiology

## 2014-02-22 ENCOUNTER — Ambulatory Visit (INDEPENDENT_AMBULATORY_CARE_PROVIDER_SITE_OTHER): Payer: Medicare Other | Admitting: Cardiology

## 2014-02-22 ENCOUNTER — Encounter: Payer: Self-pay | Admitting: Cardiology

## 2014-02-22 VITALS — BP 175/94 | HR 86 | Ht 67.5 in | Wt 191.0 lb

## 2014-02-22 DIAGNOSIS — I483 Typical atrial flutter: Secondary | ICD-10-CM

## 2014-02-22 DIAGNOSIS — I10 Essential (primary) hypertension: Secondary | ICD-10-CM

## 2014-02-22 DIAGNOSIS — I4892 Unspecified atrial flutter: Secondary | ICD-10-CM

## 2014-02-22 NOTE — Progress Notes (Signed)
HPI The patient presents for followup of atrial flutter and diastolic heart failure and atrial flutter.   Since last saw him he said no further cardiovascular complaints. His blood pressures been elevated and he was started on Norvasc. This was recently adjusted.  He is severely limited by back pain and is going to see a pain specialist.  He  Denies any paroxysms of flutter. He says his breathing is better he's not had any new swelling.  No Known Allergies  Current Outpatient Prescriptions  Medication Sig Dispense Refill  . amLODipine (NORVASC) 10 MG tablet Take 10 mg by mouth daily.      . budesonide-formoterol (SYMBICORT) 160-4.5 MCG/ACT inhaler Inhale 2 puffs into the lungs 2 (two) times daily.       [name] HYDROcodone-acetaminophen (VICODIN) 5-500 MG per tablet Take 1 tablet by mouth every 6 (six) hours as needed. For hip pain/pain       . losartan (COZAAR) 50 MG tablet Take 50 mg by mouth daily.      [name] omeprazole (PRILOSEC) 20 MG capsule Take 20 mg by mouth daily.      [name] oxybutynin (DITROPAN) 5 MG tablet Take 5 mg by mouth at bedtime.      . Rivaroxaban (XARELTO) 20 MG TABS Take 1 tablet (20 mg total) by mouth daily.  30 tablet  11  . sodium chloride (OCEAN) 0.65 % nasal spray Place 1 spray into the nose as needed. For dryness      . Tamsulosin HCl (FLOMAX) 0.4 MG CAPS Take 0.4 mg by mouth at bedtime.        [name] zolpidem (AMBIEN) 10 MG tablet Take 10 mg by mouth at bedtime.        No current facility-administered medications for this visit.    Past Medical History  Diagnosis Date  . COPD (chronic obstructive pulmonary disease)     Wert. PFTs 10/09/08 FEV1 1.39 (44%) ratio 40, DLCO 63%. 16% improvement after bronchodilator. overnight pulse ox 02/28/09 5:38m pent with sat< 89 sleeping > declined further o2 07/05/09. HFA 75% 04/23/09.   [name] Atrial flutter     a. echo 09/28/11 EF 50-55%, mild RAE, RV mildly dilated, systolic fxn mild-mod reduced;  b. 01/2012 s/p EPS & RFCA.  [name] Abnormal CT scan    SPN right mid zone. first detected 08/16/08; see [facility]   . Weight gain     target weight 196; BMI <30  . Lung nodule   . Hypertension   . Chronic diastolic CHF (congestive heart failure)   . Prostate enlargement     Past Surgical History  Procedure Laterality Date  . Hip replacement surgery      L   . Kyphosis surgery    . Back surgery      x4  . Knee surgery    . Cardioversion      in past  . Incision and drainage hip  09/24/2011    Procedure: IRRIGATION AND DEBRIDEMENT HIP;  Surgeon: Gearlean Alf, MD;  Location: [facility]S;  Service: Orthopedics;  Laterality: Left;  . Joint replacement      ROS:  As stated in the HPI and negative for all other systems.  PHYSICAL EXAM BP 175/94  Pulse 86  Ht 5' 7.5" (1.715 m)  Wt 191 lb (86.637 kg)  BMI 29.46 kg/m2 GEN: No distress  NECK: No jugular venous distention at 90 degrees, waveform within normal limits, carotid upstroke brisk and symmetric, no bruits, no thyromegaly  LUNGS:  Clear to auscultation bilaterally  BACK: No CVA tenderness  CHEST: Unremarkable  HEART: S1 and S2 within normal limits, no S3, no S4, no clicks, no rubs, no murmurs  ABD: Positive bowel sounds normal in frequency in pitch, no bruits, no rebound, no guarding, unable to assess midline mass or bruit with the patient seated.  EXT: 2 plus pulses upper and diminished bilateral lower, trace edema, no cyanosis no clubbing   EKG:  Sinus, rate 74, right bundle branch block, possible right ventricular hypertrophy, no acute ST-T wave changes  02/22/2014  ASSESSMENT AND PLAN  Chronic Diastolic CHF  He seems to be euvolemic.  At this point, no change in therapy is indicated.   No further cardiovascular testing is indicated.    Atrial Flutter  He is status post ablation.  He has had no  Recurrent paroxysms of this. He tolerates blood thinner. No change in therapy is indicated.  Hypertension  The blood pressure is elevated today  However, he recently had Norvasc started  and this is being followed closely. In therapy is indicated.

## 2014-02-22 NOTE — Patient Instructions (Signed)
The current medical regimen is effective;  continue present plan and medications.  Follow up in 1 year with Dr Hochrein.  You will receive a letter in the mail 2 months before you are due.  Please call us when you receive this letter to schedule your follow up appointment.  

## 2014-03-15 ENCOUNTER — Ambulatory Visit: Payer: Medicare Other | Admitting: Cardiology

## 2014-06-12 DIAGNOSIS — G8929 Other chronic pain: Secondary | ICD-10-CM | POA: Insufficient documentation

## 2014-07-06 ENCOUNTER — Encounter (HOSPITAL_COMMUNITY): Payer: Self-pay | Admitting: Internal Medicine

## 2014-08-30 NOTE — Patient Instructions (Addendum)
Your procedure is scheduled on: 09/04/2014  Report to [facility] at 1000 AM.  Call this number if you have problems Francisco morning of surgery: [phone]   Do not eat food or drink liquids :After Midnight.      Take these medicines Francisco morning of surgery with A SIP OF WATER: amlodipine(norvasc), vicodin, losartan( cozaar), prilosec, ditropan, flomax. Take your symbicort before you come. Use your Albuterol nebulizer if needed.   Do not wear jewelry, make-up or nail polish.  Do not wear lotions, powders, or perfumes.   Do not shave 48 hours prior to surgery.  Do not bring valuables to Francisco hospital.  Contacts, dentures or bridgework may not be worn into surgery.  Leave suitcase in Francisco car. After surgery it may be brought to your room.  For patients admitted to Francisco hospital, checkout time is 11:00 AM Francisco day of discharge.   Patients discharged Francisco day of surgery will not be allowed to drive home.  :     Please read over Francisco following fact sheets that you were given: Coughing and Deep Breathing, Surgical Site Infection Prevention, Anesthesia Post-op Instructions and Care and Recovery After Surgery    Cataract A cataract is a clouding of Francisco lens of Francisco eye. When a lens becomes cloudy, vision is reduced based on Francisco degree and nature of Francisco clouding. Many cataracts reduce vision to some degree. Some cataracts make people more near-sighted as they develop. Other cataracts increase glare. Cataracts that are ignored and become worse can sometimes look Francisco Tanner. Francisco Tanner color can be seen through Francisco pupil. CAUSES   Aging. However, cataracts may occur at any age, even in newborns.   Certain drugs.   Trauma to Francisco eye.   Certain diseases such as diabetes.   Specific eye diseases such as chronic inflammation inside Francisco eye or a sudden attack of a rare form of glaucoma.   Inherited or acquired medical problems.  SYMPTOMS   Gradual, progressive drop in vision in Francisco affected eye.   Severe, rapid  visual loss. This most often happens when trauma is Francisco cause.  DIAGNOSIS  To detect a cataract, an eye doctor examines Francisco lens. Cataracts are best diagnosed with an exam of Francisco eyes with Francisco pupils enlarged (dilated) by drops.  TREATMENT  For an early cataract, vision may improve by using different eyeglasses or stronger lighting. If that does not help your vision, surgery is Francisco only effective treatment. A cataract needs to be surgically removed when vision loss interferes with your everyday activities, such as driving, reading, or watching TV. A cataract may also have to be removed if it prevents examination or treatment of another eye problem. Surgery removes Francisco cloudy lens and usually replaces it with a substitute lens (intraocular lens, IOL).  At a time when both you and your doctor agree, Francisco cataract will be surgically removed. If you have cataracts in both eyes, only one is usually removed at a time. This allows Francisco operated eye to heal and be out of danger from any possible problems after surgery (such as infection or poor wound healing). In rare cases, a cataract may be doing damage to your eye. In these cases, your caregiver may advise surgical removal right away. Francisco vast majority of people who have cataract surgery have better vision afterward. HOME CARE INSTRUCTIONS  If you are not planning surgery, you may be asked to do Francisco following:  Use different eyeglasses.   Use stronger or brighter  lighting.   Ask your eye doctor about reducing your medicine dose or changing medicines if it is thought that a medicine caused your cataract. Changing medicines does not make Francisco cataract go away on its own.   Become familiar with your surroundings. Poor vision can lead to injury. Avoid bumping into things on Francisco affected side. You are at a higher risk for tripping or falling.   Exercise extreme care when driving or operating machinery.   Wear sunglasses if you are sensitive to bright light or  experiencing problems with glare.  SEEK IMMEDIATE MEDICAL CARE IF:   You have a worsening or sudden vision loss.   You notice redness, swelling, or increasing pain in Francisco eye.   You have a fever.  Document Released: 07/14/2005 Document Revised: 07/03/2011 Document Reviewed: 03/07/2011 [facility] Patient Information 2012 [place].PATIENT INSTRUCTIONS POST-ANESTHESIA  IMMEDIATELY FOLLOWING SURGERY:  Do not drive or operate machinery for Francisco first twenty four hours after surgery.  Do not make any important decisions for twenty four hours after surgery or while taking narcotic pain medications or sedatives.  If you develop intractable nausea and vomiting or a severe headache please notify your doctor immediately.  FOLLOW-UP:  Please make an appointment with your surgeon as instructed. You do not need to follow up with anesthesia unless specifically instructed to do so.  WOUND CARE INSTRUCTIONS (if applicable):  Keep a dry clean dressing on Francisco anesthesia/puncture wound site if there is drainage.  Once Francisco wound has quit draining you may leave it open to air.  Generally you should leave Francisco bandage intact for twenty four hours unless there is drainage.  If Francisco epidural site drains for more than 36-48 hours please call Francisco anesthesia department.  QUESTIONS?:  Please feel free to call your physician or Francisco hospital operator if you have any questions, and they will be happy to assist you.

## 2014-08-31 ENCOUNTER — Encounter (HOSPITAL_COMMUNITY): Payer: Self-pay

## 2014-08-31 ENCOUNTER — Encounter (HOSPITAL_COMMUNITY)
Admission: RE | Admit: 2014-08-31 | Discharge: 2014-08-31 | Disposition: A | Discharge status: Home / Self Care | Payer: Medicare Other | Source: Ambulatory Visit | Attending: Ophthalmology | Admitting: Ophthalmology

## 2014-08-31 DIAGNOSIS — Z01818 Encounter for other preprocedural examination: Secondary | ICD-10-CM | POA: Diagnosis present

## 2014-08-31 DIAGNOSIS — H2511 Age-related nuclear cataract, right eye: Secondary | ICD-10-CM | POA: Insufficient documentation

## 2014-08-31 HISTORY — DX: Sleep apnea, unspecified: G47.30

## 2014-08-31 HISTORY — DX: Gastro-esophageal reflux disease without esophagitis: K21.9

## 2014-08-31 HISTORY — DX: Claustrophobia: F40.240

## 2014-08-31 LAB — CBC
HCT: 38.8 % — ABNORMAL LOW (ref 39.0–52.0)
HEMOGLOBIN: 11.6 g/dL — AB (ref 13.0–17.0)
MCH: 24.2 pg — ABNORMAL LOW (ref 26.0–34.0)
MCHC: 29.9 g/dL — ABNORMAL LOW (ref 30.0–36.0)
MCV: 80.8 fL (ref 78.0–100.0)
Platelets: 152 10*3/uL (ref 150–400)
RBC: 4.8 MIL/uL (ref 4.22–5.81)
RDW: 19.1 % — AB (ref 11.5–15.5)
WBC: 3.7 10*3/uL — ABNORMAL LOW (ref 4.0–10.5)

## 2014-08-31 LAB — BASIC METABOLIC PANEL
Anion gap: 5 (ref 5–15)
BUN: 18 mg/dL (ref 6–23)
CHLORIDE: 107 mmol/L (ref 96–112)
CO2: 29 mmol/L (ref 19–32)
Calcium: 8.5 mg/dL (ref 8.4–10.5)
Creatinine, Ser: 0.64 mg/dL (ref 0.50–1.35)
GFR calc Af Amer: >90 mL/min (ref >90)
GFR calc non Af Amer: >90 mL/min (ref >90)
Glucose, Bld: 98 mg/dL (ref 70–99)
POTASSIUM: 3.8 mmol/L (ref 3.5–5.1)
Sodium: 141 mmol/L (ref 135–145)

## 2014-08-31 NOTE — Progress Notes (Signed)
   08/31/14 0737  OBSTRUCTIVE SLEEP APNEA  Have you ever been diagnosed with sleep apnea through a sleep study? No  Do you snore loudly (loud enough to be heard through closed doors)?  0  Do you often feel tired, fatigued, or sleepy during the daytime? 1  Has anyone observed you stop breathing during your sleep? 0  Do you have, or are you being treated for high blood pressure? 1  BMI more than 35 kg/m2? 0  Age over 69 years old? 1  Neck circumference greater than 40 cm/16 inches? 1  Gender: 1  Obstructive Sleep Apnea Score 5

## 2014-09-01 MED ORDER — LIDOCAINE HCL 3.5 % OP GEL
OPHTHALMIC | Status: AC
  Filled 2014-09-01: qty 1

## 2014-09-01 MED ORDER — NEOMYCIN-POLYMYXIN-DEXAMETH 3.5-10000-0.1 OP SUSP
OPHTHALMIC | Status: AC
  Filled 2014-09-01: qty 5

## 2014-09-01 MED ORDER — PHENYLEPHRINE HCL 2.5 % OP SOLN
OPHTHALMIC | Status: AC
  Filled 2014-09-01: qty 15

## 2014-09-01 MED ORDER — TETRACAINE HCL 0.5 % OP SOLN
OPHTHALMIC | Status: AC
  Filled 2014-09-01: qty 2

## 2014-09-01 MED ORDER — CYCLOPENTOLATE-PHENYLEPHRINE OP SOLN OPTIME - NO CHARGE
OPHTHALMIC | Status: AC
  Filled 2014-09-01: qty 2

## 2014-09-01 MED ORDER — LIDOCAINE HCL (PF) 1 % IJ SOLN
INTRAMUSCULAR | Status: AC
  Filled 2014-09-01: qty 2

## 2014-09-04 ENCOUNTER — Ambulatory Visit (HOSPITAL_COMMUNITY)
Admission: RE | Admit: 2014-09-04 | Discharge: 2014-09-04 | Disposition: A | Discharge status: Home / Self Care | Payer: Medicare Other | Source: Ambulatory Visit | Attending: Ophthalmology | Admitting: Ophthalmology

## 2014-09-04 ENCOUNTER — Ambulatory Visit (HOSPITAL_COMMUNITY): Payer: Medicare Other | Admitting: Anesthesiology

## 2014-09-04 ENCOUNTER — Encounter (HOSPITAL_COMMUNITY): Payer: Self-pay

## 2014-09-04 ENCOUNTER — Encounter (HOSPITAL_COMMUNITY)
Admission: RE | Disposition: A | Discharge status: Home / Self Care | Payer: Self-pay | Source: Ambulatory Visit | Attending: Ophthalmology

## 2014-09-04 DIAGNOSIS — H2511 Age-related nuclear cataract, right eye: Secondary | ICD-10-CM | POA: Diagnosis not present

## 2014-09-04 DIAGNOSIS — K219 Gastro-esophageal reflux disease without esophagitis: Secondary | ICD-10-CM | POA: Insufficient documentation

## 2014-09-04 DIAGNOSIS — I739 Peripheral vascular disease, unspecified: Secondary | ICD-10-CM | POA: Insufficient documentation

## 2014-09-04 DIAGNOSIS — Z87891 Personal history of nicotine dependence: Secondary | ICD-10-CM | POA: Diagnosis not present

## 2014-09-04 DIAGNOSIS — J45909 Unspecified asthma, uncomplicated: Secondary | ICD-10-CM | POA: Diagnosis not present

## 2014-09-04 DIAGNOSIS — G4730 Sleep apnea, unspecified: Secondary | ICD-10-CM | POA: Insufficient documentation

## 2014-09-04 DIAGNOSIS — I10 Essential (primary) hypertension: Secondary | ICD-10-CM | POA: Diagnosis not present

## 2014-09-04 DIAGNOSIS — F419 Anxiety disorder, unspecified: Secondary | ICD-10-CM | POA: Insufficient documentation

## 2014-09-04 DIAGNOSIS — I2699 Other pulmonary embolism without acute cor pulmonale: Secondary | ICD-10-CM | POA: Insufficient documentation

## 2014-09-04 DIAGNOSIS — I509 Heart failure, unspecified: Secondary | ICD-10-CM | POA: Diagnosis not present

## 2014-09-04 HISTORY — PX: CATARACT EXTRACTION W/PHACO: SHX586

## 2014-09-04 SURGERY — PHACOEMULSIFICATION, CATARACT, WITH IOL INSERTION
Anesthesia: Monitor Anesthesia Care | Site: Eye | Laterality: Right

## 2014-09-04 MED ORDER — EPINEPHRINE HCL 1 MG/ML IJ SOLN
INTRAOCULAR | Status: DC | PRN
  Administered 2014-09-04: 500 mL

## 2014-09-04 MED ORDER — POVIDONE-IODINE 5 % OP SOLN
OPHTHALMIC | Status: DC | PRN
  Administered 2014-09-04: 1 via OPHTHALMIC

## 2014-09-04 MED ORDER — EPINEPHRINE HCL 1 MG/ML IJ SOLN
INTRAMUSCULAR | Status: AC
  Filled 2014-09-04: qty 1

## 2014-09-04 MED ORDER — EPINEPHRINE HCL 1 MG/ML IJ SOLN
INTRAMUSCULAR | Status: DC | PRN
  Administered 2014-09-04: .6 mL via OPHTHALMIC

## 2014-09-04 MED ORDER — TETRACAINE HCL 0.5 % OP SOLN
1.0000 [drp] | OPHTHALMIC | Status: AC
  Administered 2014-09-04 (×3): 1 [drp] via OPHTHALMIC

## 2014-09-04 MED ORDER — LIDOCAINE 3.5 % OP GEL OPTIME - NO CHARGE
OPHTHALMIC | Status: DC | PRN
  Administered 2014-09-04: 2 [drp] via OPHTHALMIC

## 2014-09-04 MED ORDER — FENTANYL CITRATE 0.05 MG/ML IJ SOLN
25.0000 ug | INTRAMUSCULAR | Status: AC
  Administered 2014-09-04 (×2): 25 ug via INTRAVENOUS

## 2014-09-04 MED ORDER — LACTATED RINGERS IV SOLN
INTRAVENOUS | Status: DC
  Administered 2014-09-04: 11:00:00 via INTRAVENOUS

## 2014-09-04 MED ORDER — FENTANYL CITRATE 0.05 MG/ML IJ SOLN
INTRAMUSCULAR | Status: DC | PRN
  Administered 2014-09-04 (×2): 25 ug via INTRAVENOUS

## 2014-09-04 MED ORDER — MIDAZOLAM HCL 2 MG/2ML IJ SOLN
1.0000 mg | INTRAMUSCULAR | Status: DC | PRN
Start: 2014-09-04 — End: 2014-09-04
  Administered 2014-09-04: 2 mg via INTRAVENOUS

## 2014-09-04 MED ORDER — NEOMYCIN-POLYMYXIN-DEXAMETH 3.5-10000-0.1 OP SUSP
OPHTHALMIC | Status: DC | PRN
  Administered 2014-09-04: 2 [drp] via OPHTHALMIC

## 2014-09-04 MED ORDER — LIDOCAINE HCL 3.5 % OP GEL: 1.0000 "application " | Freq: Once | OPHTHALMIC | Status: DC

## 2014-09-04 MED ORDER — FENTANYL CITRATE 0.05 MG/ML IJ SOLN
INTRAMUSCULAR | Status: AC
  Filled 2014-09-04: qty 2

## 2014-09-04 MED ORDER — CYCLOPENTOLATE-PHENYLEPHRINE 0.2-1 % OP SOLN
1.0000 [drp] | OPHTHALMIC | Status: AC
  Administered 2014-09-04 (×3): 1 [drp] via OPHTHALMIC

## 2014-09-04 MED ORDER — MIDAZOLAM HCL 2 MG/2ML IJ SOLN
INTRAMUSCULAR | Status: AC
  Filled 2014-09-04: qty 2

## 2014-09-04 MED ORDER — PROVISC 10 MG/ML IO SOLN
INTRAOCULAR | Status: DC | PRN
  Administered 2014-09-04: 0.85 mL via INTRAOCULAR

## 2014-09-04 MED ORDER — BSS IO SOLN
INTRAOCULAR | Status: DC | PRN
  Administered 2014-09-04: 15 mL

## 2014-09-04 MED ORDER — PHENYLEPHRINE HCL 2.5 % OP SOLN
1.0000 [drp] | OPHTHALMIC | Status: AC
  Administered 2014-09-04 (×3): 1 [drp] via OPHTHALMIC

## 2014-09-04 SURGICAL SUPPLY — 11 items
CLOTH BEACON ORANGE TIMEOUT ST (SAFETY) ×1 IMPLANT
EYE SHIELD UNIVERSAL CLEAR (GAUZE/BANDAGES/DRESSINGS) ×1 IMPLANT
GLOVE BIOGEL PI IND STRL 7.0 (GLOVE) IMPLANT
GLOVE BIOGEL PI INDICATOR 7.0 (GLOVE) ×1
GLOVE EXAM NITRILE MD LF STRL (GLOVE) ×1 IMPLANT
PAD ARMBOARD 7.5X6 YLW CONV (MISCELLANEOUS) ×1 IMPLANT
SIGHTPATH CAT PROC W REG LENS (Ophthalmic Related) ×2 IMPLANT
SYRINGE LUER LOK 1CC (MISCELLANEOUS) ×1 IMPLANT
TAPE SURG TRANSPORE 1 IN (GAUZE/BANDAGES/DRESSINGS) IMPLANT
TAPE SURGICAL TRANSPORE 1 IN (GAUZE/BANDAGES/DRESSINGS) ×1
WATER STERILE IRR 250ML POUR (IV SOLUTION) ×1 IMPLANT

## 2014-09-04 NOTE — OR Nursing (Signed)
Patient in alot of pain pain score 7 of 10, fentanyl 60mcg sent with CRNA for additional medication if needed

## 2014-09-04 NOTE — Transfer of Care (Signed)
Immediate Anesthesia Transfer of Care Note  Patient: Francisco Tanner  Procedure(s) Performed: Procedure(s): CATARACT EXTRACTION PHACO AND INTRAOCULAR LENS PLACEMENT RIGHT EYE CDE=12.15 (Right)  Patient Location: Short Stay  Anesthesia Type:MAC  Level of Consciousness: awake, alert , oriented and patient cooperative  Airway & Oxygen Therapy: Patient Spontanous Breathing  Post-op Assessment: Report given to RN, Post -op Vital signs reviewed and stable and Patient moving all extremities  Post vital signs: Reviewed and stable  Last Vitals:  Filed Vitals:   09/04/14 1115  BP: 142/76  Temp:   Resp: 18    Complications: No apparent anesthesia complications

## 2014-09-04 NOTE — Op Note (Signed)
Date of Admission: 09/04/2014  Date of Surgery: 09/04/2014   Pre-Op Dx: Cataract Right Eye  Post-Op Dx: Senile Nuclear Cataract Right  Eye,  Dx Code H25.11  Surgeon: Tonny Branch, M.D.  Assistants: None  Anesthesia: Topical with MAC  Indications: Painless, progressive loss of vision with compromise of daily activities.  Surgery: Cataract Extraction with Intraocular lens Implant Right Eye  Discription: The patient had dilating drops and viscous lidocaine placed into the Right eye in the pre-op holding area. After transfer to the operating room, a time out was performed. The patient was then prepped and draped. Beginning with a 54 degree blade a paracentesis port was made at the surgeon's 2 o'clock position. The anterior chamber was then filled with 1% non-preserved lidocaine. This was followed by filling the anterior chamber with Provisc.  A 2.23mm keratome blade was used to make a clear corneal incision at the temporal limbus.  A bent cystatome needle was used to create a continuous tear capsulotomy. Hydrodissection was performed with balanced salt solution on a Fine canula. The lens nucleus was then removed using the phacoemulsification handpiece. Residual cortex was removed with the I&A handpiece. The anterior chamber and capsular bag were refilled with Provisc. A posterior chamber intraocular lens was placed into the capsular bag with it's injector. The implant was positioned with the Kuglan hook. The Provisc was then removed from the anterior chamber and capsular bag with the I&A handpiece. Stromal hydration of the main incision and paracentesis port was performed with BSS on a Fine canula. The wounds were tested for leak which was negative. The patient tolerated the procedure well. There were no operative complications. The patient was then transferred to the recovery room in stable condition.  Complications: None  Specimen: None  EBL: None  Prosthetic device: Hoya iSert 250, power 20.5 D, SN  [id number].

## 2014-09-04 NOTE — Discharge Instructions (Signed)

## 2014-09-04 NOTE — Anesthesia Postprocedure Evaluation (Signed)
  Anesthesia Post-op Note  Patient: Francisco Tanner  Procedure(s) Performed: Procedure(s): CATARACT EXTRACTION PHACO AND INTRAOCULAR LENS PLACEMENT RIGHT EYE CDE=12.15 (Right)  Patient Location: Short Stay  Anesthesia Type:MAC  Level of Consciousness: awake, alert , oriented and patient cooperative  Airway and Oxygen Therapy: Patient Spontanous Breathing  Post-op Pain: none  Post-op Assessment: Post-op Vital signs reviewed, Patient's Cardiovascular Status Stable, Respiratory Function Stable, Patent Airway and Pain level controlled  Post-op Vital Signs: Reviewed and stable  Last Vitals:  Filed Vitals:   09/04/14 1115  BP: 142/76  Temp:   Resp: 18    Complications: No apparent anesthesia complications

## 2014-09-04 NOTE — H&P (Signed)
I have reviewed the H&P, the patient was re-examined, and I have identified no interval changes in medical condition and plan of care since the history and physical of record  

## 2014-09-04 NOTE — Anesthesia Preprocedure Evaluation (Signed)
Anesthesia Evaluation  Patient identified by MRN, date of birth, ID band Patient awake    Reviewed: Allergy & Precautions, H&P , NPO status , Patient's Chart, lab work & pertinent test results  History of Anesthesia Complications Negative for: history of anesthetic complications  Airway Mallampati: I  TM Distance: >3 FB Neck ROM: Full    Dental  (+) Edentulous Upper, Missing, Dental Advisory Given   Pulmonary sleep apnea , COPD COPD inhaler, former smoker, PE 1 ppd x 20 years; quit 5 years ago  PE 2012 breath sounds clear to auscultation        Cardiovascular hypertension, Pt. on medications + Peripheral Vascular Disease and +CHF + dysrhythmias Atrial Fibrillation Rhythm:Regular Rate:Normal     Neuro/Psych PSYCHIATRIC DISORDERS Anxiety    GI/Hepatic GERD-  Medicated,  Endo/Other    Renal/GU      Musculoskeletal   Abdominal   Peds  Hematology   Anesthesia Other Findings   Reproductive/Obstetrics Enlarged prostate                             Anesthesia Physical Anesthesia Plan  ASA: III  Anesthesia Plan: MAC   Post-op Pain Management:    Induction: Intravenous  Airway Management Planned: Simple Face Mask  Additional Equipment:   Intra-op Plan:   Post-operative Plan:   Informed Consent: I have reviewed the patients History and Physical, chart, labs and discussed the procedure including the risks, benefits and alternatives for the proposed anesthesia with the patient or authorized representative who has indicated his/her understanding and acceptance.     Plan Discussed with:   Anesthesia Plan Comments:         Anesthesia Quick Evaluation

## 2014-09-05 ENCOUNTER — Encounter (HOSPITAL_COMMUNITY): Payer: Self-pay | Admitting: Ophthalmology

## 2016-01-08 ENCOUNTER — Encounter: Payer: Self-pay | Admitting: Cardiology

## 2016-01-16 ENCOUNTER — Encounter: Payer: Self-pay | Admitting: Cardiology

## 2016-01-16 ENCOUNTER — Ambulatory Visit (INDEPENDENT_AMBULATORY_CARE_PROVIDER_SITE_OTHER): Payer: Medicare Other | Admitting: Cardiology

## 2016-01-16 VITALS — BP 122/64 | HR 90 | Ht 67.5 in | Wt 222.0 lb

## 2016-01-16 DIAGNOSIS — I481 Persistent atrial fibrillation: Secondary | ICD-10-CM

## 2016-01-16 DIAGNOSIS — I4819 Other persistent atrial fibrillation: Secondary | ICD-10-CM

## 2016-01-16 NOTE — Progress Notes (Signed)
HPI The patient presents for followup of atrial flutter and diastolic heart failure and atrial flutter.   I last saw him in 2015.  Since that time he has become more limited by back pain and hip pain. He gets around slowly with a cane. He lives alone and has no family in the area. He does have chronic O2 that he wears at home. He has dyspnea at times. He's not describing however PND or orthopnea. Of note he's in atrial fibrillation today and really wouldn't know. He says sometimes if he walks a long distance he might notice his heart racing that's also because he has significant pain with that. She doesn't describe any presyncope or syncope. He doesn't describe chest pressure, neck or arm discomfort. He's had a slow weight gain but no edema.   No Known Allergies  Current Outpatient Prescriptions  Medication Sig Dispense Refill  . amLODipine (NORVASC) 10 MG tablet Take 10 mg by mouth daily.    . budesonide-formoterol (SYMBICORT) 160-4.5 MCG/ACT inhaler Inhale 2 puffs into the lungs 2 (two) times daily.     . fluticasone furoate-vilanterol (BREO ELLIPTA) 100-25 MCG/INH AEPB Inhale 1 puff into the lungs daily.     [name] HYDROcodone-acetaminophen (NORCO) 7.5-325 MG tablet Take 1 tablet by mouth every 6 (six) hours as needed for moderate pain.    [name] losartan (COZAAR) 50 MG tablet Take 50 mg by mouth daily.    [name] omeprazole (PRILOSEC) 20 MG capsule Take 20 mg by mouth daily.    [name] oxybutynin (DITROPAN) 5 MG tablet Take 5 mg by mouth at bedtime.    . Rivaroxaban (XARELTO) 20 MG TABS Take 1 tablet (20 mg total) by mouth daily. 30 tablet 11  . sodium chloride (OCEAN) 0.65 % nasal spray Place 1 spray into the nose as needed. For dryness    . Tamsulosin HCl (FLOMAX) 0.4 MG CAPS Take 0.4 mg by mouth at bedtime.      [name] zolpidem (AMBIEN) 10 MG tablet Take 10 mg by mouth at bedtime.      No current facility-administered medications for this visit.    Past Medical History  Diagnosis Date  . COPD (chronic  obstructive pulmonary disease) ([place])     Wert. PFTs 10/09/08 FEV1 1.39 (44%) ratio 40, DLCO 63%. 16% improvement after bronchodilator. overnight pulse ox 02/28/09 5:55m pent with sat< 89 sleeping > declined further o2 07/05/09. HFA 75% 04/23/09.   [name] Atrial flutter ([place])     a. echo 09/28/11 EF 50-55%, mild RAE, RV mildly dilated, systolic fxn mild-mod reduced;  b. 01/2012 s/p EPS & RFCA.  [name] Abnormal CT scan     SPN right mid zone. first detected 08/16/08; see CT [facility]   . Weight gain     target weight 196; BMI <30  . Lung nodule   . Hypertension   . Chronic diastolic CHF (congestive heart failure) ([place])   . Prostate enlargement   . GERD (gastroesophageal reflux disease)   . Claustrophobia   . Sleep apnea     Stop Bang score of 5. Pt has had sleep study and was told he didn't have Sleep Apnea.    Past Surgical History  Procedure Laterality Date  . Hip replacement surgery      L   . Kyphosis surgery    . Back surgery      x4  . Knee surgery Left   . Cardioversion      in past  . Incision and drainage hip  09/24/2011    Procedure: IRRIGATION AND DEBRIDEMENT HIP;  Surgeon: Gearlean Alf, MD;  Location: [facility];  Service: Orthopedics;  Laterality: Left;  . Joint replacement    . A flutter ablation N/A 02/02/2012    Procedure: ABLATION A FLUTTER;  Surgeon: Deboraha Sprang, MD;  Location: [facility] CATH LAB;  Service: Cardiovascular;  Laterality: N/A;  . Cataract extraction w/phaco Right 09/04/2014    Procedure: CATARACT EXTRACTION PHACO AND INTRAOCULAR LENS PLACEMENT RIGHT EYE CDE=12.15;  Surgeon: Tonny Branch, MD;  Location: [facility];  Service: Ophthalmology;  Laterality: Right;    ROS:  As stated in the HPI and negative for all other systems.  PHYSICAL EXAM BP 122/64 mmHg  Pulse 90  Ht 5' 7.5" (1.715 m)  Wt 222 lb (100.699 kg)  BMI 34.24 kg/m2 GEN: No distress  NECK: No jugular venous distention at 90 degrees, waveform within normal limits, carotid upstroke brisk and symmetric, no bruits, no  thyromegaly  LUNGS: Clear to auscultation bilaterally  BACK: No CVA tenderness  CHEST: Unremarkable  HEART: S1 and S2 within normal limits, no S3, no S4, no clicks, no rubs, no murmurs  ABD: Positive bowel sounds normal in frequency in pitch, no bruits, no rebound, no guarding, unable to assess midline mass or bruit with the patient seated.  EXT: 2 plus pulses upper and diminished bilateral lower, trace edema, no cyanosis no clubbing   EKG:  Atrial fibrillation, rate 103, right bundle branch block, no acute ST-T wave changes. 01/16/2016  ASSESSMENT AND PLAN  Chronic Diastolic CHF  He seems to be euvolemic.  At this point, no change in therapy is indicated.   No further cardiovascular testing is indicated.    Atrial Flutter  He is status post ablation.  However, now he has recurrent fibrillation.  He is not really feeling this.  He tolerates blood thinner.  I will plan a 24 hour Holter.  Meds will be adjusted to treat his rate.    Hypertension  The blood pressure is at target.  No change in therapy is indicated.

## 2016-01-16 NOTE — Patient Instructions (Addendum)
Medication Instructions:  The current medical regimen is effective;  continue present plan and medications.  Testing/Procedures: Your physician has recommended that you wear a holter monitor for 24 hours. This will be placed on at [facility] on Friday 6/23 at 2:30 pm.  Holter monitors are medical devices that record the heart's electrical activity. Doctors most often use these monitors to diagnose arrhythmias. Arrhythmias are problems with the speed or rhythm of the heartbeat. The monitor is a small, portable device. You can wear one while you do your normal daily activities. This is usually used to diagnose what is causing palpitations/syncope (passing out).   Follow-Up: Follow up in 6 weeks with Dr Percival Spanish.  If you need a refill on your cardiac medications before your next appointment, please call your pharmacy.  Thank you for choosing [facility]!!

## 2016-01-17 ENCOUNTER — Ambulatory Visit (INDEPENDENT_AMBULATORY_CARE_PROVIDER_SITE_OTHER): Payer: Medicare Other

## 2016-01-17 ENCOUNTER — Other Ambulatory Visit: Payer: Self-pay | Admitting: Orthopedic Surgery

## 2016-01-17 DIAGNOSIS — M25552 Pain in left hip: Secondary | ICD-10-CM | POA: Diagnosis not present

## 2016-01-17 DIAGNOSIS — R52 Pain, unspecified: Secondary | ICD-10-CM

## 2016-01-17 DIAGNOSIS — I4892 Unspecified atrial flutter: Secondary | ICD-10-CM | POA: Diagnosis not present

## 2016-01-17 NOTE — Progress Notes (Signed)
  Patient presents to office today per Dr Percival Spanish for 24hr Holter placement. Holter placed with no problems. Directions given to return to office tomorrow after 24hrs so monitor and be removed. Patient verbalized understanding

## 2016-01-18 ENCOUNTER — Ambulatory Visit: Payer: Self-pay

## 2016-01-22 ENCOUNTER — Other Ambulatory Visit: Payer: Self-pay | Admitting: *Deleted

## 2016-01-22 DIAGNOSIS — I4819 Other persistent atrial fibrillation: Secondary | ICD-10-CM

## 2016-01-25 ENCOUNTER — Telehealth: Payer: Self-pay | Admitting: Cardiology

## 2016-01-25 ENCOUNTER — Telehealth: Payer: Self-pay | Admitting: *Deleted

## 2016-01-25 MED ORDER — DILTIAZEM HCL ER COATED BEADS 120 MG PO CP24: 120.0000 mg | ORAL_CAPSULE | Freq: Every day | ORAL | Status: DC

## 2016-01-25 NOTE — Telephone Encounter (Signed)
Pt aware of his monitor.

## 2016-01-25 NOTE — Telephone Encounter (Signed)
F/u Message   Pt returning RN call about results. Please call back to discuss Pt states his brother was contacted and pt does not know why he was not called.

## 2016-01-25 NOTE — Telephone Encounter (Signed)
-----   Message from Minus Breeding, MD sent at 01/25/2016 10:24 AM EDT ----- Atrial fib with increased rate.  Stop Norvasc and start Cardizem 120 mg daily.  Sig one PO daily.  Disp number 31 with 11 refills.

## 2016-01-25 NOTE — Telephone Encounter (Signed)
Pt aware of his holter result, amlodipine was d/c and cardizem 120 mg was send into pt pharmacy #[phone] refills.

## 2016-02-04 ENCOUNTER — Telehealth: Payer: Self-pay

## 2016-02-04 MED ORDER — AMLODIPINE BESYLATE 10 MG PO TABS: 10.0000 mg | ORAL_TABLET | Freq: Every day | ORAL | Status: DC

## 2016-02-04 MED ORDER — METOPROLOL SUCCINATE ER 25 MG PO TB24: 25.0000 mg | ORAL_TABLET | Freq: Every day | ORAL | Status: DC

## 2016-02-04 NOTE — Telephone Encounter (Signed)
Returned call to patient. Gave him Dr Doug Sou recommendations. He verbalized understanding to restart his amlodipine 10 mg by mouth daily and to take Toprol XL 25 mg once a day. Patient very appreciative for the call back.  RX sent to verified pharmacy.

## 2016-02-04 NOTE — Addendum Note (Signed)
Addended by: Vanessa Ralphs on: 02/04/2016 05:32 PM   Modules accepted: Orders, Medications

## 2016-02-04 NOTE — Telephone Encounter (Signed)
Returned call to patient. Patient says he is not able to tolerate the diltiazem. He started in on July 1st and did fine until last Thursday and Friday.  About 30 min after taking the medication, he experienced severe heart pounding like his heart was beating out of his chest, he recorded his HR at 105. He said he's not sure how long it last but it was for several minutes. He did feel dizzy at that time. He decided not to take the medicine on Saturday or Sunday and does not want to take it today either. BP yesterday morning was 152/89, HR 92 and this morning 133/80, HR 79. At this time he denies CP, palpitations or dizziness, or N/V. He said he was tried on the medication several years ago and was unable to take it then but cannot remember why.  Will route to Dr Percival Spanish for advice.

## 2016-02-04 NOTE — Telephone Encounter (Signed)
Patient calling in with concerns about diltiazem. He would like a call back today to discuss possibility of changing to a different medication. I transferred patient to NL triage and also sent a message to nurse/covering CMA

## 2016-02-04 NOTE — Telephone Encounter (Signed)
I would stop diltiazem. Go back to Norvasc he was taking before. Start Toprol XL 25 mg daily.  Case Vassell Martinique MD, [facility]

## 2016-02-04 NOTE — Telephone Encounter (Signed)
Routing to Dr Martinique, DOD, Dr Percival Spanish out of the office.

## 2016-02-04 NOTE — Telephone Encounter (Signed)
Message being handle in different encounter thru triage

## 2016-02-14 ENCOUNTER — Ambulatory Visit (INDEPENDENT_AMBULATORY_CARE_PROVIDER_SITE_OTHER): Payer: Medicare Other | Admitting: Otolaryngology

## 2016-02-14 DIAGNOSIS — D3709 Neoplasm of uncertain behavior of other specified sites of the oral cavity: Secondary | ICD-10-CM | POA: Diagnosis not present

## 2016-02-25 NOTE — Progress Notes (Signed)
HPI The patient presents for followup of atrial flutter and diastolic heart failure and atrial flutter.   I last saw him in June.  2015.  He is limited by back pain and hip pain. He gets around slowly with a cane. He lives alone and has no family in the area. He has chronic O2 that he wears at home.   When I saw him he was having heart racing walking a long distance.  I tried to start him on Dilt stopping his Norvasc.  He called and did not tolerate this having increased heart pounding.  He was started back on his Norvasc. He was also started on metoprolol.   He returns for follow up.  He has many somatic complaints. His muscle aches and back pains joint discomfort and fatigue. He did have a Holter prior to my start Cardizem which showed some rapid rates that he says at home rates in the 60s to 90s on the Norvasc   No Known Allergies  Current Outpatient Prescriptions  Medication Sig Dispense Refill  . amLODipine (NORVASC) 10 MG tablet Take 1 tablet (10 mg total) by mouth daily. 30 tablet 11  . budesonide-formoterol (SYMBICORT) 160-4.5 MCG/ACT inhaler Inhale 2 puffs into the lungs 2 (two) times daily.     . fluticasone furoate-vilanterol (BREO ELLIPTA) 100-25 MCG/INH AEPB Inhale 1 puff into the lungs daily.     [name] HYDROcodone-acetaminophen (NORCO) 7.5-325 MG tablet Take 1 tablet by mouth every 6 (six) hours as needed for moderate pain.    [name] losartan (COZAAR) 50 MG tablet Take 50 mg by mouth daily.    . metoprolol succinate (TOPROL XL) 25 MG 24 hr tablet Take 1 tablet (25 mg total) by mouth daily. 30 tablet 11  . omeprazole (PRILOSEC) 20 MG capsule Take 20 mg by mouth daily.    . Rivaroxaban (XARELTO) 20 MG TABS Take 1 tablet (20 mg total) by mouth daily. 30 tablet 11  . sodium chloride (OCEAN) 0.65 % nasal spray Place 1 spray into the nose as needed. For dryness    . zolpidem (AMBIEN) 10 MG tablet Take 10 mg by mouth at bedtime.      No current facility-administered medications for this visit.       Past Medical History:  Diagnosis Date  . Abnormal CT scan    SPN right mid zone. first detected 08/16/08; see [facility]   . Atrial flutter ([place])    a. echo 09/28/11 EF 50-55%, mild RAE, RV mildly dilated, systolic fxn mild-mod reduced;  b. 01/2012 s/p EPS & RFCA.  [name] Chronic diastolic CHF (congestive heart failure) ([place])   . Claustrophobia   . COPD (chronic obstructive pulmonary disease) ([place])    Wert. PFTs 10/09/08 FEV1 1.39 (44%) ratio 40, DLCO 63%. 16% improvement after bronchodilator. overnight pulse ox 02/28/09 5:43m pent with sat< 89 sleeping > declined further o2 07/05/09. HFA 75% 04/23/09.   [name] GERD (gastroesophageal reflux disease)   . Hypertension   . Lung nodule   . Prostate enlargement   . Sleep apnea    Stop Bang score of 5. Pt has had sleep study and was told he didn't have Sleep Apnea.  . Weight gain    target weight 196; BMI <30    Past Surgical History:  Procedure Laterality Date  . A FLUTTER ABLATION N/A 02/02/2012   Procedure: ABLATION A FLUTTER;  Surgeon: Deboraha Sprang, MD;  Location: [facility] CATH LAB;  Service: Cardiovascular;  Laterality: N/A;  . BACK  SURGERY     x4  . CARDIOVERSION     in past  . CATARACT EXTRACTION W/PHACO Right 09/04/2014   Procedure: CATARACT EXTRACTION PHACO AND INTRAOCULAR LENS PLACEMENT RIGHT EYE CDE=12.15;  Surgeon: Tonny Branch, MD;  Location: [facility] ORS;  Service: Ophthalmology;  Laterality: Right;  . hip replacement surgery     L   . INCISION AND DRAINAGE HIP  09/24/2011   Procedure: IRRIGATION AND DEBRIDEMENT HIP;  Surgeon: Gearlean Alf, MD;  Location: [facility] ORS;  Service: Orthopedics;  Laterality: Left;  . JOINT REPLACEMENT    . KNEE SURGERY Left   . KYPHOSIS SURGERY      ROS:  As stated in the HPI and negative for all other systems.  PHYSICAL EXAM BP 120/80   Pulse 85   Ht 5' 7.5" (1.715 m)   Wt 220 lb (99.8 kg)   BMI 33.95 kg/m  GEN: No distress  NECK: No jugular venous distention at 90 degrees, waveform within normal limits, carotid  upstroke brisk and symmetric, no bruits, no thyromegaly  LUNGS: Clear to auscultation bilaterally  BACK: No CVA tenderness  CHEST: Unremarkable  HEART: S1 and S2 within normal limits, no S3,  no clicks, no rubs, no murmurs , irregular ABD: Positive bowel sounds normal in frequency in pitch, no bruits, no rebound, no guarding, unable to assess midline mass or bruit with the patient seated.  EXT: 2 plus pulses upper and diminished bilateral lower, trace edema, no cyanosis no clubbing    ASSESSMENT AND PLAN  Chronic Diastolic CHF  He seems to be euvolemic.  At this point, no change in therapy is indicated.   No further cardiovascular testing is indicated.   Atrial Flutter  He is status post ablation.  However, now he has recurrent fibrillation.  He is not really feeling this.  I strongly believe that trying to get him back to normal rhythm would not likely make him feel better. I think he really doesn't feel this rhythm and that his complaints are multifactorial. However, I will do further evaluation as below.  Cardiomyopathy He's had a mildly reduced ejection fraction past. I'm going to get an echocardiogram. This will allow me to look chamber sizes well.    Hypertension  The blood pressure is at target.  No change in therapy is indicated.

## 2016-02-27 ENCOUNTER — Encounter: Payer: Self-pay | Admitting: Cardiology

## 2016-02-27 ENCOUNTER — Ambulatory Visit (INDEPENDENT_AMBULATORY_CARE_PROVIDER_SITE_OTHER): Payer: Medicare Other | Admitting: Cardiology

## 2016-02-27 VITALS — BP 120/80 | HR 85 | Ht 67.5 in | Wt 220.0 lb

## 2016-02-27 DIAGNOSIS — I5032 Chronic diastolic (congestive) heart failure: Secondary | ICD-10-CM

## 2016-02-27 DIAGNOSIS — I481 Persistent atrial fibrillation: Secondary | ICD-10-CM

## 2016-02-27 DIAGNOSIS — I4819 Other persistent atrial fibrillation: Secondary | ICD-10-CM

## 2016-02-27 NOTE — Patient Instructions (Addendum)
Medication Instructions:  The current medical regimen is effective;  continue present plan and medications.  Testing/Procedures: Your physician has requested that you have an echocardiogram. Echocardiography is a painless test that uses sound waves to create images of your heart. It provides your doctor with information about the size and shape of your heart and how well your heart's chambers and valves are working. This procedure takes approximately one hour. There are no restrictions for this procedure. Please report to the Main Entrance of [facility] at 12:45 August 10th, 2017. If you need to reschedule please call [phone].  Follow-Up: Follow up in 6 months with Dr. Percival Spanish in [place].  You will receive a letter in the mail 2 months before you are due.  Please call us when you receive this letter to schedule your follow up appointment.  If you need a refill on your cardiac medications before your next appointment, please call your pharmacy.  Thank you for choosing [facility]!!

## 2016-03-03 DIAGNOSIS — E785 Hyperlipidemia, unspecified: Secondary | ICD-10-CM

## 2016-03-03 HISTORY — DX: Hyperlipidemia, unspecified: E78.5

## 2016-03-06 ENCOUNTER — Ambulatory Visit (HOSPITAL_COMMUNITY)
Admission: RE | Admit: 2016-03-06 | Discharge: 2016-03-06 | Disposition: A | Discharge status: Home / Self Care | Payer: Medicare Other | Source: Ambulatory Visit | Attending: Cardiology | Admitting: Cardiology

## 2016-03-06 DIAGNOSIS — I110 Hypertensive heart disease with heart failure: Secondary | ICD-10-CM | POA: Diagnosis not present

## 2016-03-06 DIAGNOSIS — I358 Other nonrheumatic aortic valve disorders: Secondary | ICD-10-CM | POA: Insufficient documentation

## 2016-03-06 DIAGNOSIS — J449 Chronic obstructive pulmonary disease, unspecified: Secondary | ICD-10-CM | POA: Diagnosis not present

## 2016-03-06 DIAGNOSIS — I429 Cardiomyopathy, unspecified: Secondary | ICD-10-CM | POA: Insufficient documentation

## 2016-03-06 DIAGNOSIS — I5032 Chronic diastolic (congestive) heart failure: Secondary | ICD-10-CM | POA: Insufficient documentation

## 2016-03-06 LAB — ECHOCARDIOGRAM COMPLETE
AVLVOTPG: 2 mmHg
CHL CUP DOP CALC LVOT VTI: 13.8 cm
EWDT: 246 ms
FS: 31 % (ref 28–44)
IVS/LV PW RATIO, ED: 0.94
LA ID, A-P, ES: 37 mm
LA diam index: 1.67 cm/m2
LA vol: 53.2 mL
LAVOLA4C: 51.8 mL
LAVOLIN: 24 mL/m2
LEFT ATRIUM END SYS DIAM: 37 mm
LV PW d: 12.8 mm — AB (ref 0.6–1.1)
LVOT area: 2.54 cm2
LVOT diameter: 18 mm
LVOT peak vel: 73.1 cm/s
LVOTSV: 35 mL
MV Dec: 246
MV Peak grad: 2 mmHg
MV pk A vel: 30.2 m/s
MVPKEVEL: 71.3 m/s
TAPSE: 22.6 mm

## 2016-03-06 NOTE — Progress Notes (Signed)
*  PRELIMINARY RESULTS* Echocardiogram 2D Echocardiogram has been performed.  Francisco Tanner 03/06/2016, 1:39 PM

## 2016-03-11 ENCOUNTER — Telehealth: Payer: Self-pay | Admitting: *Deleted

## 2016-03-11 NOTE — Telephone Encounter (Addendum)
Spoke with pt, aware of echo results. He has not been able to f/u with dr wert because of transportation issues. He would prefer to see someone in [place]. Made pt aware there are no pulmonary doctors in [place] but dr Sinda Du is in [place]. Pt agrees that he can get to [place]. Left message for dr Luan Pulling office to return my call.

## 2016-03-12 NOTE — Telephone Encounter (Signed)
Left message for dr Luan Pulling office to please call me

## 2016-03-13 ENCOUNTER — Encounter (HOSPITAL_COMMUNITY): Payer: Self-pay | Admitting: Emergency Medicine

## 2016-03-13 ENCOUNTER — Emergency Department (HOSPITAL_COMMUNITY)
Admission: EM | Admit: 2016-03-13 | Discharge: 2016-03-13 | Disposition: A | Discharge status: Home / Self Care | Payer: Medicare Other | Attending: Emergency Medicine | Admitting: Emergency Medicine

## 2016-03-13 DIAGNOSIS — I5032 Chronic diastolic (congestive) heart failure: Secondary | ICD-10-CM | POA: Insufficient documentation

## 2016-03-13 DIAGNOSIS — S0993XA Unspecified injury of face, initial encounter: Secondary | ICD-10-CM | POA: Diagnosis present

## 2016-03-13 DIAGNOSIS — Y999 Unspecified external cause status: Secondary | ICD-10-CM | POA: Insufficient documentation

## 2016-03-13 DIAGNOSIS — I110 Hypertensive heart disease with heart failure: Secondary | ICD-10-CM | POA: Insufficient documentation

## 2016-03-13 DIAGNOSIS — Z87891 Personal history of nicotine dependence: Secondary | ICD-10-CM | POA: Diagnosis not present

## 2016-03-13 DIAGNOSIS — Y929 Unspecified place or not applicable: Secondary | ICD-10-CM | POA: Insufficient documentation

## 2016-03-13 DIAGNOSIS — Y939 Activity, unspecified: Secondary | ICD-10-CM | POA: Diagnosis not present

## 2016-03-13 DIAGNOSIS — K1379 Other lesions of oral mucosa: Secondary | ICD-10-CM

## 2016-03-13 DIAGNOSIS — X58XXXA Exposure to other specified factors, initial encounter: Secondary | ICD-10-CM | POA: Insufficient documentation

## 2016-03-13 DIAGNOSIS — J449 Chronic obstructive pulmonary disease, unspecified: Secondary | ICD-10-CM | POA: Insufficient documentation

## 2016-03-13 DIAGNOSIS — S01502A Unspecified open wound of oral cavity, initial encounter: Secondary | ICD-10-CM | POA: Diagnosis not present

## 2016-03-13 DIAGNOSIS — Z79899 Other long term (current) drug therapy: Secondary | ICD-10-CM | POA: Insufficient documentation

## 2016-03-13 MED ORDER — AMOXICILLIN-POT CLAVULANATE 875-125 MG PO TABS: 1.0000 | ORAL_TABLET | Freq: Two times a day (BID) | ORAL | 0 refills | Status: DC

## 2016-03-13 MED ORDER — AMOXICILLIN 500 MG PO CAPS: 500.0000 mg | ORAL_CAPSULE | Freq: Three times a day (TID) | ORAL | 0 refills | Status: DC

## 2016-03-13 NOTE — ED Notes (Signed)
EDP at bedside  

## 2016-03-13 NOTE — ED Triage Notes (Signed)
Pt woke to mouth bleeding from oral surgery on Friday. Bleeding controlled at this time.

## 2016-03-13 NOTE — ED Provider Notes (Signed)
[facility] DEPT Provider Note   CSN: [id number] Arrival date & time: 03/13/16  0607     History   Chief Complaint Chief Complaint  Patient presents with  . Dental Injury    HPI Francisco Tanner is a 70 y.o. male.  HPI  Pt was seen at [facility]. Per pt and his family, c/o gradual onset and resolution of one episode of "mouth bleeding" that began this morning PTA. Pt states he woke up approximately 0430 with his right lower lip bleeding. Pt states he "had some kind of cyst" removed from his right inner lower lip last Friday (6 days ago). Pt states he was told to hold his xarelto until Monday (3 days ago). Pt states he has been taking his xarelto since Monday. Pt states he held pressure to his lower lip until the bleeding stopped. States "maybe the stitches came out or something." Denies pain, no injury, no other areas of bleeding.    Past Medical History:  Diagnosis Date  . Abnormal CT scan    SPN right mid zone. first detected 08/16/08; see CT [facility]   . Atrial flutter ([place])    a. echo 09/28/11 EF 50-55%, mild RAE, RV mildly dilated, systolic fxn mild-mod reduced;  b. 01/2012 s/p EPS & RFCA.  [name] Chronic diastolic CHF (congestive heart failure) ([place])   . Claustrophobia   . COPD (chronic obstructive pulmonary disease) ([place])    Wert. PFTs 10/09/08 FEV1 1.39 (44%) ratio 40, DLCO 63%. 16% improvement after bronchodilator. overnight pulse ox 02/28/09 5:51m pent with sat< 89 sleeping > declined further o2 07/05/09. HFA 75% 04/23/09.   [name] GERD (gastroesophageal reflux disease)   . Hypertension   . Lung nodule   . Prostate enlargement   . Sleep apnea    Stop Bang score of 5. Pt has had sleep study and was told he didn't have Sleep Apnea.  . Weight gain    target weight 196; BMI <30    Patient Active Problem List   Diagnosis Date Noted  . Community acquired pneumonia 04/09/2012  . Hypertension 01/28/2012  . History of pulmonary embolism 01/28/2012  . Atrial flutter with rapid ventricular response  ([place]) 01/28/2012  . Cellulitis 01/28/2012  . Hematoma 09/22/2011  . Anticoagulant long-term use 09/22/2011  . Morbid obesity ([place]) 07/01/2011  . Pulmonary embolism ([place]) 02/19/2011  . Chronic diastolic heart failure ([place]) 02/19/2011  . PVD 02/06/2010  . 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 Atrial flutter ([place]) 09/08/2008  . COPD GOLD III with reversible component 09/08/2008  . PULMONARY NODULE 09/08/2008    Past Surgical History:  Procedure Laterality Date  . A FLUTTER ABLATION N/A 02/02/2012   Procedure: ABLATION A FLUTTER;  Surgeon: Deboraha Sprang, MD;  Location[facility] CATH LAB;  Service: Cardiovascular;  Laterality: N/A;  . BACK SURGERY     x4  . CARDIOVERSION     in past  . CATARACT EXTRACTION W/PHACO Right 09/04/2014   Procedure: CATARACT EXTRACTION PHACO AND INTRAOCULAR LENS PLACEMENT RIGHT EYE CDE=12.15;  Surgeon: Tonny Branch, MD;  Location: [facility] ORS;  Service: Ophthalmology;  Laterality: Right;  . hip replacement surgery     L   . INCISION AND DRAINAGE HIP  09/24/2011   Procedure: IRRIGATION AND DEBRIDEMENT HIP;  Surgeon: Gearlean Alf, MD;  Location: [facility] ORS;  Service: Orthopedics;  Laterality: Left;  . JOINT REPLACEMENT    . KNEE SURGERY Left   . KYPHOSIS SURGERY         Home Medications  Prior to Admission medications   Medication Sig Start Date End Date Taking? Authorizing Provider  amLODipine (NORVASC) 10 MG tablet Take 1 tablet (10 mg total) by mouth daily. 02/04/16   Peter M Martinique, MD  budesonide-formoterol [facility]) 160-4.5 MCG/ACT inhaler Inhale 2 puffs into the lungs 2 (two) times daily.     Historical Provider, MD  fluticasone furoate-vilanterol (BREO ELLIPTA) 100-25 MCG/INH AEPB Inhale 1 puff into the lungs daily.  10/16/15   Historical Provider, MD  HYDROcodone-acetaminophen (NORCO) 7.5-325 MG tablet Take 1 tablet by mouth every 6 (six) hours as needed for moderate pain.    Historical Provider, MD  losartan (COZAAR) 50 MG tablet Take 50 mg by mouth  daily.    Historical Provider, MD  metoprolol succinate (TOPROL XL) 25 MG 24 hr tablet Take 1 tablet (25 mg total) by mouth daily. 02/04/16   Peter M Martinique, MD  omeprazole (PRILOSEC) 20 MG capsule Take 20 mg by mouth daily.    Historical Provider, MD  Rivaroxaban (XARELTO) 20 MG TABS Take 1 tablet (20 mg total) by mouth daily. 09/01/12   Minus Breeding, MD  sodium chloride (OCEAN) 0.65 % nasal spray Place 1 spray into the nose as needed. For dryness    Historical Provider, MD  zolpidem (AMBIEN) 10 MG tablet Take 10 mg by mouth at bedtime.     Historical Provider, MD    Family History Family History  Problem Relation Age of Onset  . Diabetes Father     DM   . Cancer Neg Hx     Social History Social History  Substance Use Topics  . Smoking status: Former Smoker    Packs/day: 0.50    Years: 20.00    Types: Cigarettes    Quit date: 07/28/2002  . Smokeless tobacco: Never Used  . Alcohol use Yes     Comment: occasionally at a social gathering     Allergies   Review of patient's allergies indicates no known allergies.   Review of Systems Review of Systems ROS: Statement: All systems negative except as marked or noted in the HPI; Constitutional: Negative for fever and chills. ; ; Eyes: Negative for eye pain, redness and discharge. ; ; ENMT: +lip bleeding. Negative for ear pain, hoarseness, nasal congestion, sinus pressure and sore throat. ; ; Cardiovascular: Negative for chest pain, palpitations, diaphoresis, dyspnea and peripheral edema. ; ; Respiratory: Negative for cough, wheezing and stridor. ; ; Gastrointestinal: Negative for nausea, vomiting, diarrhea, abdominal pain, blood in stool, hematemesis, jaundice and rectal bleeding. . ; ; Genitourinary: Negative for dysuria, flank pain and hematuria. ; ; Musculoskeletal: Negative for back pain and neck pain. Negative for swelling and trauma.; ; Skin: Negative for pruritus, rash, abrasions, blisters, bruising and skin lesion.; ; Neuro: Negative  for headache, lightheadedness and neck stiffness. Negative for weakness, altered level of consciousness, altered mental status, extremity weakness, paresthesias, involuntary movement, seizure and syncope.       Physical Exam Updated Vital Signs BP 104/82   Pulse 84   Temp 97.7 F (36.5 C)   Resp 22   Ht 5\' 7"  (1.702 m)   Wt 214 lb (97.1 kg)   SpO2 95%   BMI 33.52 kg/m   Physical Exam 0745: Physical examination:  Nursing notes reviewed; Vital signs and O2 SAT reviewed;  Constitutional: Well developed, Well nourished, Well hydrated, In no acute distress; Head:  Normocephalic, atraumatic; Eyes: EOMI, PERRL, No scleral icterus; ENMT: +right inner lower lip open surgical wound, hemostatic. No hematoma,  no drainage, no erythema. Mouth and pharynx normal, Mucous membranes moist; Neck: Supple, Full range of motion, No lymphadenopathy; Cardiovascular: Regular rate and rhythm, No gallop; Respiratory: Breath sounds clear & equal bilaterally, No wheezes.  Speaking full sentences with ease, Normal respiratory effort/excursion; Chest: Nontender, Movement normal; Abdomen: Soft, Nontender, Nondistended, Normal bowel sounds; Extremities: Pulses normal, No tenderness, No edema, No calf edema or asymmetry.; Neuro: AA&Ox3, Major CN grossly intact.  Speech clear. No gross focal motor or sensory deficits in extremities. Climbs on and off stretcher easily by himself. Gait steady with his cane.; Skin: Color normal, Warm, Dry.   ED Treatments / Results  Labs (all labs ordered are listed, but only abnormal results are displayed)   EKG  EKG Interpretation None       Radiology   Procedures Procedures (including critical care time)  Medications Ordered in ED    Initial Impression / Assessment and Plan / ED Course  I have reviewed the triage vital signs and the nursing notes.  Pertinent labs & imaging results that were available during my care of the patient were reviewed by me and considered in my  medical decision making (see chart for details).  MDM Reviewed: previous chart, nursing note and vitals   0805:  Right lower lip wound open, hemostatic. No hematoma, no erythema, no drainage. T/C to ENT Dr. Benjamine Mola, case discussed, including:  HPI, pertinent PM/SHx, VS/PE, dx testing, ED course and treatment:  Agrees with keeping wound open, have pt stop xarelto until Monday, rx augmentin, f/u office. Dx, as well as d/w ENT MD, d/w pt and family.  Questions answered.  Verb understanding, agreeable to d/c home with outpt f/u.   Final Clinical Impressions(s) / ED Diagnoses   Final diagnoses:  None    New Prescriptions New Prescriptions   AMOXICILLIN-CLAVULANATE (AUGMENTIN) 875-125 MG TABLET    Take 1 tablet by mouth every 12 (twelve) hours.     Francine Graven, DO 03/15/16 1504

## 2016-03-13 NOTE — ED Notes (Addendum)
Pt and family came out to nurses station asking when EDP will come evaluate pt. Informed them that new EDP arrived at 7am and will be around shortly. Provided comfort measures.

## 2016-03-13 NOTE — Discharge Instructions (Signed)
Take the prescription as directed. Your ENT doctor wants you to stop taking your xarelto until Monday. Call your ENT doctor today to schedule a follow up appointment within the next week.  Return to the Emergency Department immediately sooner if worsening.

## 2016-03-17 ENCOUNTER — Ambulatory Visit (INDEPENDENT_AMBULATORY_CARE_PROVIDER_SITE_OTHER): Payer: Medicare Other | Admitting: Otolaryngology

## 2016-06-16 DIAGNOSIS — K219 Gastro-esophageal reflux disease without esophagitis: Secondary | ICD-10-CM | POA: Insufficient documentation

## 2016-06-23 ENCOUNTER — Other Ambulatory Visit: Payer: Self-pay | Admitting: Physical Medicine and Rehabilitation

## 2016-06-23 DIAGNOSIS — M5136 Other intervertebral disc degeneration, lumbar region: Secondary | ICD-10-CM

## 2016-06-25 ENCOUNTER — Ambulatory Visit
Admission: RE | Admit: 2016-06-25 | Discharge: 2016-06-25 | Disposition: A | Discharge status: Home / Self Care | Payer: Medicare Other | Source: Ambulatory Visit | Attending: Physical Medicine and Rehabilitation | Admitting: Physical Medicine and Rehabilitation

## 2016-06-25 DIAGNOSIS — M5136 Other intervertebral disc degeneration, lumbar region: Secondary | ICD-10-CM

## 2016-07-17 ENCOUNTER — Other Ambulatory Visit: Payer: Self-pay | Admitting: Surgery

## 2016-07-17 DIAGNOSIS — I7389 Other specified peripheral vascular diseases: Secondary | ICD-10-CM

## 2016-07-18 ENCOUNTER — Other Ambulatory Visit: Payer: Self-pay | Admitting: *Deleted

## 2016-07-18 DIAGNOSIS — I7389 Other specified peripheral vascular diseases: Secondary | ICD-10-CM

## 2016-09-08 ENCOUNTER — Encounter (HOSPITAL_COMMUNITY): Payer: Medicare Other

## 2016-09-08 ENCOUNTER — Encounter: Payer: Medicare Other | Admitting: Surgery

## 2016-09-19 ENCOUNTER — Encounter: Payer: Self-pay | Admitting: Vascular Surgery

## 2016-09-25 ENCOUNTER — Ambulatory Visit (HOSPITAL_COMMUNITY)
Admission: RE | Admit: 2016-09-25 | Discharge: 2016-09-25 | Disposition: A | Discharge status: Home / Self Care | Payer: Medicare Other | Source: Ambulatory Visit | Attending: Surgery | Admitting: Surgery

## 2016-09-25 ENCOUNTER — Ambulatory Visit (INDEPENDENT_AMBULATORY_CARE_PROVIDER_SITE_OTHER): Payer: Medicare Other | Admitting: Vascular Surgery

## 2016-09-25 ENCOUNTER — Encounter: Payer: Self-pay | Admitting: Vascular Surgery

## 2016-09-25 VITALS — BP 129/80 | HR 94 | Temp 97.0°F | Resp 22 | Ht 67.0 in | Wt 218.0 lb

## 2016-09-25 DIAGNOSIS — I7389 Other specified peripheral vascular diseases: Secondary | ICD-10-CM | POA: Diagnosis present

## 2016-09-25 DIAGNOSIS — I714 Abdominal aortic aneurysm, without rupture, unspecified: Secondary | ICD-10-CM

## 2016-09-25 NOTE — Progress Notes (Signed)
Referring Physician: Dr Nelva Bush  Patient name: Francisco Tanner MRN: [id number] DOB: 1945-09-10 Sex: male  REASON FOR CONSULT: Abdominal aortic aneurysm HPI: Francisco Tanner is a 71 y.o. male, found to have abdominal aortic aneurysm on recent MRI of the spine for back pain. He has had chronic back pain for greater than 7 years. He is on home oxygen as needed for COPD and sleep apnea. He denies any family history of aneurysms. He does have a history of atrial fibrillation and is on Xarelto for this. Other medical problems include hypertension chronic left hip arthritis which has required 3 operations both currently stable. He has some occasional abdominal pain around an umbilical hernia.  Past Medical History:  Diagnosis Date  . Abnormal CT scan    SPN right mid zone. first detected 08/16/08; see [facility]   . Atrial flutter ([place])    a. echo 09/28/11 EF 50-55%, mild RAE, RV mildly dilated, systolic fxn mild-mod reduced;  b. 01/2012 s/p EPS & RFCA.  [name] Chronic diastolic CHF (congestive heart failure) ([place])   . Claustrophobia   . COPD (chronic obstructive pulmonary disease) ([place])    Wert. PFTs 10/09/08 FEV1 1.39 (44%) ratio 40, DLCO 63%. 16% improvement after bronchodilator. overnight pulse ox 02/28/09 5:36m pent with sat< 89 sleeping > declined further o2 07/05/09. HFA 75% 04/23/09.   [name] GERD (gastroesophageal reflux disease)   . Hypertension   . Lung nodule   . Prostate enlargement   . Sleep apnea    Stop Bang score of 5. Pt has had sleep study and was told he didn't have Sleep Apnea.  . Weight gain    target weight 196; BMI <30   Past Surgical History:  Procedure Laterality Date  . A FLUTTER ABLATION N/A 02/02/2012   Procedure: ABLATION A FLUTTER;  Surgeon: Deboraha Sprang, MD;  Location: [facility] CATH LAB;  Service: Cardiovascular;  Laterality: N/A;  . BACK SURGERY     x4  . CARDIOVERSION     in past  . CATARACT EXTRACTION W/PHACO Right 09/04/2014   Procedure: CATARACT EXTRACTION PHACO AND INTRAOCULAR LENS  PLACEMENT RIGHT EYE CDE=12.15;  Surgeon: Tonny Branch, MD;  Location: [facility] ORS;  Service: Ophthalmology;  Laterality: Right;  . hip replacement surgery     L   . INCISION AND DRAINAGE HIP  09/24/2011   Procedure: IRRIGATION AND DEBRIDEMENT HIP;  Surgeon: Gearlean Alf, MD;  Location: [facility] ORS;  Service: Orthopedics;  Laterality: Left;  . JOINT REPLACEMENT    . KNEE SURGERY Left   . KYPHOSIS SURGERY      Family History  Problem Relation Age of Onset  . Diabetes Father     DM   . Cancer Neg Hx     SOCIAL HISTORY: Social History   Social History  . Marital status: Legally Separated    Spouse name: N/A  . Number of children: N/A  . Years of education: N/A   Occupational History  . Not on file.   Social History Main Topics  . Smoking status: Former Smoker    Packs/day: 0.50    Years: 20.00    Types: Cigarettes    Quit date: 07/28/2002  . Smokeless tobacco: Never Used  . Alcohol use Yes     Comment: occasionally at a social gathering  . Drug use: No  . Sexual activity: Not on file   Other Topics Concern  . Not on file   Social History Narrative   Separated; children; retired.  No Known Allergies  Current Outpatient Prescriptions  Medication Sig Dispense Refill  . amLODipine (NORVASC) 10 MG tablet Take 1 tablet (10 mg total) by mouth daily. 30 tablet 11  . budesonide-formoterol (SYMBICORT) 160-4.5 MCG/ACT inhaler Inhale 2 puffs into the lungs 2 (two) times daily.     [name] HYDROcodone-acetaminophen (NORCO) 10-325 MG tablet Take 1 tablet by mouth every 6 (six) hours as needed.    [name] losartan (COZAAR) 50 MG tablet Take 50 mg by mouth daily.    . metoprolol succinate (TOPROL XL) 25 MG 24 hr tablet Take 1 tablet (25 mg total) by mouth daily. 30 tablet 11  . omeprazole (PRILOSEC) 20 MG capsule Take 20 mg by mouth daily.    . Rivaroxaban (XARELTO) 20 MG TABS Take 1 tablet (20 mg total) by mouth daily. 30 tablet 11  . sodium chloride (OCEAN) 0.65 % nasal spray Place 1 spray into  the nose as needed. For dryness    . zolpidem (AMBIEN) 10 MG tablet Take 10 mg by mouth at bedtime.      No current facility-administered medications for this visit.     ROS:   General:  No weight loss, Fever, chills  HEENT: No recent headaches, no nasal bleeding, no visual changes, no sore throat  Neurologic: No dizziness, blackouts, seizures. No recent symptoms of stroke or mini- stroke. No recent episodes of slurred speech, or temporary blindness.  Cardiac: No recent episodes of chest pain/pressure, + shortness of breath at rest.  +shortness of breath with exertion.  + history of atrial fibrillation or irregular heartbeat  Vascular: No history of rest pain in feet.  No history of claudication.  No history of non-healing ulcer, No history of DVT   Pulmonary: No home oxygen, no productive cough, no hemoptysis,  No asthma or wheezing  Musculoskeletal:  [X]  Arthritis, [X]  Low back pain,  [X]  Joint pain  Hematologic:No history of hypercoagulable state.  No history of easy bleeding.  No history of anemia  Gastrointestinal: No hematochezia or melena,  No gastroesophageal reflux, no trouble swallowing  Urinary: [ ]  chronic Kidney disease, [ ]  on HD - [ ]  MWF or [ ]  TTHS, [ ]  Burning with urination, [ ]  Frequent urination, [ ]  Difficulty urinating;   Skin: No rashes  Psychological: No history of anxiety,  No history of depression   Physical Examination  Vitals:   09/25/16 1112  BP: 129/80  Pulse: 94  Resp: (!) 22  Temp: 97 F (36.1 C)  TempSrc: Oral  SpO2: (!) 78%  Weight: 218 lb (98.9 kg)  Height: 5\' 7"  (1.702 m)    Body mass index is 34.14 kg/m.  General:  Alert and oriented, no acute distress HEENT: Normal Neck: No bruit or JVD Pulmonary: Clear to auscultation bilaterally Cardiac: Irregularly irregular without murmur Abdomen: Soft, non-tender, non-distended, no mass, umbilical hernia reducible  Skin: No rash Extremity Pulses:  2+ radial, brachial, 2+ left absent  right femoral, absent popliteal and dorsalis pedis, posterior tibial pulses bilaterally Musculoskeletal: No deformity or edema  Neurologic: Upper and lower extremity motor 5/5 and symmetric  DATA:  MRI report dated 06/25/2016 is reviewed which showed right common iliac narrowing and a 2.8 cm aneurysm. The patient had bilateral ABIs performed today which were 0.85 on the left 0.92 on the right. He also had an aortic ultrasound which shows his aneurysm diameter was 3.4 cm.  ASSESSMENT:  Patient with asymptomatic 3.4 cm abdominal aortic aneurysm with elements of right iliac occlusive disease  as well as most likely bilateral superficial femoral artery occlusive disease.  His peripheral arterial disease is asymptomatic. This is most likely due to the fact that his orthopedic issues limit how much he can walk as well as his underlying lung disease. His ABIs showed good perfusion bilaterally. No intervention necessary for this. As far as his aneurysm is concerned we will schedule him for a follow-up CT Angio the abdomen and pelvis in 6 months to better define the anatomy. I discussed with him consideration for repair of the aneurysm reaches 5.5 cm in diameter.   PLAN:  Follow-up with me in CT angiogram abdomen and pelvis 6 months. Patient was informed to let anyone in the emergency room enough he develops severe abdominal or back pain that he has a known abdominal aortic aneurysm.   Ruta Hinds, MD [facility] Office: [phone] Pager: [phone]

## 2016-10-03 NOTE — Addendum Note (Signed)
Addended by: Lianne Cure A on: 10/03/2016 09:15 AM   Modules accepted: Orders

## 2016-11-12 ENCOUNTER — Other Ambulatory Visit: Payer: Self-pay | Admitting: *Deleted

## 2017-01-15 ENCOUNTER — Encounter: Payer: Self-pay | Admitting: Cardiology

## 2017-01-15 ENCOUNTER — Ambulatory Visit (INDEPENDENT_AMBULATORY_CARE_PROVIDER_SITE_OTHER): Payer: Medicare Other | Admitting: Cardiology

## 2017-01-15 VITALS — BP 140/80 | HR 89 | Ht 67.5 in | Wt 219.2 lb

## 2017-01-15 DIAGNOSIS — I5032 Chronic diastolic (congestive) heart failure: Secondary | ICD-10-CM

## 2017-01-15 DIAGNOSIS — I10 Essential (primary) hypertension: Secondary | ICD-10-CM

## 2017-01-15 DIAGNOSIS — I4892 Unspecified atrial flutter: Secondary | ICD-10-CM

## 2017-01-15 DIAGNOSIS — I420 Dilated cardiomyopathy: Secondary | ICD-10-CM

## 2017-01-15 NOTE — Progress Notes (Signed)
HPI The patient presents for followup of atrial flutter and diastolic heart failure and atrial flutter.   I last saw him last year.  He had had atrial flutter with ablation but went back into atrial flutter.  He has been managed with rate control and anticoagulation.  She had an echo last year with normal LV function and some evidence of elevated pulmonary pressure.   He wears O2 at night and PRN.   He says that recently his blood pressure went down and his heart rate went up. He felt very weak. He's had trouble getting around. He actually stopped his Norvasc and his Cozaar. Slowly over the next several days his heart rate and blood pressure in improved. He's very limited by his lungs. He has chronic back and hip pain. He has chronic abdominal pain related to a hernia. He has lots of somatic complaints. However, he's not had any new chest pressure, neck or arm discomfort. He's not having any presyncope or syncope now. He gets around slowly with a cane.  Allergies  Allergen Reactions  . Pravastatin Rash    Elevated heart rate per patient    Current Outpatient Prescriptions  Medication Sig Dispense Refill  . budesonide-formoterol (SYMBICORT) 160-4.5 MCG/ACT inhaler Inhale 2 puffs into the lungs 2 (two) times daily.     . Cyanocobalamin (VITAMIN B-12 IJ) Inject as directed every 30 (thirty) days.    [name] HYDROcodone-acetaminophen (NORCO) 10-325 MG tablet Take 1 tablet by mouth every 6 (six) hours as needed.    . metoprolol succinate (TOPROL XL) 25 MG 24 hr tablet Take 1 tablet (25 mg total) by mouth daily. 30 tablet 11  . omeprazole (PRILOSEC) 20 MG capsule Take 20 mg by mouth daily.    . Rivaroxaban (XARELTO) 20 MG TABS Take 1 tablet (20 mg total) by mouth daily. 30 tablet 11  . sodium chloride (OCEAN) 0.65 % nasal spray Place 1 spray into the nose as needed. For dryness    . zolpidem (AMBIEN) 10 MG tablet Take 10 mg by mouth at bedtime.     . OXYGEN Inhale 2 L into the lungs as needed. Uses 75% of  the time & at bedtime.     No current facility-administered medications for this visit.     Past Medical History:  Diagnosis Date  . Abnormal CT scan    SPN right mid zone. first detected 08/16/08; see CT [facility]   . Atrial flutter ([place])    a. echo 09/28/11 EF 50-55%, mild RAE, RV mildly dilated, systolic fxn mild-mod reduced;  b. 01/2012 s/p EPS & RFCA.  [name] Chronic diastolic CHF (congestive heart failure) ([place])   . Claustrophobia   . COPD (chronic obstructive pulmonary disease) ([place])    Wert. PFTs 10/09/08 FEV1 1.39 (44%) ratio 40, DLCO 63%. 16% improvement after bronchodilator. overnight pulse ox 02/28/09 5:76m pent with sat< 89 sleeping > declined further o2 07/05/09. HFA 75% 04/23/09.   [name] GERD (gastroesophageal reflux disease)   . Hypertension   . Lung nodule   . Prostate enlargement   . Sleep apnea    Stop Bang score of 5. Pt has had sleep study and was told he didn't have Sleep Apnea.  . Weight gain    target weight 196; BMI <30    Past Surgical History:  Procedure Laterality Date  . A FLUTTER ABLATION N/A 02/02/2012   Procedure: ABLATION A FLUTTER;  Surgeon: Deboraha Sprang, MD;  Location: [facility] CATH LAB;  Service: Cardiovascular;  Laterality: N/A;  . BACK SURGERY     x4  . CARDIOVERSION     in past  . CATARACT EXTRACTION W/PHACO Right 09/04/2014   Procedure: CATARACT EXTRACTION PHACO AND INTRAOCULAR LENS PLACEMENT RIGHT EYE CDE=12.15;  Surgeon: Tonny Branch, MD;  Location: [facility] ORS;  Service: Ophthalmology;  Laterality: Right;  . hip replacement surgery     L   . INCISION AND DRAINAGE HIP  09/24/2011   Procedure: IRRIGATION AND DEBRIDEMENT HIP;  Surgeon: Gearlean Alf, MD;  Location: [facility] ORS;  Service: Orthopedics;  Laterality: Left;  . JOINT REPLACEMENT    . KNEE SURGERY Left   . KYPHOSIS SURGERY      ROS:  Positive for back pain, joint pains, fatigue, abdominal discomfort. Otherwise as stated in the HPI and negative for all other systems.  PHYSICAL EXAM BP 140/80   Pulse 89   Ht 5'  7.5" (1.715 m)   Wt 219 lb 3.2 oz (99.4 kg)   SpO2 90%   BMI 33.82 kg/m   GENERAL:  Well appearing NECK:  No jugular venous distention, waveform within normal limits, carotid upstroke brisk and symmetric, no bruits, no thyromegaly LUNGS:  Clear to auscultation bilaterally CHEST:  Unremarkable HEART:  PMI not displaced or sustained,S1 and S2 within normal limits, no S3,  no clicks, no rubs, irregular murmurs ABD:  Flat, positive bowel sounds normal in frequency in pitch, no bruits, no rebound, no guarding, no midline pulsatile mass, no hepatomegaly, no splenomegaly, abdominal hernia EXT:  2 plus pulses throughout, mild left leg edema, no cyanosis no clubbing   EKG:  Atrial fibrillation, rate 81, right bundle branch block, no change from previous.  ASSESSMENT AND PLAN  Chronic Diastolic CHF  He seems to be euvolemic. At this point no change in therapy is indicated.   Atrial Flutter  He is status post ablation.  However, he had recurrent fibrillation.  Mr. Ephraim Reichel has a CHA2DS2 - VASc score of 4.   No change in therapy is planned.  Cardiomyopathy He's had a mildly reduced ejection fraction past.  EF was normal on echo last year although there was evidence of pulmonary HTN  he'll remain on his oxygen. I'm not planning on echo again at this point.  I will consider repeating this in the future.  Hypertension  The blood pressure is at target off of the Norvasc and Cozaar and he can remain off of these as he is going to keep a close follow-up on his blood pressure.   AAA This is small and followed by [facility].

## 2017-01-15 NOTE — Patient Instructions (Signed)

## 2017-01-17 ENCOUNTER — Encounter: Payer: Self-pay | Admitting: Cardiology

## 2017-01-17 DIAGNOSIS — I420 Dilated cardiomyopathy: Secondary | ICD-10-CM | POA: Insufficient documentation

## 2017-01-21 ENCOUNTER — Telehealth: Payer: Self-pay | Admitting: Internal Medicine

## 2017-01-21 NOTE — Telephone Encounter (Signed)
Spoke with patient with MW's recs. Patient has been scheduled for 01/29/17 at 3pm. Patient verbalized understanding. Nothing further needed.

## 2017-01-21 NOTE — Telephone Encounter (Signed)
MW  Please Advise-sick message  This is a former pt that last saw you in 2/14. Front staff made first availble pulmonary consult appt with you for  03/09/17 to re-establish care. Pt states he is fine with that appt but does not know what to do about his symptoms until he comes in. Pt states he uses 2L of oxygen with exertion only but lately he has felt more sob. He states he has been monitoring his oxygen level and he stated he use to be running 90-91% on RA but now he is as low as 84%. He stated he did see Dr. Percival Spanish 01/15/17 his cardiologist and was informed that it appeared to be more of a lung problem than heart problem.  He states he does have some chest tightness, and trouble bending over without having trouble breathing. Denies cough but has been clearing his throat.    Allergies  Allergen Reactions  . Pravastatin Rash    Elevated heart rate per patient   Current Outpatient Prescriptions on File Prior to Visit  Medication Sig Dispense Refill  . budesonide-formoterol (SYMBICORT) 160-4.5 MCG/ACT inhaler Inhale 2 puffs into the lungs 2 (two) times daily.     . Cyanocobalamin (VITAMIN B-12 IJ) Inject as directed every 30 (thirty) days.    [name] HYDROcodone-acetaminophen (NORCO) 10-325 MG tablet Take 1 tablet by mouth every 6 (six) hours as needed.    . metoprolol succinate (TOPROL XL) 25 MG 24 hr tablet Take 1 tablet (25 mg total) by mouth daily. 30 tablet 11  . omeprazole (PRILOSEC) 20 MG capsule Take 20 mg by mouth daily.    . OXYGEN Inhale 2 L into the lungs as needed. Uses 75% of the time & at bedtime.    . Rivaroxaban (XARELTO) 20 MG TABS Take 1 tablet (20 mg total) by mouth daily. 30 tablet 11  . sodium chloride (OCEAN) 0.65 % nasal spray Place 1 spray into the nose as needed. For dryness    . zolpidem (AMBIEN) 10 MG tablet Take 10 mg by mouth at bedtime.      No current facility-administered medications on file prior to visit.

## 2017-01-21 NOTE — Telephone Encounter (Signed)
Stay on 02  2lpm at a minimum and increase to keep above 90% 24/7  Move up appt to first Thursday pm opening (ask tammy davis to open for him)

## 2017-01-29 ENCOUNTER — Ambulatory Visit (INDEPENDENT_AMBULATORY_CARE_PROVIDER_SITE_OTHER): Payer: Medicare Other | Admitting: Internal Medicine

## 2017-01-29 ENCOUNTER — Other Ambulatory Visit (INDEPENDENT_AMBULATORY_CARE_PROVIDER_SITE_OTHER): Payer: Medicare Other

## 2017-01-29 ENCOUNTER — Ambulatory Visit (INDEPENDENT_AMBULATORY_CARE_PROVIDER_SITE_OTHER)
Admission: RE | Admit: 2017-01-29 | Discharge: 2017-01-29 | Disposition: A | Discharge status: Home / Self Care | Payer: Medicare Other | Source: Ambulatory Visit | Attending: Internal Medicine | Admitting: Internal Medicine

## 2017-01-29 ENCOUNTER — Encounter: Payer: Self-pay | Admitting: Internal Medicine

## 2017-01-29 VITALS — BP 134/80 | HR 90 | Ht 67.5 in | Wt 218.0 lb

## 2017-01-29 DIAGNOSIS — J9612 Chronic respiratory failure with hypercapnia: Secondary | ICD-10-CM

## 2017-01-29 DIAGNOSIS — J449 Chronic obstructive pulmonary disease, unspecified: Secondary | ICD-10-CM | POA: Diagnosis not present

## 2017-01-29 DIAGNOSIS — R0609 Other forms of dyspnea: Secondary | ICD-10-CM | POA: Insufficient documentation

## 2017-01-29 DIAGNOSIS — J9611 Chronic respiratory failure with hypoxia: Secondary | ICD-10-CM | POA: Diagnosis not present

## 2017-01-29 LAB — HEPATIC FUNCTION PANEL
ALBUMIN: 4.1 g/dL (ref 3.5–5.2)
ALK PHOS: 45 U/L (ref 39–117)
ALT: 7 U/L (ref 0–53)
AST: 10 U/L (ref 0–37)
Bilirubin, Direct: 0.1 mg/dL (ref 0.0–0.3)
Total Bilirubin: 0.7 mg/dL (ref 0.2–1.2)
Total Protein: 6.8 g/dL (ref 6.0–8.3)

## 2017-01-29 LAB — CBC WITH DIFFERENTIAL/PLATELET
BASOS ABS: 0 10*3/uL (ref 0.0–0.1)
Basophils Relative: 0.6 % (ref 0.0–3.0)
Eosinophils Absolute: 0 10*3/uL (ref 0.0–0.7)
Eosinophils Relative: 0.5 % (ref 0.0–5.0)
HCT: 48.4 % (ref 39.0–52.0)
Hemoglobin: 16 g/dL (ref 13.0–17.0)
LYMPHS ABS: 0.7 10*3/uL (ref 0.7–4.0)
Lymphocytes Relative: 9 % — ABNORMAL LOW (ref 12.0–46.0)
MCHC: 32.9 g/dL (ref 30.0–36.0)
MCV: 91.7 fl (ref 78.0–100.0)
MONOS PCT: 7 % (ref 3.0–12.0)
Monocytes Absolute: 0.5 10*3/uL (ref 0.1–1.0)
NEUTROS ABS: 6 10*3/uL (ref 1.4–7.7)
NEUTROS PCT: 82.9 % — AB (ref 43.0–77.0)
PLATELETS: 159 10*3/uL (ref 150.0–400.0)
RBC: 5.28 Mil/uL (ref 4.22–5.81)
RDW: 13.9 % (ref 11.5–15.5)
WBC: 7.2 10*3/uL (ref 4.0–10.5)

## 2017-01-29 LAB — BASIC METABOLIC PANEL
BUN: 11 mg/dL (ref 6–23)
CO2: 34 mEq/L — ABNORMAL HIGH (ref 19–32)
Calcium: 9.2 mg/dL (ref 8.4–10.5)
Chloride: 101 mEq/L (ref 96–112)
Creatinine, Ser: 0.59 mg/dL (ref 0.40–1.50)
GFR: 143.85 mL/min (ref >60.00)
GLUCOSE: 111 mg/dL — AB (ref 70–99)
POTASSIUM: 4.2 meq/L (ref 3.5–5.1)
Sodium: 142 mEq/L (ref 135–145)

## 2017-01-29 LAB — TSH: TSH: 0.86 u[IU]/mL (ref 0.35–4.50)

## 2017-01-29 LAB — BRAIN NATRIURETIC PEPTIDE: Pro B Natriuretic peptide (BNP): 139 pg/mL — ABNORMAL HIGH (ref 0.0–100.0)

## 2017-01-29 MED ORDER — TIOTROPIUM BROMIDE MONOHYDRATE 2.5 MCG/ACT IN AERS: 2.0000 | INHALATION_SPRAY | Freq: Every day | RESPIRATORY_TRACT | 0 refills | Status: DC

## 2017-01-29 MED ORDER — BUDESONIDE-FORMOTEROL FUMARATE 160-4.5 MCG/ACT IN AERO: 2.0000 | INHALATION_SPRAY | Freq: Two times a day (BID) | RESPIRATORY_TRACT | 0 refills | Status: DC

## 2017-01-29 MED ORDER — TIOTROPIUM BROMIDE MONOHYDRATE 2.5 MCG/ACT IN AERS: 2.0000 | INHALATION_SPRAY | Freq: Every day | RESPIRATORY_TRACT | 11 refills | Status: DC

## 2017-01-29 NOTE — Progress Notes (Signed)
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   September 08, 2008 post hosp fu no problem with doe walked at [facility] 2/11 did ok except legs weak. no cough. rec use spiriva as maint plus as needed albuterol, feels the albuterol really makes a difference.   October 09, 2008 returns for PFTs as requested stating he still feels the need for using albuterol at least two or 3 times a week. However, he is more limited by orthopedic constraints at this point than dyspnea.  rec: stop spiriva - think of it like high octane fuel - it will give you better perfomormance if used regularly each am but you may not need it.  Try Symbicort 160 2 puffs first thing in am and 2 puffs again in pm about 12 hours later and work on perfecting technique  Please schedule a follow-up appointment in 6 weeks, sooner if needed  use 02 at bedtime automatically as needed during the day   November 20, 2008 better, just using symbicort in am and no spiriva at all, no other rx. no change in rx.   Mar 12, 2009 [facility]   April 23, 2009 post op f/u f/u doing outpt rehab  rec  Work on inhaler technique: relax and blow all the way out then take a nice smooth deep breath back in, triggering the inhaler at same time you start breathing in  wear 02 automatically at bedtime for now at 2 lpm      01/29/2017  f/u ov/Hitomi Slape re:  Re-establish care for copd  Chief Complaint  Patient presents with  . Pulmonary Consult    Last seen in 2014. He is coming back in today for eval of increased SOB since March 2018. He states he gets SOB walking approx 10-20 ft.   breathing was not the limiting problem   Until about 3 months prior to OV   - doesn't feel can get deep breath in  [place] at rest 2.5 lpm and sleeping on R side  s 02 76ft  / 50 ft with 02 is easier  2.5 lpm but still that's his limit now   No obvious day to day or daytime variability or assoc excess/ purulent sputum or mucus plugs or hemoptysis or cp or chest tightness, subjective wheeze or overt sinus or hb symptoms. No unusual exp hx or h/o childhood pna/ asthma or knowledge of premature birth.  Sleeping ok without nocturnal  or early am exacerbation  of respiratory  c/o's or need for noct saba. Also denies any obvious fluctuation of symptoms with weather or environmental changes or other aggravating or alleviating factors except as outlined above   Current Medications, Allergies, Complete Past Medical History, Past Surgical History, Family History, and Social History were reviewed in [employer] record.  ROS  The following are not active complaints unless bolded sore throat, dysphagia, dental problems, itching, sneezing,  nasal congestion worse than usual  or excess/ purulent secretions, ear ache,   fever, chills, sweats, unintended wt loss, classically pleuritic or exertional cp,  orthopnea pnd or leg swelling, presyncope, palpitations,  abdominal pain/fullness, anorexia, nausea, vomiting, diarrhea  or change in bowel or bladder habits, change in stools or urine, dysuria,hematuria,  rash, arthralgias, visual complaints, headache, numbness, weakness or ataxia or problems with walking or coordination,  change in mood/affect or memory.               Past Medical History:  ATRIAL FLUTTER (ICD-427.32)  -Echo 09/17/2008 nl ef, mild lae, rv mod dilated, pasystolic 46  -Noct desat 11/01/960, refused 02 @[facility]   COPD............................................................................................... Quayshaun Hubbert  - PFTs 10/09/08 FEV1 1.39 ( 44%)ratio 40, DLCO 63%. 16% improvement after bronchodilator  - Overnight pulse  ox 02/28/2009 5:40m spent with sat < 89 sleeping > declined further 02 July 05, 2009  - HFA 75% April 23, 2009  SPN right mid zone  - first detected 08/16/08  - See CT 08/16/08 [facility] > see repeat CT 02/07/11  Weight Gain  - Target wt = 196 for BMI < 30        Objective:   Physical Exam   Very slow ambulation with cane not limited by sob  wt 214 September 08, 2008 > 228 February 16, 2009 >  > 232 August 20, 2009 > 214 03/11/2011 > 208 04/28/2011 > 222 07/01/2011 > 03/02/2012 221 > 08/31/2012  225 > 01/29/2017     218    Vital signs reviewed    - Note on arrival 02 sats  94% on  2.5 lpm  HEENT: nl  turbinates bilaterally, and oropharynx. Nl external ear canals without cough reflex - missing top dentures   NECK :  without JVD/Nodes/TM/ nl carotid upstrokes bilaterally   LUNGS: no acc muscle use,  Nl contour chest with distant bs and minimal insp /exp rhonchi bilaterally    CV:  RRR  no s3 or murmur or increase in P2, and 2+ pitting on L / 1+ on R lower ext  edema   ABD:  soft and nontender with nl inspiratory excursion in the supine position. No bruits or organomegaly appreciated, bowel sounds nl  MS:  Nl gait/ ext warm without deformities, calf tenderness, cyanosis or clubbing No obvious joint restrictions   SKIN: warm and dry without lesions    NEURO:  alert, approp, nl sensorium with  no motor or cerebellar deficits apparent.       CXR PA and Lateral:   01/29/2017 :    I personally reviewed images and agree with radiology impression as follows:   COPD with chronically increased interstitial markings. No acute pneumonia nor pulmonary edema. Bilateral central pulmonary vascular prominence, stable. This may reflect an element of pulmonary Hypertension   Labs ordered/ reviewed:      Chemistry      Component Value Date/Time   NA 142 01/29/2017 1601   K 4.2 01/29/2017 1601   CL 101 01/29/2017 1601   CO2 34 (H) 01/29/2017 1601   BUN 11 01/29/2017 1601   CREATININE 0.59  01/29/2017 1601      Component Value Date/Time   CALCIUM 9.2 01/29/2017 1601   ALKPHOS 45 01/29/2017 1601   AST 10 01/29/2017 1601   ALT 7 01/29/2017 1601   BILITOT 0.7 01/29/2017 1601        Lab Results  Component Value Date   WBC 7.2 01/29/2017   HGB 16.0 01/29/2017   HCT 48.4 01/29/2017   MCV 91.7 01/29/2017   PLT 159.0 01/29/2017       Lab Results  Component Value Date   TSH 0.86 01/29/2017  Lab Results  Component Value Date   PROBNP 139.0 (H) 01/29/2017          Assessment:

## 2017-01-29 NOTE — Patient Instructions (Addendum)
Plan A = Automatic = Symbicort 160 Take 2 puffs first thing in am and then another 2 puffs about 12 hours later and two puffs of spiriva just with am dose   Please remember to go to the lab and x-ray department downstairs in the basement  for your tests - we will call you with the results when they are available.  Please schedule a follow up office visit in 6 weeks, call sooner if needed pfts plus alpha one/ allergy profile for IgE to be complete

## 2017-01-30 DIAGNOSIS — J9612 Chronic respiratory failure with hypercapnia: Secondary | ICD-10-CM

## 2017-01-30 DIAGNOSIS — J9611 Chronic respiratory failure with hypoxia: Secondary | ICD-10-CM | POA: Insufficient documentation

## 2017-01-30 LAB — RESPIRATORY ALLERGY PROFILE REGION II ~~LOC~~
Allergen, A. alternata, m6: <0.1 kU/L
Allergen, C. Herbarum, M2: <0.1 kU/L
Allergen, D pternoyssinus,d7: <0.1 kU/L
Allergen, Mulberry, t76: <0.1 kU/L
Aspergillus fumigatus, m3: <0.1 kU/L
Box Elder IgE: <0.1 kU/L
Cockroach: <0.1 kU/L
D. farinae: <0.1 kU/L
Dog Dander: <0.1 kU/L
IGE (IMMUNOGLOBULIN E), SERUM: 42 kU/L (ref <115)
Johnson Grass: <0.1 kU/L
Rough Pigweed  IgE: <0.1 kU/L
Sheep Sorrel IgE: <0.1 kU/L
Timothy Grass: <0.1 kU/L

## 2017-01-30 NOTE — Progress Notes (Signed)
Spoke with pt and notified of results per Dr. Wert. Pt verbalized understanding and denied any questions. 

## 2017-01-30 NOTE — Assessment & Plan Note (Addendum)
Body mass index is 33.64 kg/m.  Body mass index is 33.64 kg/m.  -  trending down/ encouraged Lab Results  Component Value Date   TSH 0.86 01/29/2017     Contributing to gerd risk and poor Abd compliance/ doe  - reviewed the need and the process to achieve and maintain neg calorie balance > defer f/u primary care including intermittently monitoring thyroid status

## 2017-01-30 NOTE — Assessment & Plan Note (Addendum)
-   Overnight pulse ox 02/28/2009 5:36m spent with sat < 89 sleeping > declined further 02 July 05, 2009 - [facility]  01/29/2017  = 38 - 01/29/2017   Walked 2 lpm   2 laps @ 185 ft each stopped due to  Sob but no desats  rec 2lpm 24/7

## 2017-01-30 NOTE — Assessment & Plan Note (Addendum)
The differential diagnosis of difficult to control airways disorders is extensive with no quick and easy answers but easy to remember because it consists of 13 A's,  Two Bs and one C: 1. Adherence, always a challenge and the leading suspect.  - rec return with all meds in hand using a trust but verify approach to confirm accurate Medication  Reconciliation The principal here is that until we are certain that the  patients are doing what we've asked, it makes no sense to ask them to do more.  2. Acid reflux disease, with the greater proportion of pulmonary patients with no overt heartburn symptoms, and no easy way to treat non-acid reflux rec continue max rx for gerd 3. Ace inhibitor use  N/a here  4. Active sinus dz, best addressed by a sinus ct >nothing to suggest  5. Active smoking,  Usually sureptitious in this setting> denies  6. Allergic diseases, usually with a hx dating back to childhood with prominent allergic rhinitis features in up 90% of pts> n/a but may have element of asthma addressed with symbicort 160  7. Aspiration, a perennial problem in the elderly or other patients at risk 8. 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 Alpha one Antitrypsin deficiency, a must screen in patients with chronic airflow obstruction syndromes out of proportion to smoking history > needs level to be complete when returns for pfts  10. Adverse effect of inhalers, especially DPI's and especially with poor inhaler technique> use hfa/ smi 11 Anxiety/ depression / deconditioning, always a diagnosis of exclusion> higher on list in his case > Follow up per Primary Care planned   12. A bunch of PE's ie moderately large clot burden, a few small ones peripherally can cause pleuritic cp syndromes but not unexplained dyspnea  > pt on xarelto so unlikely assuming he takes it 13 Anemia or Thyroid disorders,  Ruled out  Two B's 1. Bronchiectasis:  Pos CT is the  sine que non here 2  Beta blocker effects:  Coreg and Timolol use are pervasive in the adult population and both have significant spillover effects on the airways One C 1. Congestive heart failure, esp diastolic dysfunction and R ht failure  - last ech 02/2016 : Left ventricle: The cavity size was normal. Wall thickness was   increased increased in a pattern of mild to moderate LVH.   Systolic function was normal. The estimated ejection fraction was   in the range of 55% to 60%. The study is not technically   sufficient to allow evaluation of LV diastolic function. - Aortic valve: Mildly calcified annulus. Trileaflet; moderately   thickened leaflets. Valve area (VTI): 1.64 cm^2. Valve area   (Vmax): 1.54 cm^2. Valve area (Vmean): 1.55 cm^2. - Right ventricle: There is flattening of the ventricular septum   with systole consistent with elevated RV pressure. The cavity   size was moderately to severely dilated. The RV shares the apex   with the LV. Systolic function was normal. - Right atrium: The atrium was moderately to severely dilated. - Atrial septum: No defect or patent foramen ovale was identified. - Pulmonary arteries: Systolic pressure could not be accurately   estimated. Inadequate TR jet. RV enlargement and systolic   ventricular septum flattening suggests pulmonary hypertension is   present  > f/u cards planned - main rx is control bp/ optimize 02 / diuresis   Total time devoted to counseling  > 50 % of initial 60  min office visit:  review case with pt/ discussion of options/alternatives/ personally creating written customized instructions  in presence of pt  then going over those specific  Instructions directly with the pt including how to use all of the meds but in particular covering each new medication in detail and the difference between the maintenance= "automatic" meds and the prns using an action plan format for the latter (If this problem/symptom => do that organization  reading Left to right).  Please see AVS from this visit for a full list of these instructions which I personally wrote for this pt and  are unique to this visit.

## 2017-01-30 NOTE — Progress Notes (Signed)
Pt already made aware of results

## 2017-03-09 ENCOUNTER — Institutional Professional Consult (permissible substitution): Payer: Medicare Other | Admitting: Internal Medicine

## 2017-03-10 ENCOUNTER — Telehealth: Payer: Self-pay | Admitting: *Deleted

## 2017-03-10 NOTE — Telephone Encounter (Signed)
Requesting surgical clearance:   1. Type of surgery: Cervical ESI  2. Surgeon: UnKnown  3. Surgical date: 04/07/2017  4. Medications that need to be held: Xarelto  5. [place]: [phone]   Is pt cleared for surgery and how long can Xarelto be held?

## 2017-03-12 NOTE — Telephone Encounter (Signed)
Clearance faxed to [facility] via [employer]

## 2017-03-12 NOTE — Telephone Encounter (Signed)
He is OK to be off of Xarelto for 5 days prior to surgery.  He does not have high risk surgical features and this is not a high risk procedure.   Therefore, based on ACC/AHA guidelines, the patient would be at acceptable risk for the planned procedure without further cardiovascular testing.

## 2017-03-13 ENCOUNTER — Encounter: Payer: Self-pay | Admitting: Internal Medicine

## 2017-03-13 ENCOUNTER — Ambulatory Visit (INDEPENDENT_AMBULATORY_CARE_PROVIDER_SITE_OTHER): Payer: Medicare Other | Admitting: Internal Medicine

## 2017-03-13 ENCOUNTER — Telehealth: Payer: Self-pay | Admitting: Internal Medicine

## 2017-03-13 VITALS — BP 116/70 | HR 91 | Ht 67.5 in | Wt 218.0 lb

## 2017-03-13 DIAGNOSIS — J9612 Chronic respiratory failure with hypercapnia: Secondary | ICD-10-CM

## 2017-03-13 DIAGNOSIS — J449 Chronic obstructive pulmonary disease, unspecified: Secondary | ICD-10-CM

## 2017-03-13 DIAGNOSIS — J9611 Chronic respiratory failure with hypoxia: Secondary | ICD-10-CM

## 2017-03-13 LAB — PULMONARY FUNCTION TEST
DL/VA % pred: 53 %
DL/VA: 2.38 ml/min/mmHg/L
DLCO cor % pred: 42 %
DLCO cor: 12.2 ml/min/mmHg
DLCO unc % pred: 43 %
DLCO unc: 12.68 ml/min/mmHg
FEF 25-75 POST: 0.51 L/s
FEF 25-75 Pre: 0.41 L/sec
FEF2575-%Change-Post: 26 %
FEF2575-%PRED-POST: 23 %
FEF2575-%Pred-Pre: 18 %
FEV1-%CHANGE-POST: 7 %
FEV1-%PRED-POST: 37 %
FEV1-%PRED-PRE: 35 %
FEV1-POST: 1.09 L
FEV1-PRE: 1.01 L
FEV1FVC-%Change-Post: 0 %
FEV1FVC-%PRED-PRE: 58 %
FEV6-%Change-Post: 5 %
FEV6-%Pred-Post: 62 %
FEV6-%Pred-Pre: 59 %
FEV6-POST: 2.33 L
FEV6-Pre: 2.22 L
FEV6FVC-%Change-Post: -1 %
FEV6FVC-%Pred-Post: 98 %
FEV6FVC-%Pred-Pre: 100 %
FVC-%CHANGE-POST: 7 %
FVC-%PRED-POST: 64 %
FVC-%PRED-PRE: 59 %
FVC-POST: 2.54 L
FVC-PRE: 2.36 L
POST FEV1/FVC RATIO: 43 %
PRE FEV6/FVC RATIO: 94 %
Post FEV6/FVC ratio: 92 %
Pre FEV1/FVC ratio: 43 %

## 2017-03-13 MED ORDER — GLYCOPYRROLATE-FORMOTEROL 9-4.8 MCG/ACT IN AERO: 2.0000 | INHALATION_SPRAY | Freq: Two times a day (BID) | RESPIRATORY_TRACT | 11 refills | Status: DC

## 2017-03-13 MED ORDER — ALBUTEROL SULFATE HFA 108 (90 BASE) MCG/ACT IN AERS: INHALATION_SPRAY | RESPIRATORY_TRACT | 11 refills | Status: DC

## 2017-03-13 MED ORDER — GLYCOPYRROLATE-FORMOTEROL 9-4.8 MCG/ACT IN AERO: 2.0000 | INHALATION_SPRAY | Freq: Two times a day (BID) | RESPIRATORY_TRACT | 0 refills | Status: DC

## 2017-03-13 NOTE — Telephone Encounter (Signed)
Pt had additional concerns after office visit- wanted to know if he could use up the rest of his Symbicort before switching to the Bevespi samples given at today's OV.   Also, pt wants to know if we can order a POC for exertional O2.    MW please advise.  Thanks

## 2017-03-13 NOTE — Telephone Encounter (Signed)
It would make more sense to me to change to the bevespi samples now and unless impressed they help then switch back to the symibicort rather than fill the bevespi rx we gave him   Ok to refer for  ambulatory 02 titration to see if eligible for POC

## 2017-03-13 NOTE — Patient Instructions (Addendum)
Plan A = Automatic = Bevespi  Take 2 puffs first thing in am and then another 2 puffs about 12 hours later.   Work on inhaler technique:  relax and gently blow all the way out then take a nice smooth deep breath back in, triggering the inhaler at same time you start breathing in.  Hold for up to 5 seconds if you can. Blow out thru nose. Rinse and gargle with water when done   Plan B = Backup Only use your albuterol (proair) as a rescue medication to be used if you can't catch your breath by resting or doing a relaxed purse lip breathing pattern.  - The less you use it, the better it will work when you need it. - Ok to use the inhaler up to 2 puffs  every 4 hours if you must but call for appointment if use goes up over your usual need - Don't leave home without it !!  (think of it like the spare tire for your car)    Please schedule a follow up visit in 3 months but call sooner if needed

## 2017-03-13 NOTE — Progress Notes (Signed)
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   September 08, 2008 post hosp fu no problem with doe walked [facility] 2/11 did ok except legs weak. no cough. rec use spiriva as maint plus as needed albuterol, feels the albuterol really makes a difference.   October 09, 2008 returns for PFTs as requested stating he still feels the need for using albuterol at least two or 3 times a week. However, he is more limited by orthopedic constraints at this point than dyspnea.  rec: stop spiriva - think of it like high octane fuel - it will give you better perfomormance if used regularly each am but you may not need it.  Try Symbicort 160 2 puffs first thing in am and 2 puffs again in pm about 12 hours later and work on perfecting technique  Please schedule a follow-up appointment in 6 weeks, sooner if needed  use 02 at bedtime automatically as needed during the day   November 20, 2008 better, just using symbicort in am and no spiriva at all, no other rx. no change in rx.   Mar 12, 2009 [facility]   April 23, 2009 post op f/u f/u doing outpt rehab  rec  Work on inhaler technique: relax and blow all the way out then take a nice smooth deep breath back in, triggering the inhaler at same time you start breathing in  wear 02 automatically at bedtime for now at 2 lpm      01/29/2017  f/u ov/Yetta Marceaux re:  Re-establish care for copd  Chief Complaint  Patient presents with  . Pulmonary Consult    Last seen in 2014. He is coming back in today for eval of increased SOB since March 2018. He states he gets SOB walking approx 10-20 ft.   breathing was not the limiting  problem  Until about 3 months prior to OV   - doesn't feel can get deep breath in  [place] at rest 2.5 lpm and sleeping on R side  s 02 23ft  / 50 ft with 02 is easier  2.5 lpm but still that's his limit now  rec Plan A = Automatic = Symbicort 160 Take 2 puffs first thing in am and then another 2 puffs about 12 hours later and two puffs of spiriva just with am dose  Please remember to go to the lab and x-ray department downstairs in the basement  for your tests - we will call you with the results when they are available. Please schedule a follow up office visit in 6 weeks, call sooner if needed pfts plus alpha one/ allergy profile for IgE to be complete      03/13/2017  f/u ov/Miyah Hampshire re: COPD GOLD III  Chief Complaint  Patient presents with  . Follow-up    review pft.  pt c/o stable DOE. does not ambulate much d/t back and hip problems.    sob room to room off 02,  Not as bad on 02 2.5 lpm  Did not think the spiriva helped but not sure as did not take  it correctly Sleeps s resp disturbance   on 3lpm  On right side down    No obvious day to day or daytime variability or assoc excess/ purulent sputum or mucus plugs or hemoptysis or cp or chest tightness, subjective wheeze or overt sinus or hb symptoms. No unusual exp hx or h/o childhood pna/ asthma or knowledge of premature birth.  Sleeping ok without nocturnal  or early am exacerbation  of respiratory  c/o's or need for noct saba. Also denies any obvious fluctuation of symptoms with weather or environmental changes or other aggravating or alleviating factors except as outlined above   Current Medications, Allergies, Complete Past Medical History, Past Surgical History, Family History, and Social History were reviewed in [employer] record.  ROS  The following are not active complaints unless bolded sore throat, dysphagia, dental problems, itching, sneezing,  nasal congestion or excess/ purulent secretions, ear ache,   fever,  chills, sweats, unintended wt loss, classically pleuritic or exertional cp,  orthopnea pnd or leg swelling, presyncope, palpitations, abdominal pain, anorexia, nausea, vomiting, diarrhea  or change in bowel or bladder habits, change in stools or urine, dysuria,hematuria,  rash, arthralgias, visual complaints, headache, numbness, weakness or ataxia or problems with walking or coordination,  change in mood/affect or memory.                      Past Medical History:  ATRIAL FLUTTER (ICD-427.32)  -Echo 09/17/2008 nl ef, mild lae, rv mod dilated, pasystolic 46  -Noct desat 2/0/2542, refused 02 @[facility]   COPD............................................................................................... Isatou Agredano  - PFTs 10/09/08 FEV1 1.39 ( 44%)ratio 40, DLCO 63%. 16% improvement after bronchodilator  - Overnight pulse ox 02/28/2009 5:81m spent with sat < 89 sleeping > declined further 02 July 05, 2009  - HFA 75% April 23, 2009   Weight Gain  - Target wt = 196 for BMI < 30        Objective:   Physical Exam   Very slow ambulation with cane not limited by sob  wt 214 September 08, 2008 > 228 February 16, 2009 >  > 232 August 20, 2009 > 214 03/11/2011 > 208 04/28/2011 > 222 07/01/2011 > 03/02/2012 221 > 08/31/2012  225 > 01/29/2017     218  >  03/13/2017   218   Vital signs reviewed    -  Note on arrival 02 sats   9% on 2.5 lpm e tank  HEENT: nl  turbinates bilaterally, and oropharynx. Nl external ear canals without cough reflex - missing top dentures   NECK :  without JVD/Nodes/TM/ nl carotid upstrokes bilaterally   LUNGS: no acc muscle use,  Nl contour chest with distant bs and minimal insp /exp rhonchi bilaterally    CV:  RRR  no s3 or murmur or increase in P2, and 2+ pitting on L / 1+ on R lower ext  edema with elastic hose in place   ABD:  soft and nontender with nl inspiratory excursion in the supine position. No bruits or organomegaly appreciated, bowel sounds nl  MS:  Nl gait/ ext warm  without deformities, calf tenderness, cyanosis or clubbing No obvious joint restrictions   SKIN: warm and dry without lesions    NEURO:  alert, approp, nl sensorium with  no motor or cerebellar deficits apparent.       CXR PA and Lateral:   01/29/2017 :    I personally reviewed images and agree with radiology impression as follows:  COPD with chronically increased interstitial markings. No acute pneumonia nor pulmonary edema. Bilateral central pulmonary vascular prominence, stable. This may reflect an element of pulmonary Hypertension   Labs   reviewed:      Chemistry      Component Value Date/Time   NA 142 01/29/2017 1601   K 4.2 01/29/2017 1601   CL 101 01/29/2017 1601   CO2 34 (H) 01/29/2017 1601   BUN 11 01/29/2017 1601   CREATININE 0.59 01/29/2017 1601      Component Value Date/Time   CALCIUM 9.2 01/29/2017 1601   ALKPHOS 45 01/29/2017 1601   AST 10 01/29/2017 1601   ALT 7 01/29/2017 1601   BILITOT 0.7 01/29/2017 1601        Lab Results  Component Value Date   WBC 7.2 01/29/2017   HGB 16.0 01/29/2017   HCT 48.4 01/29/2017   MCV 91.7 01/29/2017   PLT 159.0 01/29/2017       Lab Results  Component Value Date   TSH 0.86 01/29/2017     Lab Results  Component Value Date   PROBNP 139.0 (H) 01/29/2017          Assessment:

## 2017-03-13 NOTE — Telephone Encounter (Signed)
Called and spoke to pt. Informed him of the recs per MW. Order placed. Pt verbalized understanding and denied any further questions or concerns at this time.

## 2017-03-13 NOTE — Progress Notes (Signed)
PFT done today. 

## 2017-03-15 NOTE — Assessment & Plan Note (Addendum)
Body mass index is 33.64 kg/m.  -  Trending no change  Lab Results  Component Value Date   TSH 0.86 45/80/9983    Complicated by hbp and perhaps mild OHS  Contributing to gerd risk/ doe/reviewed the need and the process to achieve and maintain neg calorie balance > defer f/u primary care including intermittently monitoring thyroid status

## 2017-03-15 NOTE — Assessment & Plan Note (Signed)
-   PFTs 10/09/08 FEV1 1.39 ( 44%)ratio 40, DLCO 63%. 16% improvement after bronchodilator  - PFT's  03/13/2017  FEV1 1.09 (37 % ) ratio 43  p 7 % improvement from saba p symb 160 x 2  prior to study with DLCO  43/42 % corrects to 53  % for alv volume   -  01/29/2017     Try adding spiriva respimat to symb 160 > did not use correctly   - 03/13/2017  After extensive coaching HFA effectiveness =   90% >  bevespi 2bid    Pt is Group B in terms of symptom/risk and laba/lama therefore appropriate rx at this point and has deteriorate since last study but still technically GOLD III/ 02 dep related to obesity as well as copd   Trial of bevespi approp  I had an extended discussion with the patient reviewing all relevant studies completed to date and  lasting 15 to 20 minutes of a 25 minute visit    Formulary restrictions will be an ongoing challenge for the forseable future and I would be happy to pick an alternative if the pt will first  provide me a list of them but pt  will need to return here for training for any new device that is required eg dpi vs hfa vs respimat.    In meantime we can always provide samples so the patient never runs out of any needed respiratory medications.   Each maintenance medication was reviewed in detail including most importantly the difference between maintenance and prns and under what circumstances the prns are to be triggered using an action plan format that is not reflected in the computer generated alphabetically organized AVS.    Please see AVS for specific instructions unique to this visit that I personally wrote and verbalized to the the pt in detail and then reviewed with pt  by my nurse highlighting any  changes in therapy recommended at today's visit to their plan of care.

## 2017-03-15 NOTE — Assessment & Plan Note (Signed)
-   Overnight pulse ox 02/28/2009 5:24m spent with sat < 89 sleeping > declined further 02 July 05, 2009 - [facility]  01/29/2017  = 34  - 01/29/2017   Walked 2 lpm   2 laps @ 185 ft each stopped due to  Sob but no desats  - 03/13/2017 rec ambulatory 02 titration to see if eligible for POC

## 2017-06-03 ENCOUNTER — Other Ambulatory Visit: Payer: Self-pay | Admitting: *Deleted

## 2017-06-03 MED ORDER — METOPROLOL SUCCINATE ER 25 MG PO TB24: 25.0000 mg | ORAL_TABLET | Freq: Every day | ORAL | 1 refills | Status: DC

## 2017-06-15 ENCOUNTER — Ambulatory Visit: Payer: Medicare Other | Admitting: Internal Medicine

## 2017-08-03 LAB — HM HEPATITIS C SCREENING LAB: HM Hepatitis Screen: NEGATIVE

## 2017-08-10 NOTE — Progress Notes (Signed)
HPI The patient presents for followup of atrial flutter and diastolic heart failure.   I last saw him last year.  He had atrial flutter with ablation but went back into atrial flutter.  He has been managed with rate control and anticoagulation.  He had an echo last 2017 with normal LV function and some evidence of elevated pulmonary pressure.   He wears O2 at night and PRN.  He is always fairly miserable when I see him as he has significant back pain and many somatic complaints.  He feels his heart racing at times and sometimes in the 50s.  He says it seems to happen that he gets a cold sweat and a rapid heart rate around 6:55 PM every day.  He also said that recently when he was off of Xarelto for an injection his back felt better.  Looking this up in fact back pain is associated with Xarelto 3% of the time.  He gets around very slowly.  He is not having any new chest pain, neck or arm discomfort.  He has chronic abdominal discomfort secondary to hernia.   Allergies  Allergen Reactions  . Pravastatin Rash    Elevated heart rate per patient    Current Outpatient Medications  Medication Sig Dispense Refill  . albuterol (PROAIR HFA) 108 (90 Base) MCG/ACT inhaler 2 puffs every 4 hours as needed only  if your can't catch your breath 1 Inhaler 11  . amLODipine (NORVASC) 10 MG tablet Take 10 mg by mouth daily.    . Cyanocobalamin (VITAMIN B-12 IJ) Inject as directed every 30 (thirty) days.    . Glycopyrrolate-Formoterol (BEVESPI AEROSPHERE) 9-4.8 MCG/ACT AERO Inhale 2 puffs into the lungs 2 (two) times daily. 1 Inhaler 11  . HYDROcodone-acetaminophen (NORCO) 10-325 MG tablet Take 1 tablet by mouth every 6 (six) hours as needed.    [name] losartan (COZAAR) 50 MG tablet Take 50 mg by mouth daily.    . metoprolol succinate (TOPROL XL) 25 MG 24 hr tablet Take 1 tablet (25 mg total) daily by mouth. 30 tablet 1  . omeprazole (PRILOSEC) 20 MG capsule Take 20 mg by mouth daily.    . OXYGEN 3lpm with sleep and  2.5lpm with exertion AHC    . sodium chloride (OCEAN) 0.65 % nasal spray Place 1 spray into the nose as needed. For dryness    . zolpidem (AMBIEN) 10 MG tablet Take 10 mg by mouth at bedtime.     [name] apixaban (ELIQUIS) 5 MG TABS tablet Take 1 tablet (5 mg total) by mouth 2 (two) times daily. 60 tablet 11   No current facility-administered medications for this visit.     Past Medical History:  Diagnosis Date  . Abnormal CT scan    SPN right mid zone. first detected 08/16/08; see [facility]   . Atrial flutter ([place])    a. echo 09/28/11 EF 50-55%, mild RAE, RV mildly dilated, systolic fxn mild-mod reduced;  b. 01/2012 s/p EPS & RFCA.  [name] Chronic diastolic CHF (congestive heart failure) ([place])   . Claustrophobia   . COPD (chronic obstructive pulmonary disease) ([place])    [place]. PFTs 10/09/08 FEV1 1.39 (44%) ratio 40, DLCO 63%. 16% improvement after bronchodilator. overnight pulse ox 02/28/09 5:74m pent with sat< 89 sleeping > declined further o2 07/05/09. HFA 75% 04/23/09.   [name] GERD (gastroesophageal reflux disease)   . Hypertension   . Lung nodule   . Prostate enlargement   . Sleep apnea  Stop Bang score of 5. Pt has had sleep study and was told he didn't have Sleep Apnea.  . Weight gain    target weight 196; BMI <30    Past Surgical History:  Procedure Laterality Date  . A FLUTTER ABLATION N/A 02/02/2012   Procedure: ABLATION A FLUTTER;  Surgeon: Deboraha Sprang, MD;  Location: [facility] CATH LAB;  Service: Cardiovascular;  Laterality: N/A;  . BACK SURGERY     x4  . CARDIOVERSION     in past  . CATARACT EXTRACTION W/PHACO Right 09/04/2014   Procedure: CATARACT EXTRACTION PHACO AND INTRAOCULAR LENS PLACEMENT RIGHT EYE CDE=12.15;  Surgeon: Tonny Branch, MD;  Location: [facility] ORS;  Service: Ophthalmology;  Laterality: Right;  . hip replacement surgery     L   . INCISION AND DRAINAGE HIP  09/24/2011   Procedure: IRRIGATION AND DEBRIDEMENT HIP;  Surgeon: Gearlean Alf, MD;  Location: [facility] ORS;  Service: Orthopedics;   Laterality: Left;  . JOINT REPLACEMENT    . KNEE SURGERY Left   . KYPHOSIS SURGERY      ROS: Positive for tiredness, abdominal pain, joint pain, fatigue, shortness of breath, palpitations, cold sweats.  Otherwise as stated in the HPI and negative for all other systems.  PHYSICAL EXAM BP (!) 151/83   Pulse 76   Ht 5' 7.5" (1.715 m)   Wt 211 lb 3.2 oz (95.8 kg)   SpO2 92%   BMI 32.59 kg/m   GENERAL:  Chronically ill appearing NECK:  No jugular venous distention, waveform within normal limits, carotid upstroke brisk and symmetric, no bruits, no thyromegaly LUNGS:  Clear to auscultation bilaterally CHEST:  Unremarkable HEART:  PMI not displaced or sustained,S1 and S2 within normal limits, no S3, no clicks, no rubs, no murmurs ABD:  Flat, positive bowel sounds normal in frequency in pitch, no bruits, no rebound, no guarding, no midline pulsatile mass, no hepatomegaly, no splenomegaly EXT:  2 plus pulses throughout, no edema, no cyanosis no clubbing    ASSESSMENT AND PLAN  Chronic Diastolic CHF  He seems to be euvolemic.  No change in therapy.   Atrial Flutter  He is status post ablation.  However, he had recurrent fibrillation.  [name] has a CHA2DS2 - VASc score of 4.   He complains of back pain that was better off of Xarelto.  This could contribute.  Therefore, I will switch him to Eliquis.  I am also going to check a 48-hour Holter as he complains of rapid heart rates and some bradycardia arrhythmias as well.   Cardiomyopathy He had a mildly reduced ejection fraction in the past.  He is not volume overloaded as above and I will make no changes to his meds otherwise.   Hypertension  The blood pressure is at target.  No change in therapy is indicated.   AAA This was followed by [facility] in March.

## 2017-08-12 ENCOUNTER — Ambulatory Visit (INDEPENDENT_AMBULATORY_CARE_PROVIDER_SITE_OTHER): Payer: Medicare Other | Admitting: Cardiology

## 2017-08-12 ENCOUNTER — Encounter: Payer: Self-pay | Admitting: Cardiology

## 2017-08-12 VITALS — BP 151/83 | HR 76 | Ht 67.5 in | Wt 211.2 lb

## 2017-08-12 DIAGNOSIS — I10 Essential (primary) hypertension: Secondary | ICD-10-CM

## 2017-08-12 DIAGNOSIS — I5032 Chronic diastolic (congestive) heart failure: Secondary | ICD-10-CM

## 2017-08-12 DIAGNOSIS — I4821 Permanent atrial fibrillation: Secondary | ICD-10-CM

## 2017-08-12 DIAGNOSIS — I482 Chronic atrial fibrillation: Secondary | ICD-10-CM | POA: Diagnosis not present

## 2017-08-12 MED ORDER — APIXABAN 5 MG PO TABS: 5.0000 mg | ORAL_TABLET | Freq: Two times a day (BID) | ORAL | 11 refills | Status: DC

## 2017-08-12 NOTE — Patient Instructions (Addendum)
Medication Instructions:  Please stop Xarelto and start Eliquis. Continue all other medications as listed.  Testing/Procedures: Your physician has recommended that you wear a holter monitor for 48 hours. This will be placed at the [facility] office.  Holter monitors are medical devices that record the heart's electrical activity. Doctors most often use these monitors to diagnose arrhythmias. Arrhythmias are problems with the speed or rhythm of the heartbeat. The monitor is a small, portable device. You can wear one while you do your normal daily activities. This is usually used to diagnose what is causing palpitations/syncope (passing out).  Follow-Up: Follow up in 1 year with Dr. Percival Spanish.  You will receive a letter in the mail 2 months before you are due.  Please call us when you receive this letter to schedule your follow up appointment.  If you need a refill on your cardiac medications before your next appointment, please call your pharmacy.  Thank you for choosing [facility]!!

## 2017-09-02 ENCOUNTER — Other Ambulatory Visit: Payer: Self-pay | Admitting: Cardiology

## 2017-09-04 ENCOUNTER — Other Ambulatory Visit: Payer: Self-pay | Admitting: *Deleted

## 2017-09-04 DIAGNOSIS — I4892 Unspecified atrial flutter: Secondary | ICD-10-CM

## 2018-02-01 DIAGNOSIS — E538 Deficiency of other specified B group vitamins: Secondary | ICD-10-CM

## 2018-02-01 HISTORY — DX: Deficiency of other specified B group vitamins: E53.8

## 2018-07-22 ENCOUNTER — Other Ambulatory Visit: Payer: Self-pay | Admitting: Cardiology

## 2018-08-20 ENCOUNTER — Other Ambulatory Visit: Payer: Self-pay | Admitting: Cardiology

## 2018-10-19 ENCOUNTER — Other Ambulatory Visit: Payer: Self-pay | Admitting: Cardiology

## 2018-10-19 ENCOUNTER — Encounter: Payer: Self-pay | Admitting: *Deleted

## 2018-10-19 NOTE — Telephone Encounter (Signed)
Care everywhere Scr = 0.72 on 01/2018 Dr Percival Spanish f/u visit  08/12/2017

## 2018-10-21 ENCOUNTER — Other Ambulatory Visit: Payer: Self-pay | Admitting: Cardiology

## 2018-10-26 ENCOUNTER — Telehealth: Payer: Self-pay | Admitting: Cardiology

## 2018-10-26 NOTE — Telephone Encounter (Signed)
° ° ° °  COVID-19 Pre-Screening Questions:   Do you currently have a fever? No    Have you recently travelled on a cruise, internationally, or to [place], [place], [place], [place], [place], or [place], [place] [employer]) ? No    Have you been in contact with someone that is currently pending confirmation of Covid19 testing or has been confirmed to have the [place] virus?  No   Are you currently experiencing fatigue or cough? No, just seasonal alergies

## 2018-10-27 ENCOUNTER — Telehealth: Payer: Medicare Other | Admitting: Cardiology

## 2018-10-27 ENCOUNTER — Encounter: Payer: Self-pay | Admitting: Cardiology

## 2018-10-27 NOTE — Progress Notes (Signed)
Error

## 2019-01-17 ENCOUNTER — Other Ambulatory Visit: Payer: Self-pay | Admitting: Cardiology

## 2019-01-17 NOTE — Telephone Encounter (Signed)
Pt is a 73 yr old male who had a Telemed appt with Dr. Percival Spanish on 10/27/18. He had a OV with [facility] via care everywhere and weight was 91.6Kg on 01/06/19 and SCr that same date was 0.57. Will refill Eliquis 5mg  BID.

## 2019-03-23 ENCOUNTER — Ambulatory Visit: Payer: Medicare Other | Admitting: Cardiology

## 2019-04-13 ENCOUNTER — Other Ambulatory Visit: Payer: Self-pay

## 2019-04-13 ENCOUNTER — Telehealth: Payer: Self-pay | Admitting: Family Medicine

## 2019-04-13 NOTE — Telephone Encounter (Signed)
I would explain to him that we are not seeing sick patient's in the office and we are taking appropriate precautions to keep everyone healthy. If he still does not want to come in, I can do a telephone visit but he will still need to come in to have an appointment at some point.

## 2019-04-13 NOTE — Telephone Encounter (Signed)
Patient aware and will come in to be seen

## 2019-04-14 ENCOUNTER — Encounter: Payer: Self-pay | Admitting: Family Medicine

## 2019-04-14 ENCOUNTER — Other Ambulatory Visit: Payer: Self-pay | Admitting: Cardiology

## 2019-04-14 ENCOUNTER — Ambulatory Visit (INDEPENDENT_AMBULATORY_CARE_PROVIDER_SITE_OTHER): Payer: Medicare Other | Admitting: Family Medicine

## 2019-04-14 VITALS — BP 140/73 | HR 106 | Temp 98.6°F | Ht 67.0 in | Wt 201.0 lb

## 2019-04-14 DIAGNOSIS — I4892 Unspecified atrial flutter: Secondary | ICD-10-CM

## 2019-04-14 DIAGNOSIS — E785 Hyperlipidemia, unspecified: Secondary | ICD-10-CM

## 2019-04-14 DIAGNOSIS — M545 Low back pain, unspecified: Secondary | ICD-10-CM

## 2019-04-14 DIAGNOSIS — G479 Sleep disorder, unspecified: Secondary | ICD-10-CM | POA: Diagnosis not present

## 2019-04-14 DIAGNOSIS — I10 Essential (primary) hypertension: Secondary | ICD-10-CM

## 2019-04-14 DIAGNOSIS — G8929 Other chronic pain: Secondary | ICD-10-CM

## 2019-04-14 DIAGNOSIS — J9612 Chronic respiratory failure with hypercapnia: Secondary | ICD-10-CM

## 2019-04-14 DIAGNOSIS — J9611 Chronic respiratory failure with hypoxia: Secondary | ICD-10-CM

## 2019-04-14 DIAGNOSIS — E538 Deficiency of other specified B group vitamins: Secondary | ICD-10-CM

## 2019-04-14 DIAGNOSIS — Z7901 Long term (current) use of anticoagulants: Secondary | ICD-10-CM

## 2019-04-14 DIAGNOSIS — Z23 Encounter for immunization: Secondary | ICD-10-CM | POA: Diagnosis not present

## 2019-04-14 DIAGNOSIS — J449 Chronic obstructive pulmonary disease, unspecified: Secondary | ICD-10-CM

## 2019-04-14 DIAGNOSIS — I5032 Chronic diastolic (congestive) heart failure: Secondary | ICD-10-CM

## 2019-04-14 DIAGNOSIS — K219 Gastro-esophageal reflux disease without esophagitis: Secondary | ICD-10-CM

## 2019-04-14 MED ORDER — BELSOMRA 10 MG PO TABS: 10.0000 mg | ORAL_TABLET | Freq: Every day | ORAL | 2 refills | Status: DC

## 2019-04-14 MED ORDER — CYANOCOBALAMIN 1000 MCG/ML IJ SOLN
1000.0000 ug | INTRAMUSCULAR | Status: AC
  Administered 2019-04-14 – 2020-05-14 (×6): 1000 ug via INTRAMUSCULAR

## 2019-04-14 MED ORDER — OMEPRAZOLE 20 MG PO CPDR: 20.0000 mg | DELAYED_RELEASE_CAPSULE | Freq: Every day | ORAL | 1 refills | Status: DC

## 2019-04-14 MED ORDER — ZOLPIDEM TARTRATE 5 MG PO TABS: ORAL_TABLET | ORAL | 0 refills | Status: DC

## 2019-04-14 NOTE — Progress Notes (Signed)
New Patient Office Visit  Assessment & Plan:  1. Difficulty sleeping - Patient is going to switch over to [place] as a safer alternative. Discussed 10 mg of Ambien is too much over the age of 72.  - zolpidem (AMBIEN) 5 MG tablet; Take 1 tablet (5 mg) by mouth nightly x3 weeks, then alternate with belsomra every other night x3 weeks, then stop.  Dispense: 32 tablet; Refill: 0 - Suvorexant (BELSOMRA) 10 MG TABS; Take 10 mg by mouth at bedtime. (before you take nightly, alternate with Ambien every other night x3 weeks)  Dispense: 30 tablet; Refill: 2  2. Gastroesophageal reflux disease without esophagitis - Well controlled on current regimen.  - omeprazole (PRILOSEC) 20 MG capsule; Take 1 capsule (20 mg total) by mouth daily.  Dispense: 90 capsule; Refill: 1  3. Vitamin B 12 deficiency - Well controlled on current regimen. Injection given today.  - cyanocobalamin ((VITAMIN B-12)) injection 1,000 mcg  4. Morbid obesity due to excess calories ([place]) - Patient unable to exercise due to pain. Dietary changes encouraged.   5. Need for immunization against influenza - Flu Vaccine QUAD 36+ mos IM  6-10. Atrial flutter, unspecified type (HCC)/Chronic diastolic heart failure (HCC)/Anticoagulant long-term use/Essential hypertension/Dyslipidemia - Managed by cardiologist. Next appointment 04/20/2019.   11-12. Chronic respiratory failure with hypoxia and hypercapnia (HCC)/COPD GOLD III with reversible component/ - Managed by Dr. Melvyn Novas. Patient does wear continuous oxygen.  13. Chronic midline low back pain, unspecified whether sciatica present - Managed by Dr. Nelva Bush. This is not well controlled but when I mentioned reaching out to Dr. Nelva Bush to discuss he got very upset. He states it is hard enough to get to [place] to appointments as he lives alone and if I tell him that it isn't working well he will start changing his medication and make him come out all the time. He is afraid he will not ever  find anything to treat his pain well so it will be a lot of wasted trips. We also discussed his pain at night but patient does not wish to change anything.    Follow-up: Return in about 6 weeks (around 05/26/2019) for sleep (virtual visit).   Hendricks Limes, MSN, APRN, FNP-C [facility]  Subjective:  Patient ID: Francisco Tanner, male    DOB: Feb 04, 1946  Age: 73 y.o. MRN: [id number]  Patient Care Team: Loman Brooklyn, FNP as PCP - General (Family Medicine) Minus Breeding, MD as PCP - Cardiology (Cardiology) Luanne Bras, MD (Radiology) Suella Broad, MD (Physical Medicine and Rehabilitation) Tanda Rockers, MD as Consulting Physician (Pulmonary Disease)  CC:  Chief Complaint  Patient presents with   New Patient (Initial Visit)    HPI Francisco Tanner presents to establish care. He is transferring care from Dr. Murrell Redden office as he has retired and the office has closed.   Patient reports he hasn't had his vitamin B-12 injection in about two months and is requesting to receive this today.   Patient sees Dr. Nelva Bush for pain management. He has T12, L1, L2, and L3 compression fractures status post cement augmentation. He reports all of his back problems were caused by his left hip. He states his hip came out of joint and he fell on the floor. He states he has had surgery on the left hip three times and it still isn't right. He has been told they will not operate anymore as he will not be cleared by cardiology. His cardiologist tells him, he will never  make it off the table.   Patient reports he takes Ambien 10 mg at night to force himself to go to sleep through the pain. He has been taking it for 10+ years. He still only sleeps for ~4 hours a night and he has to get up due to the pain. He will not take hydrocodone at night due to his severe COPD as he is afraid he will decrease his respirations to an unsafe level and not wake up in the morning.   Depression screen  [facility] 2/9 04/14/2019  Decreased Interest 0  Down, Depressed, Hopeless 0  PHQ - 2 Score 0  Altered sleeping 0  Tired, decreased energy 0  Change in appetite 0  Feeling bad or failure about yourself  0  Trouble concentrating 0  Moving slowly or fidgety/restless 0  Suicidal thoughts 0  PHQ-9 Score 0    Review of Systems  Constitutional: Negative for chills, fever, malaise/fatigue and weight loss.  HENT: Positive for hearing loss. Negative for congestion, ear discharge, ear pain, nosebleeds, sinus pain, sore throat and tinnitus.   Eyes: Negative for blurred vision, double vision, pain, discharge and redness.  Respiratory: Positive for cough, shortness of breath and wheezing.   Cardiovascular: Negative for chest pain, palpitations and leg swelling.  Gastrointestinal: Negative for abdominal pain, constipation, diarrhea, heartburn, nausea and vomiting.  Genitourinary: Negative for dysuria, frequency and urgency.  Musculoskeletal: Positive for back pain, joint pain and neck pain. Negative for myalgias.  Skin: Negative for rash.  Neurological: Negative for dizziness, seizures, weakness and headaches.  Endo/Heme/Allergies: Bruises/bleeds easily.  Psychiatric/Behavioral: Negative for depression, substance abuse and suicidal ideas. The patient is not nervous/anxious.     Current Outpatient Medications:    ELIQUIS 5 MG TABS tablet, TAKE ONE TABLET BY MOUTH TWICE DAILY, Disp: 60 tablet, Rfl: 10   fluticasone furoate-vilanterol (BREO ELLIPTA) 200-25 MCG/INH AEPB, USE 1 INHALATION DAILY, Disp: , Rfl:    HYDROcodone-acetaminophen (NORCO) 10-325 MG tablet, Take 1 tablet by mouth every 6 (six) hours as needed., Disp: , Rfl:    omeprazole (PRILOSEC) 20 MG capsule, Take 1 capsule (20 mg total) by mouth daily., Disp: 90 capsule, Rfl: 1   OXYGEN, 3lpm with sleep and 2.5lpm with exertion AHC, Disp: , Rfl:    sodium chloride (OCEAN) 0.65 % nasal spray, Place 1 spray into the nose as needed. For dryness,  Disp: , Rfl:    metoprolol succinate (TOPROL-XL) 25 MG 24 hr tablet, TAKE ONE (1) TABLET EACH DAY, Disp: 90 tablet, Rfl: 3   [START ON 05/02/2019] Suvorexant (BELSOMRA) 10 MG TABS, Take 10 mg by mouth at bedtime. (before you take nightly, alternate with Ambien every other night x3 weeks), Disp: 30 tablet, Rfl: 2   zolpidem (AMBIEN) 5 MG tablet, Take 1 tablet (5 mg) by mouth nightly x3 weeks, then alternate with belsomra every other night x3 weeks, then stop., Disp: 32 tablet, Rfl: 0  Current Facility-Administered Medications:    cyanocobalamin ((VITAMIN B-12)) injection 1,000 mcg, 1,000 mcg, Intramuscular, Q30 days, Blanch Media, Mickal Meno F, FNP, 1,000 mcg at 04/14/19 1420  Allergies  Allergen Reactions   Pravastatin Rash    Elevated heart rate per patient    Past Medical History:  Diagnosis Date   AAA (abdominal aortic aneurysm) (HCC)    Needs [place] on/after 06/25/2021. 28 mm   Abnormal CT scan    SPN right mid zone. first detected 08/16/08; see [facility]    Atrial flutter ([place])    a. echo 09/28/11 EF  50-55%, mild RAE, RV mildly dilated, systolic fxn mild-mod reduced;  b. 01/2012 s/p EPS & RFCA.   Chronic back pain    Chronic diastolic CHF (congestive heart failure) (HCC)    Claustrophobia    COPD (chronic obstructive pulmonary disease) ([place])    Wert. PFTs 10/09/08 FEV1 1.39 (44%) ratio 40, DLCO 63%. 16% improvement after bronchodilator. overnight pulse ox 02/28/09 5:75m pent with sat< 89 sleeping > declined further o2 07/05/09. HFA 75% 04/23/09.    Dyslipidemia 03/03/2016   GERD (gastroesophageal reflux disease)    Hypertension    Lung nodule    Prostate enlargement    Sleep apnea    Stop Bang score of 5. Pt has had sleep study and was told he didn't have Sleep Apnea.   Vitamin B12 deficiency 02/01/2018    Past Surgical History:  Procedure Laterality Date   A FLUTTER ABLATION N/A 02/02/2012   Procedure: ABLATION A FLUTTER;  Surgeon: Deboraha Sprang, MD;  [facility] CATH LAB;   Service: Cardiovascular;  Laterality: N/A;   BACK SURGERY     x4   CARDIOVERSION     in past   CATARACT EXTRACTION W/PHACO Right 09/04/2014   Procedure: CATARACT EXTRACTION PHACO AND INTRAOCULAR LENS PLACEMENT RIGHT EYE CDE=12.15;  Surgeon: Tonny Branch, MD;  Location: [facility] ORS;  Service: Ophthalmology;  Laterality: Right;   hip replacement surgery     L    INCISION AND DRAINAGE HIP  09/24/2011   Procedure: IRRIGATION AND DEBRIDEMENT HIP;  Surgeon: Gearlean Alf, MD;  Location: [facility] ORS;  Service: Orthopedics;  Laterality: Left;   KNEE SURGERY Left    KYPHOSIS SURGERY      Family History  Problem Relation Age of Onset   Diabetes Father    Emphysema Paternal Aunt        smoked and worked in a Web designer Neg Hx     Social History   Socioeconomic History   Marital status: Legally Separated    Spouse name: Not on file   Number of children: Not on file   Years of education: Not on file   Highest education level: Not on file  Occupational History   Not on file  Social Needs   Financial resource strain: Not on file   Food insecurity    Worry: Not on file    Inability: Not on file   Transportation needs    Medical: Not on file    Non-medical: Not on file  Tobacco Use   Smoking status: Former Smoker    Packs/day: 0.50    Years: 20.00    Pack years: 10.00    Types: Cigarettes    Quit date: 07/28/2002    Years since quitting: 16.7   Smokeless tobacco: Never Used  Substance and Sexual Activity   Alcohol use: Not Currently    Comment: occasionally at a social gathering   Drug use: No   Sexual activity: Not Currently  Lifestyle   Physical activity    Days per week: Not on file    Minutes per session: Not on file   Stress: Not on file  Relationships   Social connections    Talks on phone: Not on file    Gets together: Not on file    Attends religious service: Not on file    Active member of club or organization: Not on file    Attends  meetings of clubs or organizations: Not on file    Relationship status:  Not on file   Intimate partner violence    Fear of current or ex partner: Not on file    Emotionally abused: Not on file    Physically abused: Not on file    Forced sexual activity: Not on file  Other Topics Concern   Not on file  Social History Narrative   Separated; children; retired.     Objective:   Today's Vitals: BP 140/73    Pulse (!) 106    Temp 98.6 F (37 C) (Temporal)    Ht 5\' 7"  (1.702 m)    Wt 201 lb (91.2 kg)    SpO2 93%    BMI 31.48 kg/m   Physical Exam Vitals signs reviewed.  Constitutional:      General: He is not in acute distress.    Appearance: Normal appearance. He is obese. He is not ill-appearing, toxic-appearing or diaphoretic.  HENT:     Head: Normocephalic and atraumatic.  Eyes:     General: No scleral icterus.       Right eye: No discharge.        Left eye: No discharge.     Conjunctiva/sclera: Conjunctivae normal.  Neck:     Musculoskeletal: Normal range of motion.  Cardiovascular:     Rate and Rhythm: Normal rate and regular rhythm.     Heart sounds: Normal heart sounds. No murmur. No friction rub. No gallop.   Pulmonary:     Effort: Pulmonary effort is normal. No respiratory distress.     Breath sounds: Normal breath sounds. No stridor. No wheezing, rhonchi or rales.  Musculoskeletal: Normal range of motion.  Skin:    General: Skin is warm and dry.  Neurological:     Mental Status: He is alert and oriented to person, place, and time. Mental status is at baseline.  Psychiatric:        Mood and Affect: Mood normal.        Behavior: Behavior normal.        Thought Content: Thought content normal.        Judgment: Judgment normal.

## 2019-04-14 NOTE — Patient Instructions (Signed)
Zoster Vaccine, Recombinant injection What is this medicine? ZOSTER VACCINE (ZOS ter vak SEEN) is used to prevent shingles in adults 73 years old and over. This vaccine is not used to treat shingles or nerve pain from shingles. This medicine may be used for other purposes; ask your health care provider or pharmacist if you have questions. COMMON BRAND NAME(S): SHINGRIX What should I tell my health care provider before I take this medicine? They need to know if you have any of these conditions:  blood disorders or disease  cancer like leukemia or lymphoma  immune system problems or therapy  an unusual or allergic reaction to vaccines, other medications, foods, dyes, or preservatives  pregnant or trying to get pregnant  breast-feeding How should I use this medicine? This vaccine is for injection in a muscle. It is given by a health care professional. Talk to your pediatrician regarding the use of this medicine in children. This medicine is not approved for use in children. Overdosage: If you think you have taken too much of this medicine contact a poison control center or emergency room at once. NOTE: This medicine is only for you. Do not share this medicine with others. What if I miss a dose? Keep appointments for follow-up (booster) doses as directed. It is important not to miss your dose. Call your doctor or health care professional if you are unable to keep an appointment. What may interact with this medicine?  medicines that suppress your immune system  medicines to treat cancer  steroid medicines like prednisone or cortisone This list may not describe all possible interactions. Give your health care provider a list of all the medicines, herbs, non-prescription drugs, or dietary supplements you use. Also tell them if you smoke, drink alcohol, or use illegal drugs. Some items may interact with your medicine. What should I watch for while using this medicine? Visit your doctor for  regular check ups. This vaccine, like all vaccines, may not fully protect everyone. What side effects may I notice from receiving this medicine? Side effects that you should report to your doctor or health care professional as soon as possible:  allergic reactions like skin rash, itching or hives, swelling of the face, lips, or tongue  breathing problems Side effects that usually do not require medical attention (report these to your doctor or health care professional if they continue or are bothersome):  chills  headache  fever  nausea, vomiting  redness, warmth, pain, swelling or itching at site where injected  tiredness This list may not describe all possible side effects. Call your doctor for medical advice about side effects. You may report side effects to FDA at 1-800-FDA-1088. Where should I keep my medicine? This vaccine is only given in a clinic, pharmacy, doctor's office, or other health care setting and will not be stored at home. NOTE: This sheet is a summary. It may not cover all possible information. If you have questions about this medicine, talk to your doctor, pharmacist, or health care provider.  2020 Elsevier/Gold Standard (2017-02-23 13:20:30)  

## 2019-04-15 ENCOUNTER — Encounter: Payer: Self-pay | Admitting: Family Medicine

## 2019-04-20 ENCOUNTER — Ambulatory Visit: Payer: Medicare Other | Admitting: Cardiology

## 2019-04-20 DIAGNOSIS — I714 Abdominal aortic aneurysm, without rupture, unspecified: Secondary | ICD-10-CM | POA: Insufficient documentation

## 2019-04-20 NOTE — Progress Notes (Signed)
Virtual Visit via Telephone Note   This visit type was conducted due to national recommendations for restrictions regarding the COVID-19 Pandemic (e.g. social distancing) in an effort to limit this patient's exposure and mitigate transmission in our community.  Due to his co-morbid illnesses, this patient is at least at moderate risk for complications without adequate follow up.  This format is felt to be most appropriate for this patient at this time.  The patient did not have access to video technology/had technical difficulties with video requiring transitioning to audio format only (telephone).  All issues noted in this document were discussed and addressed.  No physical exam could be performed with this format.  Please refer to the patient's chart for his  consent to telehealth for [facility].   Date:  04/21/2019   ID:  Francisco Tanner, DOB [birth year], MRN [id number]  Patient Location: Home Provider Location: Home  PCP:  Loman Brooklyn, FNP  Cardiologist:  Minus Breeding, MD  Electrophysiologist:  None   Evaluation Performed:  Follow-Up Visit  Chief Complaint:  Diastolic HF.   History of Present Illness:    Francisco Tanner is a 73 y.o. male with who presents for follow up of  diastolic heart failure.   I had a virtual visit with him earlier this year.   He had atrial flutter with ablation but went back into atrial flutter.  He has been managed with rate control and anticoagulation.  He had an echo last 2017 with normal LV function and some evidence of elevated pulmonary pressure.   He wears O2 at night and PRN.   He had significant back pain and there was a small chance that this was associated with Xarelto.  We stopped the Xarelto and this helped the back.    He is very limited in his activities because of his back pain.  He has a family member help him with groceries.  He can get around in the house.  He is on oxygen 24/7.  He is not having any overt cardiovascular complaints.  He does  not really feel palpitations, presyncope or syncope.  He does not have chest pressure, neck or arm discomfort.  The patient does not have symptoms concerning for COVID-19 infection (fever, chills, cough, or new shortness of breath).    Past Medical History:  Diagnosis Date  . AAA (abdominal aortic aneurysm) ([place])    Needs [place] on/after 06/25/2021. 28 mm  . Abnormal CT scan    SPN right mid zone. first detected 08/16/08; see [facility]   . Atrial flutter ([place])    a. echo 09/28/11 EF 50-55%, mild RAE, RV mildly dilated, systolic fxn mild-mod reduced;  b. 01/2012 s/p EPS & RFCA.  [name] Chronic back pain   . Chronic diastolic CHF (congestive heart failure) ([place])   . Claustrophobia   . COPD (chronic obstructive pulmonary disease) ([place])    Wert. PFTs 10/09/08 FEV1 1.39 (44%) ratio 40, DLCO 63%. 16% improvement after bronchodilator. overnight pulse ox 02/28/09 5:40m pent with sat< 89 sleeping > declined further o2 07/05/09. HFA 75% 04/23/09.   [name] Dyslipidemia 03/03/2016  . GERD (gastroesophageal reflux disease)   . Hypertension   . Lung nodule   . Prostate enlargement   . Sleep apnea    Stop Bang score of 5. Pt has had sleep study and was told he didn't have Sleep Apnea.  . Vitamin B12 deficiency 02/01/2018   Past Surgical History:  Procedure Laterality Date  . A FLUTTER ABLATION  N/A 02/02/2012   Procedure: ABLATION A FLUTTER;  Surgeon: Deboraha Sprang, MD;  Location[facility] CATH LAB;  Service: Cardiovascular;  Laterality: N/A;  . BACK SURGERY     x4  . CARDIOVERSION     in past  . CATARACT EXTRACTION W/PHACO Right 09/04/2014   Procedure: CATARACT EXTRACTION PHACO AND INTRAOCULAR LENS PLACEMENT RIGHT EYE CDE=12.15;  Surgeon: Tonny Branch, MD;  Location: [facility] ORS;  Service: Ophthalmology;  Laterality: Right;  . hip replacement surgery     L   . INCISION AND DRAINAGE HIP  09/24/2011   Procedure: IRRIGATION AND DEBRIDEMENT HIP;  Surgeon: Gearlean Alf, MD;  Location: [facility] ORS;  Service: Orthopedics;  Laterality: Left;   . KNEE SURGERY Left   . KYPHOSIS SURGERY       Current Meds  Medication Sig  . ELIQUIS 5 MG TABS tablet TAKE ONE TABLET BY MOUTH TWICE DAILY  . fluticasone furoate-vilanterol (BREO ELLIPTA) 200-25 MCG/INH AEPB USE 1 INHALATION DAILY  . HYDROcodone-acetaminophen (NORCO) 10-325 MG tablet Take 1 tablet by mouth every 6 (six) hours as needed.  . metoprolol succinate (TOPROL-XL) 25 MG 24 hr tablet TAKE ONE (1) TABLET EACH DAY  . omeprazole (PRILOSEC) 20 MG capsule Take 1 capsule (20 mg total) by mouth daily.  . OXYGEN 3lpm with sleep and 2.5lpm with exertion AHC  . sodium chloride (OCEAN) 0.65 % nasal spray Place 1 spray into the nose as needed. For dryness  . zolpidem (AMBIEN) 5 MG tablet Take 1 tablet (5 mg) by mouth nightly x3 weeks, then alternate with belsomra every other night x3 weeks, then stop.   Current Facility-Administered Medications for the 04/21/19 encounter (Telemedicine) with Minus Breeding, MD  Medication  . cyanocobalamin ((VITAMIN B-12)) injection 1,000 mcg     Allergies:   Pravastatin   Social History   Tobacco Use  . Smoking status: Former Smoker    Packs/day: 0.50    Years: 20.00    Pack years: 10.00    Types: Cigarettes    Quit date: 07/28/2002    Years since quitting: 16.7  . Smokeless tobacco: Never Used  Substance Use Topics  . Alcohol use: Not Currently    Comment: occasionally at a social gathering  . Drug use: No     Family Hx: The patient's family history includes Diabetes in his father; Emphysema in his paternal aunt. There is no history of Cancer.  ROS:   Please see the history of present illness.    Back pain, joint pain All other systems reviewed and are negative.   Prior CV studies:   The following studies were reviewed today:    Labs/Other Tests and Data Reviewed:    EKG:  No ECG reviewed.  Recent Labs: No results found for requested labs within last 8760 hours.   Recent Lipid Panel Lab Results  Component Value Date/Time    CHOL  10/28/2009 05:45 AM    126        ATP III CLASSIFICATION:  <200     mg/dL   Desirable  200-239  mg/dL   Borderline High  >=240    mg/dL   High          TRIG 57 10/28/2009 05:45 AM   HDL 28 (L) 10/28/2009 05:45 AM   CHOLHDL 4.5 10/28/2009 05:45 AM   LDLCALC  10/28/2009 05:45 AM    87        Total Cholesterol/HDL:CHD Risk Coronary Heart Disease Risk Table  Men   Women  1/2 Average Risk   3.4   3.3  Average Risk       5.0   4.4  2 X Average Risk   9.6   7.1  3 X Average Risk  23.4   11.0        Use the calculated Patient Ratio above and the CHD Risk Table to determine the patient's CHD Risk.        ATP III CLASSIFICATION (LDL):  <100     mg/dL   Optimal  100-129  mg/dL   Near or Above                    Optimal  130-159  mg/dL   Borderline  160-189  mg/dL   High  >190     mg/dL   Very High    Wt Readings from Last 3 Encounters:  04/21/19 198 lb 1.6 oz (89.9 kg)  04/14/19 201 lb (91.2 kg)  10/27/18 201 lb (91.2 kg)     Objective:    Vital Signs:  BP (!) 152/77   Pulse 65   Ht 5' 7.5" (1.715 m)   Wt 198 lb 1.6 oz (89.9 kg)   SpO2 94%   BMI 30.57 kg/m    VITAL SIGNS:  reviewed  ASSESSMENT & PLAN:    Chronic Diastolic CHF  He seems to be euvolemic by history.  No change in therapy.   Atrial Flutter  Mr. Deonne Corniel has a CHA2DS2 - VASc score of 4.  Continue current therapy.   Cardiomyopathy He would not be able to be on our table for repeat echo he has such profound pain.  No change in therapy.   Hypertension  BP is slightly high but this is unusual.  I reviewed some previous blood pressures and he says he is usually well controlled.  No change in therapy.  AAA This was 3.2 cm in 2018 and I reviewed these reports.  He is unable to lie on the table anymore to get the exams.  No further exams are available.  He wants very conservative therapy.  DYSLIPIDEMIA Patient has some mild dyslipidemia with an LDL of 104 and HDL of 59 in  June.  I reviewed these labs.  No change in therapy.  COVID-19 Education: The signs and symptoms of COVID-19 were discussed with the patient and how to seek care for testing (follow up with PCP or arrange E-visit).  The importance of social distancing was discussed today.  Time:   Today, I have spent 15 minutes with the patient with telehealth technology discussing the above problems.     Medication Adjustments/Labs and Tests Ordered: Current medicines are reviewed at length with the patient today.  Concerns regarding medicines are outlined above.   Tests Ordered: No orders of the defined types were placed in this encounter.   Medication Changes: No orders of the defined types were placed in this encounter.   Follow Up:  Virtual Visit in one year.   Signed, Minus Breeding, MD  04/21/2019 8:39 AM    [facility]

## 2019-04-21 ENCOUNTER — Encounter: Payer: Self-pay | Admitting: Cardiology

## 2019-04-21 ENCOUNTER — Telehealth: Payer: Self-pay

## 2019-04-21 ENCOUNTER — Telehealth (INDEPENDENT_AMBULATORY_CARE_PROVIDER_SITE_OTHER): Payer: Medicare Other | Admitting: Cardiology

## 2019-04-21 VITALS — BP 152/77 | HR 65 | Ht 67.5 in | Wt 198.1 lb

## 2019-04-21 DIAGNOSIS — I714 Abdominal aortic aneurysm, without rupture, unspecified: Secondary | ICD-10-CM

## 2019-04-21 DIAGNOSIS — I10 Essential (primary) hypertension: Secondary | ICD-10-CM

## 2019-04-21 DIAGNOSIS — I4892 Unspecified atrial flutter: Secondary | ICD-10-CM

## 2019-04-21 DIAGNOSIS — I5032 Chronic diastolic (congestive) heart failure: Secondary | ICD-10-CM

## 2019-04-21 NOTE — Patient Instructions (Addendum)

## 2019-05-05 ENCOUNTER — Encounter: Payer: Self-pay | Admitting: Family Medicine

## 2019-05-05 ENCOUNTER — Telehealth: Payer: Self-pay | Admitting: Family Medicine

## 2019-05-05 NOTE — Telephone Encounter (Signed)
Patient wanted clarification on his Ambien and Belsomra; all questions were answered. However he did disclose to me that the prescription for Belsomra was >$400. I asked him to try the Ambien at the lower dose of 5 mg QHS and see if he gets good rest. If he does, I will continue the Ambien at the lower dosage versus paying that much for Belsomra. I also have completed the referral form for the [facility] as he has other medications that are also costing him ~$400 a piece (Breo and Eliquis).

## 2019-05-05 NOTE — Telephone Encounter (Signed)
Returned patients call.  Patient states he would like for Britney to call him.

## 2019-05-17 ENCOUNTER — Ambulatory Visit (INDEPENDENT_AMBULATORY_CARE_PROVIDER_SITE_OTHER): Payer: Medicare Other

## 2019-05-17 ENCOUNTER — Other Ambulatory Visit: Payer: Self-pay

## 2019-05-17 DIAGNOSIS — E538 Deficiency of other specified B group vitamins: Secondary | ICD-10-CM | POA: Diagnosis not present

## 2019-05-17 NOTE — Progress Notes (Signed)
Cyanocobalamin injection given to left deltoid.  Patient tolerated well. 

## 2019-05-23 ENCOUNTER — Ambulatory Visit (INDEPENDENT_AMBULATORY_CARE_PROVIDER_SITE_OTHER): Payer: Medicare Other | Admitting: *Deleted

## 2019-05-23 DIAGNOSIS — Z0000 Encounter for general adult medical examination without abnormal findings: Secondary | ICD-10-CM

## 2019-05-23 NOTE — Progress Notes (Addendum)
MEDICARE ANNUAL WELLNESS VISIT  05/23/2019  Telephone Visit Disclaimer This Medicare AWV was conducted by telephone due to national recommendations for restrictions regarding the COVID-19 Pandemic (e.g. social distancing).  I verified, using two identifiers, that I am speaking with Francisco Tanner or their authorized healthcare agent. I discussed the limitations, risks, security, and privacy concerns of performing an evaluation and management service by telephone and the potential availability of an in-person appointment in the future. The patient expressed understanding and agreed to proceed.   Subjective:  Francisco Tanner is a 73 y.o. male patient of Francisco Brooklyn, FNP who had a Medicare Annual Wellness Visit today via telephone. Francisco Tanner is Retired , divorced and lives alone. he has one biological child and two step-children. he reports that he is socially active and does interact with friends/family regularly when they visit or call him. he is minimally physically active and enjoys reading and watching television.  Patient Care Team: Francisco Brooklyn, FNP as PCP - General (Family Medicine) Francisco Breeding, MD as PCP - Cardiology (Cardiology) Francisco Bras, MD (Radiology) Francisco Broad, MD (Physical Medicine and Rehabilitation) Francisco Rockers, MD as Consulting Physician (Pulmonary Disease)  Advanced Directives 05/23/2019 09/25/2016 03/13/2016 08/31/2014 04/09/2012 01/28/2012 09/23/2011  Does Patient Have a Medical Advance Directive? Yes Yes No No Patient does not have advance directive Patient does not have advance directive;Patient would not like information Patient does not have advance directive;Patient would not like information  Type of Advance Directive - [place]  Does patient want to make changes to medical advance directive? No - Patient declined - - - - - -  Would patient like information on creating a medical advance directive? - - - No - patient  declined information - - -  Pre-existing out of facility DNR order (yellow form or pink MOST form) - - - - - No -    Hospital Utilization Over the Past 12 Months: # of hospitalizations or ER visits: 0 # of surgeries: 0  Review of Systems    Patient reports that his overall health is unchanged compared to last year.  Needs referral for Dexascan in the future.  Patient Reported Readings (BP, Pulse, CBG, Weight, etc) none  Pain Assessment Pain : 0-10 Pain Score: 6  Pain Type: Chronic pain Pain Location: Back Pain Orientation: Left, Lateral Pain Radiating Towards: Left hip and back radiate pain constantly. Pain Descriptors / Indicators: Radiating, Constant, Aching Pain Onset: (Constant since surgeries.) Pain Relieving Factors: Pain medication. Effect of Pain on Daily Activities: Limits every day functioning.  Pain Relieving Factors: Pain medication.  Current Medications & Allergies (verified) Allergies as of 05/23/2019      Reactions   Temazepam    Pravastatin Rash   Elevated heart rate per patient      Medication List       Accurate as of May 23, 2019  3:25 PM. If you have any questions, ask your nurse or doctor.        Belsomra 10 MG Tabs Generic drug: Suvorexant Take 10 mg by mouth at bedtime. (before you take nightly, alternate with Ambien every other night x3 weeks)   Breo Ellipta 200-25 MCG/INH Aepb Generic drug: fluticasone furoate-vilanterol USE 1 INHALATION DAILY   Eliquis 5 MG Tabs tablet Generic drug: apixaban TAKE ONE TABLET BY MOUTH TWICE DAILY   HYDROcodone-acetaminophen 10-325 MG tablet Commonly known as: NORCO Take 1 tablet by mouth every 6 (six) hours as  needed.   metoprolol succinate 25 MG 24 hr tablet Commonly known as: TOPROL-XL TAKE ONE (1) TABLET EACH DAY   omeprazole 20 MG capsule Commonly known as: PRILOSEC Take 1 capsule (20 mg total) by mouth daily.   OXYGEN 3lpm with sleep and 2.5lpm with exertion AHC   sodium  chloride 0.65 % nasal spray Commonly known as: OCEAN Place 1 spray into the nose as needed. For dryness   zolpidem 5 MG tablet Commonly known as: AMBIEN Take 1 tablet (5 mg) by mouth nightly x3 weeks, then alternate with belsomra every other night x3 weeks, then stop.       History (reviewed): Past Medical History:  Diagnosis Date   AAA (abdominal aortic aneurysm) ([place])    Needs [place] on/after 06/25/2021. 28 mm   Abnormal CT scan    SPN right mid zone. first detected 08/16/08; see [facility]    Atrial flutter ([place])    a. echo 09/28/11 EF 50-55%, mild RAE, RV mildly dilated, systolic fxn mild-mod reduced;  b. 01/2012 s/p EPS & RFCA.   Chronic back pain    Chronic diastolic CHF (congestive heart failure) (H[facility])    Claustrophobia    COPD (chronic obstructive pulmonary disease) ([place])    Wert. PFTs 10/09/08 FEV1 1.39 (44%) ratio 40, DLCO 63%. 16% improvement after bronchodilator. overnight pulse ox 02/28/09 5:60m pent with sat< 89 sleeping > declined further o2 07/05/09. HFA 75% 04/23/09.    Dyslipidemia 03/03/2016   GERD (gastroesophageal reflux disease)    Hypertension    Lung nodule    Prostate enlargement    Sleep apnea    Stop Bang score of 5. Pt has had sleep study and was told he didn't have Sleep Apnea.   Vitamin B12 deficiency 02/01/2018   Past Surgical History:  Procedure Laterality Date   A FLUTTER ABLATION N/A 02/02/2012   Procedure: ABLATION A FLUTTER;  Surgeon: Deboraha Sprang, MD;  Location: [facility] CATH LAB;  Service: Cardiovascular;  Laterality: N/A;   BACK SURGERY     x4   CARDIOVERSION     in past   CATARACT EXTRACTION W/PHACO Right 09/04/2014   Procedure: CATARACT EXTRACTION PHACO AND INTRAOCULAR LENS PLACEMENT RIGHT EYE CDE=12.15;  Surgeon: Tonny Branch, MD;  Location: [facility] ORS;  Service: Ophthalmology;  Laterality: Right;   hip replacement surgery     L    INCISION AND DRAINAGE HIP  09/24/2011   Procedure: IRRIGATION AND DEBRIDEMENT HIP;  Surgeon: Gearlean Alf,  MD;  Location: [facility] ORS;  Service: Orthopedics;  Laterality: Left;   KNEE SURGERY Left    KYPHOSIS SURGERY     Family History  Problem Relation Age of Onset   Diabetes Father    Emphysema Paternal Aunt        smoked and worked in a Web designer Neg Hx    Social History   Socioeconomic History   Marital status: Legally Separated    Spouse name: Not on file   Number of children: Not on file   Years of education: Not on file   Highest education level: Not on file  Occupational History   Not on file  Social Needs   Financial resource strain: Not on file   Food insecurity    Worry: Not on file    Inability: Not on file   Transportation needs    Medical: Not on file    Non-medical: Not on file  Tobacco Use   Smoking status: Former Smoker  Packs/day: 0.50    Years: 20.00    Pack years: 10.00    Types: Cigarettes    Quit date: 07/28/2002    Years since quitting: 16.8   Smokeless tobacco: Never Used  Substance and Sexual Activity   Alcohol use: Not Currently    Comment: occasionally at a social gathering   Drug use: No   Sexual activity: Not Currently  Lifestyle   Physical activity    Days per week: Not on file    Minutes per session: Not on file   Stress: Not on file  Relationships   Social connections    Talks on phone: Not on file    Gets together: Not on file    Attends religious service: Not on file    Active member of club or organization: Not on file    Attends meetings of clubs or organizations: Not on file    Relationship status: Not on file  Other Topics Concern   Not on file  Social History Narrative   Separated; children; retired.     Activities of Daily Living In your present state of health, do you have any difficulty performing the following activities: 05/23/2019 05/23/2019  Hearing? N N  Vision? N N  Difficulty concentrating or making decisions? N N  Walking or climbing stairs? Y Y  Dressing or bathing? N N    Doing errands, shopping? Y N  Comment Family and friends help. Family helps.  Preparing Food and eating ? - N  Using the Toilet? - N  In the past six months, have you accidently leaked urine? - N  Do you have problems with loss of bowel control? - N  Managing your Medications? - N  Managing your Finances? - N  Housekeeping or managing your Housekeeping? - N  Comment - Pays for help with cleaning home.  Some recent data might be hidden    Patient Education/ Literacy How often do you need to have someone help you when you read instructions, pamphlets, or other written materials from your doctor or pharmacy?: 1 - Never  Exercise Current Exercise Habits: The patient does not participate in regular exercise at present(Due to hip and back sureries in the past , he is limited to walking around house only.)  Diet Patient reports consuming 2 meals a day and 2 snack(s) a day Patient reports that his primary diet is: Regular Patient reports that she does have regular access to food.   Depression Screen PHQ 2/9 Scores 05/23/2019 04/14/2019  PHQ - 2 Score 0 0  PHQ- 9 Score 0 0     Fall Risk Fall Risk  05/23/2019 05/23/2019 04/14/2019  Falls in the past year? 0 0 0  Risk for fall due to : - Impaired mobility;Impaired balance/gait -  Follow up - Falls prevention discussed -     Objective:  Francisco Tanner seemed alert and oriented and he participated appropriately during our telephone visit.  Blood Pressure Weight BMI  BP Readings from Last 3 Encounters:  04/21/19 (!) 152/77  04/14/19 140/73  10/27/18 130/75   Wt Readings from Last 3 Encounters:  04/21/19 198 lb 1.6 oz (89.9 kg)  04/14/19 201 lb (91.2 kg)  10/27/18 201 lb (91.2 kg)   BMI Readings from Last 1 Encounters:  04/21/19 30.57 kg/m    *Unable to obtain current vital signs, weight, and BMI due to telephone visit type  Hearing/Vision   Aleksa did not seem to have difficulty with hearing/understanding during the telephone  conversation  Reports that he has had a formal eye exam by an eye care professional within the past year around May.  Reports that he has had a formal hearing evaluation within the past year at a free clinic. *Unable to fully assess hearing and vision during telephone visit type  Cognitive Function: 6CIT Screen 05/23/2019 05/23/2019  What Year? 4 points 4 points  What month? 0 points 0 points  What time? 0 points 0 points  Count back from 20 0 points 0 points  Months in reverse 0 points 0 points  Repeat phrase 2 points 2 points  Total Score 6 6   (Normal:0-7, Significant for Dysfunction: >8)  Normal Cognitive Function Screening: Yes   Immunization & Health Maintenance Record Immunization History  Administered Date(s) Administered   Influenza Split 05/28/2012, 05/04/2014   Influenza, High Dose Seasonal PF 05/10/2015, 05/28/2016, 04/22/2017, 05/19/2018   Influenza,inj,Quad PF,6+ Mos 04/14/2019   Influenza-Unspecified 04/27/2013   Pneumococcal Conjugate-13 06/14/2015   Pneumococcal Polysaccharide-23 04/28/2011, 09/15/2016    Health Maintenance  Topic Date Due   TETANUS/TDAP  11/20/1964   COLONOSCOPY  06/30/2023   INFLUENZA VACCINE  Completed   Hepatitis C Screening  Completed   PNA vac Low Risk Adult  Completed       Assessment  This is a routine wellness examination for Francisco Tanner.  Health Maintenance: Due or Overdue Health Maintenance Due  Topic Date Due   TETANUS/TDAP  11/20/1964    Francisco Tanner does not need a referral for Community Assistance: Care Management:   no Social Work:    no Prescription Assistance:  no Nutrition/Diabetes Education:  no   Plan:  Personalized Goals Goals Addressed            This Visit's Progress    Client will increase activity tolerance by       Moving arms, lower legs and feet when sitting to improve blood circulation and muscle tone.     Have 3 meals a day       Eats two meals on some days.      Prevent falls       Limited because of oxygen, back and hip surgery.     Prevent falls       Patient has area rugs and was asked if he could consider removing any that were truly unnecessary to prevent falls.  Advised to clear regular walking areas through the house.  He has a walker that he uses most of the time and is on continuous oxygen which can hinder smooth transitions from room to room.      Personalized Health Maintenance & Screening Recommendations  Td vaccine Shingrix immunization, dexa scan  Lung Cancer Screening Recommended: no (Low Dose CT Chest recommended if Age 11-80 years, 30 pack-year currently smoking OR have quit w/in past 15 years) Hepatitis C Screening recommended: done 08-03-2017 HIV Screening recommended: no  Advanced Directives: Written information was not prepared per patient's request.  Referrals & Orders No orders of the defined types were placed in this encounter.   Follow-up Plan  Follow-up with Francisco Brooklyn, FNP as planned  Schedule prn for acute illness  Needs dexascan , td,shingrix   I have personally reviewed and noted the following in the patients chart:    Medical and social history  Use of alcohol, tobacco or illicit drugs   Current medications and supplements  Functional ability and status  Nutritional status  Physical activity  Advanced directives  List of other physicians  Hospitalizations, surgeries, and ER visits in previous 12 months  Vitals  Screenings to include cognitive, depression, and falls  Referrals and appointments  In addition, I have reviewed and discussed with Francisco Tanner certain preventive protocols, quality metrics, and best practice recommendations. A written personalized care plan for preventive services as well as general preventive health recommendations is available and can be mailed to the patient at his request.      Johnell Comings LPN [phone]

## 2019-05-23 NOTE — Patient Instructions (Addendum)
Preventive Care 89 Years and Older, Male Preventive care refers to lifestyle choices and visits with your health care provider that can promote health and wellness. This includes:  A yearly physical exam. This is also called an annual well check.  Regular dental and eye exams.  Immunizations.  Screening for certain conditions.  Healthy lifestyle choices, such as diet and exercise. What can I expect for my preventive care visit? Physical exam Your health care provider will check:  Height and weight. These may be used to calculate body mass index (BMI), which is a measurement that tells if you are at a healthy weight.  Heart rate and blood pressure.  Your skin for abnormal spots. Counseling Your health care provider may ask you questions about:  Alcohol, tobacco, and drug use.  Emotional well-being.  Home and relationship well-being.  Sexual activity.  Eating habits.  History of falls.  Memory and ability to understand (cognition).  Work and work Statistician. What immunizations do I need?  Influenza (flu) vaccine  This is recommended every year. Tetanus, diphtheria, and pertussis (Tdap) vaccine  You may need a Td booster every 10 years. Varicella (chickenpox) vaccine  You may need this vaccine if you have not already been vaccinated. Zoster (shingles) vaccine  You may need this after age 70. Pneumococcal conjugate (PCV13) vaccine  One dose is recommended after age 40. Pneumococcal polysaccharide (PPSV23) vaccine  One dose is recommended after age 24. Measles, mumps, and rubella (MMR) vaccine  You may need at least one dose of MMR if you were born in 1957 or later. You may also need a second dose. Meningococcal conjugate (MenACWY) vaccine  You may need this if you have certain conditions. Hepatitis A vaccine  You may need this if you have certain conditions or if you travel or work in places where you may be exposed to hepatitis A. Hepatitis B  vaccine  You may need this if you have certain conditions or if you travel or work in places where you may be exposed to hepatitis B. Haemophilus influenzae type b (Hib) vaccine  You may need this if you have certain conditions. You may receive vaccines as individual doses or as more than one vaccine together in one shot (combination vaccines). Talk with your health care provider about the risks and benefits of combination vaccines. What tests do I need? Blood tests  Lipid and cholesterol levels. These may be checked every 5 years, or more frequently depending on your overall health.  Hepatitis C test.  Hepatitis B test. Screening  Lung cancer screening. You may have this screening every year starting at age 67 if you have a 30-pack-year history of smoking and currently smoke or have quit within the past 15 years.  Colorectal cancer screening. All adults should have this screening starting at age 77 and continuing until age 8. Your health care provider may recommend screening at age 74 if you are at increased risk. You will have tests every 1-10 years, depending on your results and the type of screening test.  Prostate cancer screening. Recommendations will vary depending on your family history and other risks.  Diabetes screening. This is done by checking your blood sugar (glucose) after you have not eaten for a while (fasting). You may have this done every 1-3 years.  Abdominal aortic aneurysm (AAA) screening. You may need this if you are a current or former smoker.  Sexually transmitted disease (STD) testing. Follow these instructions at home: Eating and drinking  Eat  a diet that includes fresh fruits and vegetables, whole grains, lean protein, and low-fat dairy products. Limit your intake of foods with high amounts of sugar, saturated fats, and salt.  Take vitamin and mineral supplements as recommended by your health care provider.  Do not drink alcohol if your health care  provider tells you not to drink.  If you drink alcohol: ? Limit how much you have to 0-2 drinks a day. ? Be aware of how much alcohol is in your drink. In the [place] one drink equals one 12 oz bottle of beer (355 mL), one 5 oz glass of wine (148 mL), or one 1 oz glass of hard liquor (44 mL). Lifestyle  Take daily care of your teeth and gums.  Stay active. Exercise for at least 30 minutes on 5 or more days each week.  Do not use any products that contain nicotine or tobacco, such as cigarettes, e-cigarettes, and chewing tobacco. If you need help quitting, ask your health care provider.  If you are sexually active, practice safe sex. Use a condom or other form of protection to prevent STIs (sexually transmitted infections).  Talk with your health care provider about taking a low-dose aspirin or statin. What's next?  Visit your health care provider once a year for a well check visit.  Ask your health care provider how often you should have your eyes and teeth checked.  Stay up to date on all vaccines. This information is not intended to replace advice given to you by your health care provider. Make sure you discuss any questions you have with your health care provider. Document Released: 08/10/2015 Document Revised: 07/08/2018 Document Reviewed: 07/08/2018 Elsevier Patient Education  2020 [employer].  Francisco Tanner , Thank you for taking time to come for your Medicare Wellness Visit. I appreciate your ongoing commitment to your health goals. Please review the following plan we discussed and let me know if I can assist you in the future.   These are the goals we discussed: Goals    . Client will increase activity tolerance by     Moving arms, lower legs and feet when sitting to improve blood circulation and muscle tone.    . Have 3 meals a day     Eats two meals on some days.    . Prevent falls     Limited because of oxygen, back and hip surgery.    . Prevent falls     Patient  has area rugs and was asked if he could consider removing any that were truly unnecessary to prevent falls.  Advised to clear regular walking areas through the house.  He has a walker that he uses most of the time and is on continuous oxygen which can hinder smooth transitions from room to room.       This is a list of the screening recommended for you and due dates:  Health Maintenance  Topic Date Due  . Tetanus Vaccine  11/20/1964  . Colon Cancer Screening  06/30/2023  . Flu Shot  Completed  .  Hepatitis C: One time screening is recommended by Center for Disease Control  (CDC) for  adults born from [birth year] through 1965.   Completed  . Pneumonia vaccines  Completed

## 2019-05-30 ENCOUNTER — Telehealth: Payer: Self-pay | Admitting: Family Medicine

## 2019-06-17 MED ORDER — ESZOPICLONE 1 MG PO TABS: 1.0000 mg | ORAL_TABLET | Freq: Every evening | ORAL | 0 refills | Status: DC | PRN

## 2019-06-17 NOTE — Telephone Encounter (Signed)
Called pt :  It appears that he called on 05/30/19 and the person who took the call did not forward it to you.  Reaction : about 20-30 min after taking it - he felt Hot, sweats , trouble breathing. This lasted about 30 min and then could not sleep at all.  He did this x 2 nights and same reaction.  He is not sleeping, lost 15 lbs in the last month.  Please advise on what he can do ( he had been waiting on [place] since 11/2 -I told him I would do my best to get back to him today)    Med added to allergy list

## 2019-06-17 NOTE — Telephone Encounter (Signed)
Patient called again stating that he originally called on 05/30/19 regarding Rx he was given (Belsomra) that he can't take anymore because it caused him to have an allergic reaction. Has not heard back from anyone about it yet.

## 2019-06-17 NOTE — Telephone Encounter (Signed)
Pt called aware of med

## 2019-06-20 ENCOUNTER — Ambulatory Visit (INDEPENDENT_AMBULATORY_CARE_PROVIDER_SITE_OTHER): Payer: Medicare Other

## 2019-06-20 ENCOUNTER — Other Ambulatory Visit: Payer: Self-pay

## 2019-06-20 DIAGNOSIS — E538 Deficiency of other specified B group vitamins: Secondary | ICD-10-CM | POA: Diagnosis not present

## 2019-06-20 NOTE — Progress Notes (Signed)
Cyanocobalamin injection given to left deltoid.  Patient tolerated well. 

## 2019-07-05 ENCOUNTER — Telehealth: Payer: Self-pay | Admitting: Family Medicine

## 2019-07-05 NOTE — Telephone Encounter (Signed)
Pt wanted something called in for his back/hip pain. Advised pt he would need a visit to discuss or he could call Dr Nelva Bush since Dr Nelva Bush has been treating him for this. Pt then stated his sleeping medication wasn't working and wanted it changed. Advised pt he would need a visit with Brittney to discuss and appt was scheduled with [facility] 12/10 at 8:35.

## 2019-07-07 ENCOUNTER — Encounter: Payer: Self-pay | Admitting: Family Medicine

## 2019-07-07 ENCOUNTER — Ambulatory Visit (INDEPENDENT_AMBULATORY_CARE_PROVIDER_SITE_OTHER): Payer: Medicare Other | Admitting: Family Medicine

## 2019-07-07 DIAGNOSIS — G479 Sleep disorder, unspecified: Secondary | ICD-10-CM

## 2019-07-07 MED ORDER — ZOLPIDEM TARTRATE 10 MG PO TABS: 10.0000 mg | ORAL_TABLET | Freq: Every evening | ORAL | 2 refills | Status: DC | PRN

## 2019-07-07 NOTE — Progress Notes (Signed)
Virtual Visit via Telephone Note  I connected with Francisco Tanner on 07/07/19 at 8:41 AM by telephone and verified that I am speaking with the correct person using two identifiers. Francisco Tanner is currently located at home and nobody is currently with him during this visit. The provider, Loman Brooklyn, FNP is located in their home at time of visit.  I discussed the limitations, risks, security and privacy concerns of performing an evaluation and management service by telephone and the availability of in person appointments. I also discussed with the patient that there may be a patient responsible charge related to this service. The patient expressed understanding and agreed to proceed.  Subjective: PCP: Loman Brooklyn, FNP  Chief Complaint  Patient presents with   Insomnia   Patient did call yesterday due to some back pain but has since gotten a prescription for some prednisone from Dr. Nelva Bush' office.  He reports this happens from time to time and the prednisone always takes care of it.  Patient reports he is still not sleeping.  When he first started coming to me he was on Ambien 10 mg and I was trying to get him off of this.  He has an intolerance to temazepam and Belsomra.  He has also failed therapy with Lunesta and melatonin.  He did try Ambien 5 mg but was unable to sleep through the night with that.  Patient reports a rather poor quality of life due to the amount of pain that he is in.  States it takes him an hour and 1/2 to 2 hours to do a task that would take a normal person 5 minutes.  It takes him close to an hour and a half just to take a shower.  He does not go anywhere.  He has only left the house twice since the current pandemic.  He understands the risk associated with using Ambien but just asked that he be put back on it so that he can at least sleep.     ROS: Per HPI  Current Outpatient Medications:    ELIQUIS 5 MG TABS tablet, TAKE ONE TABLET BY MOUTH TWICE DAILY, Disp:  60 tablet, Rfl: 10   fluticasone furoate-vilanterol (BREO ELLIPTA) 200-25 MCG/INH AEPB, USE 1 INHALATION DAILY, Disp: , Rfl:    HYDROcodone-acetaminophen (NORCO) 10-325 MG tablet, Take 1 tablet by mouth every 6 (six) hours as needed., Disp: , Rfl:    metoprolol succinate (TOPROL-XL) 25 MG 24 hr tablet, TAKE ONE (1) TABLET EACH DAY, Disp: 90 tablet, Rfl: 3   omeprazole (PRILOSEC) 20 MG capsule, Take 1 capsule (20 mg total) by mouth daily., Disp: 90 capsule, Rfl: 1   OXYGEN, 3lpm with sleep and 2.5lpm with exertion AHC, Disp: , Rfl:    sodium chloride (OCEAN) 0.65 % nasal spray, Place 1 spray into the nose as needed. For dryness, Disp: , Rfl:    zolpidem (AMBIEN) 10 MG tablet, Take 1 tablet (10 mg total) by mouth at bedtime as needed for sleep., Disp: 30 tablet, Rfl: 2  Current Facility-Administered Medications:    cyanocobalamin ((VITAMIN B-12)) injection 1,000 mcg, 1,000 mcg, Intramuscular, Q30 days, Blanch Media, Akayla Brass F, FNP, 1,000 mcg at 06/20/19 1412  Allergies  Allergen Reactions   Belsomra [Suvorexant]     Hard to breathe, hot and sweats within 20 min of taking    Temazepam    Pravastatin Rash    Elevated heart rate per patient   Past Medical History:  Diagnosis Date   AAA (  abdominal aortic aneurysm) ([place])    Needs [place] on/after 06/25/2021. 28 mm   Abnormal CT scan    SPN right mid zone. first detected 08/16/08; see CT [facility]    Atrial flutter ([place])    a. echo 09/28/11 EF 50-55%, mild RAE, RV mildly dilated, systolic fxn mild-mod reduced;  b. 01/2012 s/p EPS & RFCA.   Chronic back pain    Chronic diastolic CHF (congestive heart failure) (HCC)    Claustrophobia    COPD (chronic obstructive pulmonary disease) ([place])    Wert. PFTs 10/09/08 FEV1 1.39 (44%) ratio 40, DLCO 63%. 16% improvement after bronchodilator. overnight pulse ox 02/28/09 5:39m pent with sat< 89 sleeping > declined further o2 07/05/09. HFA 75% 04/23/09.    Dyslipidemia 03/03/2016   GERD (gastroesophageal reflux  disease)    Hypertension    Lung nodule    Prostate enlargement    Sleep apnea    Stop Bang score of 5. Pt has had sleep study and was told he didn't have Sleep Apnea.   Vitamin B12 deficiency 02/01/2018    Observations/Objective: A&O  No respiratory distress or wheezing audible over the phone Mood, judgement, and thought processes all WNL  Assessment and Plan: 1. Difficulty sleeping - Patient tried very hard for me to find an alternative to help him sleep.  Since we have been unsuccessful and I understand him just wanting to have a good quality of life as much as he can due to his chronic pain I am giving him back his Ambien 10 mg at bedtime. - zolpidem (AMBIEN) 10 MG tablet; Take 1 tablet (10 mg total) by mouth at bedtime as needed for sleep.  Dispense: 30 tablet; Refill: 2   Follow Up Instructions:  I discussed the assessment and treatment plan with the patient. The patient was provided an opportunity to ask questions and all were answered. The patient agreed with the plan and demonstrated an understanding of the instructions.   The patient was advised to call back or seek an in-person evaluation if the symptoms worsen or if the condition fails to improve as anticipated.  The above assessment and management plan was discussed with the patient. The patient verbalized understanding of and has agreed to the management plan. Patient is aware to call the clinic if symptoms persist or worsen. Patient is aware when to return to the clinic for a follow-up visit. Patient educated on when it is appropriate to go to the emergency department.   Time call ended: 8:58 AM  I provided 19 minutes of non-face-to-face time during this encounter.  Hendricks Limes, MSN, APRN, FNP-C [facility] 07/07/19

## 2019-07-12 ENCOUNTER — Telehealth: Payer: Self-pay | Admitting: Family Medicine

## 2019-07-13 ENCOUNTER — Ambulatory Visit (INDEPENDENT_AMBULATORY_CARE_PROVIDER_SITE_OTHER): Payer: Medicare Other

## 2019-07-13 ENCOUNTER — Encounter: Payer: Self-pay | Admitting: Family Medicine

## 2019-07-13 ENCOUNTER — Ambulatory Visit (INDEPENDENT_AMBULATORY_CARE_PROVIDER_SITE_OTHER): Payer: Medicare Other | Admitting: Family Medicine

## 2019-07-13 ENCOUNTER — Other Ambulatory Visit: Payer: Self-pay

## 2019-07-13 VITALS — BP 146/78 | HR 95 | Temp 98.0°F | Resp 20 | Ht 67.0 in | Wt 198.0 lb

## 2019-07-13 DIAGNOSIS — G8929 Other chronic pain: Secondary | ICD-10-CM

## 2019-07-13 DIAGNOSIS — M25552 Pain in left hip: Secondary | ICD-10-CM

## 2019-07-13 DIAGNOSIS — M5432 Sciatica, left side: Secondary | ICD-10-CM

## 2019-07-13 DIAGNOSIS — J9611 Chronic respiratory failure with hypoxia: Secondary | ICD-10-CM | POA: Diagnosis not present

## 2019-07-13 DIAGNOSIS — J9612 Chronic respiratory failure with hypercapnia: Secondary | ICD-10-CM

## 2019-07-13 DIAGNOSIS — J449 Chronic obstructive pulmonary disease, unspecified: Secondary | ICD-10-CM

## 2019-07-13 MED ORDER — GABAPENTIN 300 MG PO CAPS: ORAL_CAPSULE | ORAL | 0 refills | Status: DC

## 2019-07-13 NOTE — Telephone Encounter (Signed)
Attempted to contact patient - NA °

## 2019-07-13 NOTE — Progress Notes (Signed)
Chief Complaint  Patient presents with  . Hip Pain    Left. No injury    HPI  Patient presents today for severe pain - 8.5/10 at posterior left hip. Radiates down posterolateral left thigh. Cannot walk on it due to severity of pain. Has had 3 operations by Dr. Maureen Ralphs for it. No injury known. Pain is chronic with acute exacerbation. No relief with recent prednisone pack.  PT. Wear oxygen, 2.5L nasal cannula during the day and 3L at night due to COPD and a. Fib.[name] He uses it around the clock. It benefits him by allowing him to walk. With his O2 he can go about 40  Feet. Without it he can't walk at all. He can't lay flat at all due to dyspnea. HE requires the oxygen even though he also uses Breo inhaler   PMH: Smoking status noted ROS: Per HPI Review of Systems  Constitutional: Positive for activity change and fatigue. Negative for fever.  HENT: Negative.   Eyes: Negative for visual disturbance.  Respiratory: Positive for cough, chest tightness and shortness of breath.   Cardiovascular: Negative for chest pain and leg swelling.  Gastrointestinal: Negative for abdominal pain, diarrhea, nausea and vomiting.  Genitourinary: Negative for difficulty urinating.  Musculoskeletal: Positive for arthralgias and myalgias.  Skin: Negative for rash.  Neurological: Negative for headaches.  Psychiatric/Behavioral: Negative for sleep disturbance.    Objective: BP (!) 146/78   Pulse 95   Temp 98 F (36.7 C) (Temporal)   Resp 20   Ht 5\' 7"  (1.702 m) Comment: In wheelchair  Wt 198 lb (89.8 kg) Comment: In wheelchair  SpO2 94%   BMI 31.01 kg/m  Gen: NAD, alert, cooperative with exam. Pt. Is in his chronic stable state.  HEENT: NCAT, EOMI, PERRL CV: RRR, good S1/S2, no murmur Resp: Wide spread wheezes, with decreased expiratory phase Abd: SNTND, BS present, no guarding or organomegaly Ext: No edema, warm. Wheelchair bound. Tender at left sciatic notch. Tender ROM for Left hip Neuro: Alert and  oriented, No gross deficits  Assessment and plan:  1. Chronic respiratory failure with hypoxia and hypercapnia (HCC)   2. Chronic hip pain, left   3. Sciatica of left side   4. COPD GOLD III with reversible component      Meds ordered this encounter  Medications  . gabapentin (NEURONTIN) 300 MG capsule    Sig: 1 at bedtime for1 week then 2  The next  week then 3 the next week then 4 daily.    Dispense:  120 capsule    Refill:  0    Orders Placed This Encounter  Procedures  . DG HIP UNILAT WITH PELVIS 2-3 VIEWS LEFT    Standing Status:   Future    Number of Occurrences:   1    Standing Expiration Date:   09/10/2020    Order Specific Question:   Reason for Exam (SYMPTOM  OR DIAGNOSIS REQUIRED)    Answer:   pain and swelling at left hip (points to buttock) goes down posterior thigh    Order Specific Question:   Preferred imaging location?    Answer:   Internal    Follow up as needed.  Claretta Fraise, MD

## 2019-07-13 NOTE — Telephone Encounter (Signed)
Apt scheduled with [facility]

## 2019-07-18 NOTE — Telephone Encounter (Signed)
eRROR 

## 2019-07-25 ENCOUNTER — Ambulatory Visit: Payer: Medicare Other

## 2019-08-08 ENCOUNTER — Ambulatory Visit (INDEPENDENT_AMBULATORY_CARE_PROVIDER_SITE_OTHER): Payer: Medicare Other | Admitting: Orthopaedic Surgery

## 2019-08-08 ENCOUNTER — Other Ambulatory Visit: Payer: Self-pay

## 2019-08-08 ENCOUNTER — Encounter: Payer: Self-pay | Admitting: Orthopaedic Surgery

## 2019-08-08 ENCOUNTER — Ambulatory Visit (INDEPENDENT_AMBULATORY_CARE_PROVIDER_SITE_OTHER): Payer: Medicare Other

## 2019-08-08 DIAGNOSIS — M545 Low back pain: Secondary | ICD-10-CM

## 2019-08-08 DIAGNOSIS — G8929 Other chronic pain: Secondary | ICD-10-CM

## 2019-08-08 MED ORDER — LIDOCAINE 5 % EX PTCH: 1.0000 | MEDICATED_PATCH | CUTANEOUS | 0 refills | Status: DC

## 2019-08-08 NOTE — Progress Notes (Signed)
Office Visit Note   Patient: Francisco Tanner           Date of Birth: [birth year]           MRN: [id number] Visit Date: 08/08/2019              Requested by: Loman Brooklyn, [place],  [place] [id number] PCP: Loman Brooklyn, FNP   Assessment & Plan: Visit Diagnoses:  1. Chronic bilateral low back pain, unspecified whether sciatica present     Plan: I went over his x-rays in detail.  I am at a loss of what else I could recommend for him.  We will obtain a three-phase bone scan to rule out any type of stress fracture around his spine, pelvis or hip as well as rule out prosthetic loosening.  I have recommended Lidoderm patches as well as a topical anti-inflammatory Voltaren gel which is only when he can try and has not.  He can try this 2-3 times a day on the areas of maximal tenderness.  He conveyed an understanding of this and will try.  We will see him back after three-phase bone scan.  Follow-Up Instructions: No follow-ups on file.   Orders:  Orders Placed This Encounter  Procedures  . XR Lumbar Spine 2-3 Views   Meds ordered this encounter  Medications  . lidocaine (LIDODERM) 5 %    Sig: Place 1 patch onto the skin daily. Remove & Discard patch within 12 hours or as directed by MD    Dispense:  30 patch    Refill:  0      Procedures: No procedures performed   Clinical Data: No additional findings.   Subjective: Chief Complaint  Patient presents with  . Left Hip - Pain  The patient comes in today for evaluation treatment of left hip and low back pain.  He has a complex medical history and the fact that he had a left total hip arthroplasty done by one of my colleagues in town in 2010.  He then had a dislocation sometime after that.  He also has osteopenic bone.  He has had multiple compression fractures on his lumbar spine over the years and has multiple vertebroplasty's.  He has had multiple epidural steroid injections on his back and injections around  his hip and pelvis on the left side.  None of these have helped at all.  He is on chronic oxygen and cannot lay flat.  He is on Eliquis so he cannot take anti-inflammatories.  He has been through physical therapy.  He is also on hydrocodone 4 times a day for the last 3 years and that has done off his pain centers.  They also tried Neurontin starting in December of this past year but I do not think that is helped him either according to the patient.  HPI  Review of Systems He currently denies any acute changes in medical status in terms of chest pain, headache, fever, chills, nausea, vomiting  Objective: Vital Signs: There were no vitals taken for this visit.  Physical Exam He is alert and oriented in no acute distress but obvious discomfort Ortho Exam Examination of his left hip to even just light touch around the trochanteric area and his posterior pelvis as well as his low back shows severe pain out of proportion of exam.  His hip is clinically well located. Specialty Comments:  No specialty comments available.  Imaging: XR Lumbar Spine 2-3 Views  Result  Date: 08/08/2019 2 views of the lumbar spine show no acute findings.  There is evidence of vertebroplasty is at several areas.  There is a subacute/chronic compression fracture at L4 with about 25% loss of height.  X-rays independently reviewed of the pelvis and left hip from December of this past year which is only a month ago showed a well-seated total hip arthroplasty with no complicating features.  PMFS History: Patient Active Problem List   Diagnosis Date Noted  . AAA (abdominal aortic aneurysm) without rupture ([place]) 04/20/2019  . Vitamin B12 deficiency 02/01/2018  . Chronic respiratory failure with hypoxia and hypercapnia ([place]) 01/30/2017  . Gastroesophageal reflux disease without esophagitis 06/16/2016  . Dyslipidemia 03/03/2016  . Back pain, chronic 06/12/2014  . Hypertension 01/28/2012  . History of pulmonary embolism  01/28/2012  . Anticoagulant long-term use 09/22/2011  . Morbid obesity due to excess calories ([place]) 07/01/2011  . Chronic diastolic heart failure ([place]) 02/19/2011  . Atrial flutter ([place]) 09/08/2008  . COPD GOLD III with reversible component  09/08/2008   Past Medical History:  Diagnosis Date  . AAA (abdominal aortic aneurysm) ([place])    Needs [place] on/after 06/25/2021. 28 mm  . Abnormal CT scan    SPN right mid zone. first detected 08/16/08; see CT [facility]   . Atrial flutter ([place])    a. echo 09/28/11 EF 50-55%, mild RAE, RV mildly dilated, systolic fxn mild-mod reduced;  b. 01/2012 s/p EPS & RFCA.  [name] Chronic back pain   . Chronic diastolic CHF (congestive heart failure) ([place])   . Claustrophobia   . COPD (chronic obstructive pulmonary disease) ([place])    Wert. PFTs 10/09/08 FEV1 1.39 (44%) ratio 40, DLCO 63%. 16% improvement after bronchodilator. overnight pulse ox 02/28/09 5:19m pent with sat< 89 sleeping > declined further o2 07/05/09. HFA 75% 04/23/09.   [name] Dyslipidemia 03/03/2016  . GERD (gastroesophageal reflux disease)   . Hypertension   . Lung nodule   . Prostate enlargement   . Sleep apnea    Stop Bang score of 5. Pt has had sleep study and was told he didn't have Sleep Apnea.  . Vitamin B12 deficiency 02/01/2018    Family History  Problem Relation Age of Onset  . Diabetes Father   . Emphysema Paternal Aunt        smoked and worked in a [employer]  . Cancer Neg Hx     Past Surgical History:  Procedure Laterality Date  . A FLUTTER ABLATION N/A 02/02/2012   Procedure: ABLATION A FLUTTER;  Surgeon: Deboraha Sprang, MD;  Location: [facility] CATH LAB;  Service: Cardiovascular;  Laterality: N/A;  . BACK SURGERY     x4  . CARDIOVERSION     in past  . CATARACT EXTRACTION W/PHACO Right 09/04/2014   Procedure: CATARACT EXTRACTION PHACO AND INTRAOCULAR LENS PLACEMENT RIGHT EYE CDE=12.15;  Surgeon: Tonny Branch, MD;  Location: [facility] ORS;  Service: Ophthalmology;  Laterality: Right;  . hip replacement surgery      L   . INCISION AND DRAINAGE HIP  09/24/2011   Procedure: IRRIGATION AND DEBRIDEMENT HIP;  Surgeon: Gearlean Alf, MD;  Location: [facility] ORS;  Service: Orthopedics;  Laterality: Left;  . KNEE SURGERY Left   . KYPHOSIS SURGERY     Social History   Occupational History  . Not on file  Tobacco Use  . Smoking status: Former Smoker    Packs/day: 0.50    Years: 20.00    Pack years: 10.00  Types: Cigarettes    Quit date: 07/28/2002    Years since quitting: 17.0  . Smokeless tobacco: Never Used  Substance and Sexual Activity  . Alcohol use: Not Currently    Comment: occasionally at a social gathering  . Drug use: No  . Sexual activity: Not Currently

## 2019-08-09 ENCOUNTER — Other Ambulatory Visit: Payer: Self-pay

## 2019-08-09 DIAGNOSIS — Z96642 Presence of left artificial hip joint: Secondary | ICD-10-CM

## 2019-08-12 ENCOUNTER — Telehealth: Payer: Self-pay | Admitting: Family Medicine

## 2019-08-12 NOTE — Telephone Encounter (Signed)
TC to [facility], they have documentation they need They will call patient to see what supplies he needs

## 2019-08-24 ENCOUNTER — Ambulatory Visit (HOSPITAL_COMMUNITY): Payer: Medicare Other

## 2019-08-24 ENCOUNTER — Other Ambulatory Visit (HOSPITAL_COMMUNITY): Payer: Medicare Other

## 2019-08-24 NOTE — Progress Notes (Signed)
Pt scheduled for televisit [phone] 3 mo checkup

## 2019-09-12 ENCOUNTER — Other Ambulatory Visit: Payer: Self-pay | Admitting: Family Medicine

## 2019-09-12 DIAGNOSIS — G479 Sleep disorder, unspecified: Secondary | ICD-10-CM

## 2019-10-21 ENCOUNTER — Encounter: Payer: Self-pay | Admitting: Family Medicine

## 2019-10-21 ENCOUNTER — Ambulatory Visit (INDEPENDENT_AMBULATORY_CARE_PROVIDER_SITE_OTHER): Payer: Medicare Other | Admitting: Family Medicine

## 2019-10-21 VITALS — BP 158/78 | HR 76

## 2019-10-21 DIAGNOSIS — J9611 Chronic respiratory failure with hypoxia: Secondary | ICD-10-CM | POA: Diagnosis not present

## 2019-10-21 DIAGNOSIS — M545 Low back pain, unspecified: Secondary | ICD-10-CM

## 2019-10-21 DIAGNOSIS — I714 Abdominal aortic aneurysm, without rupture, unspecified: Secondary | ICD-10-CM

## 2019-10-21 DIAGNOSIS — J449 Chronic obstructive pulmonary disease, unspecified: Secondary | ICD-10-CM

## 2019-10-21 DIAGNOSIS — I4821 Permanent atrial fibrillation: Secondary | ICD-10-CM

## 2019-10-21 DIAGNOSIS — E538 Deficiency of other specified B group vitamins: Secondary | ICD-10-CM

## 2019-10-21 DIAGNOSIS — K219 Gastro-esophageal reflux disease without esophagitis: Secondary | ICD-10-CM

## 2019-10-21 DIAGNOSIS — M25552 Pain in left hip: Secondary | ICD-10-CM

## 2019-10-21 DIAGNOSIS — J9612 Chronic respiratory failure with hypercapnia: Secondary | ICD-10-CM

## 2019-10-21 DIAGNOSIS — I5032 Chronic diastolic (congestive) heart failure: Secondary | ICD-10-CM

## 2019-10-21 DIAGNOSIS — G8929 Other chronic pain: Secondary | ICD-10-CM

## 2019-10-21 MED ORDER — OMEPRAZOLE 20 MG PO CPDR: 20.0000 mg | DELAYED_RELEASE_CAPSULE | Freq: Every day | ORAL | 1 refills | Status: DC

## 2019-10-21 NOTE — Progress Notes (Signed)
Virtual Visit via Telephone Note  I connected with Arlyce Dice on 10/21/19 at 1:21 PM by telephone and verified that I am speaking with the correct person using two identifiers. Eleftherios Blystone is currently located at home and nobody is currently with him during this visit. The provider, Loman Brooklyn, FNP is located in their office at time of visit.  I discussed the limitations, risks, security and privacy concerns of performing an evaluation and management service by telephone and the availability of in person appointments. I also discussed with the patient that there may be a patient responsible charge related to this service. The patient expressed understanding and agreed to proceed.  Subjective: PCP: Loman Brooklyn, FNP  Chief Complaint  Patient presents with  . Medical Management of Chronic Issues   Patient reports he is most bothered by his back and left hip pain. He saw Dr. Livia Snellen in December who gave him a trial of gabapentin which the patient did not feel helped at all. He saw Dr. Ninfa Linden (orthopedic) in January who ordered a bone scan to rule out any type of stress fracture around his spine, pelvis, or hip as well as rule out prosthetic loosening. Patient reports he was unable to complete this, as he cannot lie flat and he was told he could not have the scan without lying flat. Patient has had a ramp built at his home but reports he still has a hard time walking around. He is afraid he is going to have to leave his home and go into a nursing home if things get much worse.   Patient reports he is having a hard time getting oxygen supplies from [facility]. He states every time he tries to get supplies they tell him that his account is on hold until they get additional information from [facility]. I do see documentation of this in January but it was documented that they had what they needed. I explained this to the patient and he states they last told him this on 10/04/2019.    ROS: Per  HPI  Current Outpatient Medications:  .  ELIQUIS 5 MG TABS tablet, TAKE ONE TABLET BY MOUTH TWICE DAILY, Disp: 60 tablet, Rfl: 10 .  fluticasone furoate-vilanterol (BREO ELLIPTA) 200-25 MCG/INH AEPB, USE 1 INHALATION DAILY, Disp: , Rfl:  .  gabapentin (NEURONTIN) 300 MG capsule, 1 at bedtime for1 week then 2  The next  week then 3 the next week then 4 daily., Disp: 120 capsule, Rfl: 0 .  HYDROcodone-acetaminophen (NORCO) 10-325 MG tablet, Take 1 tablet by mouth every 6 (six) hours as needed., Disp: , Rfl:  .  lidocaine (LIDODERM) 5 %, Place 1 patch onto the skin daily. Remove & Discard patch within 12 hours or as directed by MD, Disp: 30 patch, Rfl: 0 .  metoprolol succinate (TOPROL-XL) 25 MG 24 hr tablet, TAKE ONE (1) TABLET EACH DAY, Disp: 90 tablet, Rfl: 3 .  omeprazole (PRILOSEC) 20 MG capsule, Take 1 capsule (20 mg total) by mouth daily., Disp: 90 capsule, Rfl: 1 .  OXYGEN, 3lpm with sleep and 2.5lpm with exertion AHC, Disp: , Rfl:  .  sodium chloride (OCEAN) 0.65 % nasal spray, Place 1 spray into the nose as needed. For dryness, Disp: , Rfl:  .  zolpidem (AMBIEN) 10 MG tablet, TAKE 1 TABLET AT BEDTIME AS NEEDED FOR SLEEP, Disp: 30 tablet, Rfl: 5  Current Facility-Administered Medications:  .  cyanocobalamin ((VITAMIN B-12)) injection 1,000 mcg, 1,000 mcg, Intramuscular, Q30 days,  Hendricks Limes F, FNP, 1,000 mcg at 06/20/19 1412  Allergies  Allergen Reactions  . Belsomra [Suvorexant]     Hard to breathe, hot and sweats within 20 min of taking   . Temazepam   . Pravastatin Rash    Elevated heart rate per patient   Past Medical History:  Diagnosis Date  . AAA (abdominal aortic aneurysm) ([place])    Needs [place] on/after 06/25/2021. 28 mm  . Abnormal CT scan    SPN right mid zone. first detected 08/16/08; see CT [facility]   . Atrial flutter ([place])    a. echo 09/28/11 EF 50-55%, mild RAE, RV mildly dilated, systolic fxn mild-mod reduced;  b. 01/2012 s/p EPS & RFCA.  [name] Chronic back pain   .  Chronic diastolic CHF (congestive heart failure) ([place])   . Claustrophobia   . COPD (chronic obstructive pulmonary disease) ([place])    Wert. PFTs 10/09/08 FEV1 1.39 (44%) ratio 40, DLCO 63%. 16% improvement after bronchodilator. overnight pulse ox 02/28/09 5:53m pent with sat< 89 sleeping > declined further o2 07/05/09. HFA 75% 04/23/09.   [name] Dyslipidemia 03/03/2016  . GERD (gastroesophageal reflux disease)   . Hypertension   . Lung nodule   . Prostate enlargement   . Sleep apnea    Stop Bang score of 5. Pt has had sleep study and was told he didn't have Sleep Apnea.  . Vitamin B12 deficiency 02/01/2018    Observations/Objective: A&O  No respiratory distress or wheezing audible over the phone Mood, judgement, and thought processes all WNL  Assessment and Plan: 1-2. Chronic respiratory failure with hypoxia and hypercapnia (HCC)/COPD GOLD III with reversible component  - I have called [facility] to inquire about documentation needs. They are in need of re-certification for oxygen and an updated sleep study due to COPD and sleep apnea. I was told patient currently has a balance of $3390 and that if they did not get documentation he would be charged. We will get him scheduled to come in. She asked that we call back and let them know when his appointment is. I have also done this.   3. Chronic midline low back pain, unspecified whether sciatica present - Managed by Dr. Nelva Bush. Continue current plan of care.   4. Chronic hip pain, left - Patient has chronic pain medications which he will continue. He is unable to lie flat to get the bone scan that was ordered by Dr. Ninfa Linden.   5. Gastroesophageal reflux disease without esophagitis - Well controlled on current regimen.  - omeprazole (PRILOSEC) 20 MG capsule; Take 1 capsule (20 mg total) by mouth daily.  Dispense: 90 capsule; Refill: 1  6. Vitamin B12 deficiency - Patient is in need of B12 injection. We can give this when he comes in next month.    7. Chronic diastolic HF (heart failure) ([place]) - Managed by cardiology.   8. Abdominal aortic aneurysm (AAA) without rupture ([place]) - Managed by cardiology. Unable to tolerate any more scans. Conservative management.   9. Permanent atrial fibrillation ([place]) - Managed by cardiology.    Follow Up Instructions: I discussed the assessment and treatment plan with the patient. The patient was provided an opportunity to ask questions and all were answered. The patient agreed with the plan and demonstrated an understanding of the instructions.   The patient was advised to call back or seek an in-person evaluation if the symptoms worsen or if the condition fails to improve as anticipated.  The above assessment and management  plan was discussed with the patient. The patient verbalized understanding of and has agreed to the management plan. Patient is aware to call the clinic if symptoms persist or worsen. Patient is aware when to return to the clinic for a follow-up visit. Patient educated on when it is appropriate to go to the emergency department.   Time call ended: 1:41 PM  I provided 22 minutes of non-face-to-face time during this encounter.  Hendricks Limes, MSN, APRN, FNP-C [facility] 10/21/19

## 2019-11-09 ENCOUNTER — Other Ambulatory Visit: Payer: Self-pay

## 2019-11-09 ENCOUNTER — Ambulatory Visit (INDEPENDENT_AMBULATORY_CARE_PROVIDER_SITE_OTHER): Payer: Medicare Other | Admitting: Family Medicine

## 2019-11-09 ENCOUNTER — Encounter: Payer: Self-pay | Admitting: Family Medicine

## 2019-11-09 VITALS — BP 163/90 | HR 106 | Temp 98.4°F | Ht 67.0 in | Wt 191.0 lb

## 2019-11-09 DIAGNOSIS — Z7901 Long term (current) use of anticoagulants: Secondary | ICD-10-CM

## 2019-11-09 DIAGNOSIS — J449 Chronic obstructive pulmonary disease, unspecified: Secondary | ICD-10-CM | POA: Diagnosis not present

## 2019-11-09 DIAGNOSIS — J9611 Chronic respiratory failure with hypoxia: Secondary | ICD-10-CM | POA: Diagnosis not present

## 2019-11-09 DIAGNOSIS — E538 Deficiency of other specified B group vitamins: Secondary | ICD-10-CM | POA: Diagnosis not present

## 2019-11-09 DIAGNOSIS — J9612 Chronic respiratory failure with hypercapnia: Secondary | ICD-10-CM

## 2019-11-09 DIAGNOSIS — Z9981 Dependence on supplemental oxygen: Secondary | ICD-10-CM | POA: Diagnosis not present

## 2019-11-09 DIAGNOSIS — I10 Essential (primary) hypertension: Secondary | ICD-10-CM

## 2019-11-09 NOTE — Progress Notes (Signed)
Assessment & Plan:  1-3. COPD GOLD III with reversible component/Chronic respiratory failure with hypoxia and hypercapnia (HCC)/Dependence on supplemental oxygen - On room air, at rest, patient's oxygen saturation dropped down to 81%.  With application of 3L via nasal cannula he came up to 91%.   4. Essential hypertension - Elevated in office but patient reports control at home.  Encouraged him to continue to monitor and let me know if his blood pressure is consistently > 150/90.  - CMP14+EGFR - Lipid panel  5. Vitamin B12 deficiency - Monthly vitamin B12 injection given in office today. - Vitamin B12  6. Anticoagulant long-term use - CBC with Differential/Platelet   Return in about 3 months (around 02/08/2020) for follow-up of chronic medication conditions (telephone).  Hendricks Limes, MSN, APRN, FNP-C [facility]  Subjective:    Patient ID: Francisco Tanner, male    DOB: 1946-06-07, 74 y.o.   MRN: [id number]  Patient Care Team: Loman Brooklyn, FNP as PCP - General (Family Medicine) Minus Breeding, MD as PCP - Cardiology (Cardiology) Luanne Bras, MD (Radiology) Suella Broad, MD (Physical Medicine and Rehabilitation) Tanda Rockers, MD as Consulting Physician (Pulmonary Disease)   Chief Complaint:  Chief Complaint  Patient presents with  . oxy test  . sleep study    HPI: Francisco Tanner is a 74 y.o. male presenting on 11/09/2019 for oxy test and sleep study  Patient is here for oxygen testing to requalify him for home O2.  I was also told by the representative at [facility] that he needed an updated sleep study due to sleep apnea, but the patient does not wear a CPAP so I am not sure why this is needed.  Patient's blood pressure is elevated today but he reports he does keep an eye on it at home and his systolic stays mostly in the 130s.   New complaints: Not a new complaint but patient is hoping to get his B12 shot while he is here  today.  Social history:  Relevant past medical, surgical, family and social history reviewed and updated as indicated. Interim medical history since our last visit reviewed.  Allergies and medications reviewed and updated.  DATA REVIEWED: CHART IN EPIC  ROS: Negative unless specifically indicated above in HPI.    Current Outpatient Medications:  .  ELIQUIS 5 MG TABS tablet, TAKE ONE TABLET BY MOUTH TWICE DAILY, Disp: 60 tablet, Rfl: 10 .  fluticasone furoate-vilanterol (BREO ELLIPTA) 200-25 MCG/INH AEPB, USE 1 INHALATION DAILY, Disp: , Rfl:  .  HYDROcodone-acetaminophen (NORCO) 10-325 MG tablet, Take 1 tablet by mouth every 6 (six) hours as needed., Disp: , Rfl:  .  lidocaine (LIDODERM) 5 %, Place 1 patch onto the skin daily. Remove & Discard patch within 12 hours or as directed by MD, Disp: 30 patch, Rfl: 0 .  metoprolol succinate (TOPROL-XL) 25 MG 24 hr tablet, TAKE ONE (1) TABLET EACH DAY, Disp: 90 tablet, Rfl: 3 .  omeprazole (PRILOSEC) 20 MG capsule, Take 1 capsule (20 mg total) by mouth daily., Disp: 90 capsule, Rfl: 1 .  OXYGEN, 3lpm with sleep and 2.5lpm with exertion AHC, Disp: , Rfl:  .  sodium chloride (OCEAN) 0.65 % nasal spray, Place 1 spray into the nose as needed. For dryness, Disp: , Rfl:  .  zolpidem (AMBIEN) 10 MG tablet, TAKE 1 TABLET AT BEDTIME AS NEEDED FOR SLEEP, Disp: 30 tablet, Rfl: 5  Current Facility-Administered Medications:  .  cyanocobalamin ((VITAMIN B-12)) injection 1,000 mcg,  1,000 mcg, Intramuscular, Q30 days, Loman Brooklyn, FNP, 1,000 mcg at 11/09/19 1512   Allergies  Allergen Reactions  . Belsomra [Suvorexant]     Hard to breathe, hot and sweats within 20 min of taking   . Temazepam   . Pravastatin Rash    Elevated heart rate per patient   Past Medical History:  Diagnosis Date  . AAA (abdominal aortic aneurysm) ([place])    Needs [place] on/after 06/25/2021. 28 mm  . Abnormal CT scan    SPN right mid zone. first detected 08/16/08; see [facility]   .  Atrial flutter ([place])    a. echo 09/28/11 EF 50-55%, mild RAE, RV mildly dilated, systolic fxn mild-mod reduced;  b. 01/2012 s/p EPS & RFCA.  [name] Chronic back pain   . Chronic diastolic CHF (congestive heart failure) ([place])   . Claustrophobia   . COPD (chronic obstructive pulmonary disease) ([place])    Wert. PFTs 10/09/08 FEV1 1.39 (44%) ratio 40, DLCO 63%. 16% improvement after bronchodilator. overnight pulse ox 02/28/09 5:25mpent with sat< 89 sleeping > declined further o2 07/05/09. HFA 75% 04/23/09.   .Marland KitchenDyslipidemia 03/03/2016  . GERD (gastroesophageal reflux disease)   . Hypertension   . Lung nodule   . Prostate enlargement   . Sleep apnea    Stop Bang score of 5. Pt has had sleep study and was told he didn't have Sleep Apnea.  . Vitamin B12 deficiency 02/01/2018    Past Surgical History:  Procedure Laterality Date  . A FLUTTER ABLATION N/A 02/02/2012   Procedure: ABLATION A FLUTTER;  Surgeon: SDeboraha Sprang MD;  [facility];  Service: Cardiovascular;  Laterality: N/A;  . BACK SURGERY     x4  . CARDIOVERSION     in past  . CATARACT EXTRACTION W/PHACO Right 09/04/2014   Procedure: CATARACT EXTRACTION PHACO AND INTRAOCULAR LENS PLACEMENT RIGHT EYE CDE=12.15;  Surgeon: KTonny Branch MD;  Location: [facility];  Service: Ophthalmology;  Laterality: Right;  . hip replacement surgery     L   . INCISION AND DRAINAGE HIP  09/24/2011   Procedure: IRRIGATION AND DEBRIDEMENT HIP;  Surgeon: FGearlean Alf MD;  Location: [facility]S;  Service: Orthopedics;  Laterality: Left;  . KNEE SURGERY Left   . KYPHOSIS SURGERY      Social History   Socioeconomic History  . Marital status: Legally Separated    Spouse name: Not on file  . Number of children: Not on file  . Years of education: Not on file  . Highest education level: Not on file  Occupational History  . Not on file  Tobacco Use  . Smoking status: Former Smoker    Packs/day: 0.50    Years: 20.00    Pack years: 10.00    Types: Cigarettes    Quit  date: 07/28/2002    Years since quitting: 17.3  . Smokeless tobacco: Never Used  Substance and Sexual Activity  . Alcohol use: Not Currently    Comment: occasionally at a social gathering  . Drug use: No  . Sexual activity: Not Currently  Other Topics Concern  . Not on file  Social History Narrative   Separated; children; retired.    Social Determinants of Health   Financial Resource Strain:   . Difficulty of Paying Living Expenses:   Food Insecurity:   . Worried About RCharity fundraiserin the Last Year:   . RHermanin the Last Year:  Transportation Needs:   . Film/video editor (Medical):   [name] Lack of Transportation (Non-Medical):   Physical Activity:   . Days of Exercise per Week:   . Minutes of Exercise per Session:   Stress:   . Feeling of Stress :   Social Connections:   . Frequency of Communication with Friends and Family:   . Frequency of Social Gatherings with Friends and Family:   . Attends Religious Services:   . Active Member of Clubs or Organizations:   . Attends Archivist Meetings:   [name] Marital Status:   Intimate Partner Violence:   . Fear of Current or Ex-Partner:   . Emotionally Abused:   [name] Physically Abused:   . Sexually Abused:         Objective:    BP (!) 163/90   Pulse (!) 106   Temp 98.4 F (36.9 C) (Temporal)   Ht '5\' 7"'  (1.702 m)   Wt 191 lb (86.6 kg)   SpO2 91% Comment: with oxygen  BMI 29.91 kg/m   Wt Readings from Last 3 Encounters:  11/09/19 191 lb (86.6 kg)  07/13/19 198 lb (89.8 kg)  04/21/19 198 lb 1.6 oz (89.9 kg)    Physical Exam Vitals reviewed.  Constitutional:      General: He is not in acute distress.    Appearance: Normal appearance. He is not ill-appearing, toxic-appearing or diaphoretic.  HENT:     Head: Normocephalic and atraumatic.  Eyes:     General: No scleral icterus.       Right eye: No discharge.        Left eye: No discharge.     Conjunctiva/sclera: Conjunctivae normal.   Cardiovascular:     Rate and Rhythm: Normal rate and regular rhythm.     Heart sounds: Normal heart sounds. No murmur. No friction rub. No gallop.   Pulmonary:     Effort: Pulmonary effort is normal. No respiratory distress.     Breath sounds: Normal breath sounds. No stridor. No wheezing, rhonchi or rales.     Comments: Patient does get short of breath very easily.  Wearing oxygen.  Musculoskeletal:        General: Normal range of motion.     Cervical back: Normal range of motion.  Skin:    General: Skin is warm and dry.  Neurological:     Mental Status: He is alert and oriented to person, place, and time. Mental status is at baseline.  Psychiatric:        Mood and Affect: Mood normal.        Behavior: Behavior normal.        Thought Content: Thought content normal.        Judgment: Judgment normal.     Lab Results  Component Value Date   TSH 0.86 01/29/2017   Lab Results  Component Value Date   WBC 7.1 11/09/2019   HGB 13.3 11/09/2019   HCT 40.2 11/09/2019   MCV 91 11/09/2019   PLT 153 11/09/2019   Lab Results  Component Value Date   NA 143 11/09/2019   K 4.5 11/09/2019   CO2 31 (H) 11/09/2019   GLUCOSE 104 (H) 11/09/2019   BUN 11 11/09/2019   CREATININE 0.56 (L) 11/09/2019   BILITOT 0.7 11/09/2019   ALKPHOS 45 11/09/2019   AST 12 11/09/2019   ALT 7 11/09/2019   PROT 6.0 11/09/2019   ALBUMIN 3.7 11/09/2019   CALCIUM 8.9 11/09/2019   ANIONGAP 5  08/31/2014   GFR 143.85 01/29/2017   Lab Results  Component Value Date   CHOL 141 11/09/2019   Lab Results  Component Value Date   HDL 40 11/09/2019   Lab Results  Component Value Date   LDLCALC 91 11/09/2019   Lab Results  Component Value Date   TRIG 44 11/09/2019   Lab Results  Component Value Date   CHOLHDL 3.5 11/09/2019   Lab Results  Component Value Date   HGBA1C (H) 10/28/2009    6.3 (NOTE) The ADA recommends the following therapeutic goal for glycemic control related to Hgb A1c  measurement: Goal of therapy: <6.5 Hgb A1c  Reference: American Diabetes Association: Clinical Practice Recommendations 2010, Diabetes Care, 2010, 33: (Suppl  1).

## 2019-11-10 LAB — CMP14+EGFR
ALT: 7 IU/L (ref 0–44)
AST: 12 IU/L (ref 0–40)
Albumin/Globulin Ratio: 1.6 (ref 1.2–2.2)
Albumin: 3.7 g/dL (ref 3.7–4.7)
Alkaline Phosphatase: 45 IU/L (ref 39–117)
BUN/Creatinine Ratio: 20 (ref 10–24)
BUN: 11 mg/dL (ref 8–27)
Bilirubin Total: 0.7 mg/dL (ref 0.0–1.2)
CO2: 31 mmol/L — ABNORMAL HIGH (ref 20–29)
Calcium: 8.9 mg/dL (ref 8.6–10.2)
Chloride: 101 mmol/L (ref 96–106)
Creatinine, Ser: 0.56 mg/dL — ABNORMAL LOW (ref 0.76–1.27)
GFR calc Af Amer: 119 mL/min/{1.73_m2} (ref >59)
GFR calc non Af Amer: 103 mL/min/{1.73_m2} (ref >59)
Globulin, Total: 2.3 g/dL (ref 1.5–4.5)
Glucose: 104 mg/dL — ABNORMAL HIGH (ref 65–99)
Potassium: 4.5 mmol/L (ref 3.5–5.2)
Sodium: 143 mmol/L (ref 134–144)
Total Protein: 6 g/dL (ref 6.0–8.5)

## 2019-11-10 LAB — CBC WITH DIFFERENTIAL/PLATELET
Basophils Absolute: 0 10*3/uL (ref 0.0–0.2)
Basos: 1 %
EOS (ABSOLUTE): 0 10*3/uL (ref 0.0–0.4)
Eos: 1 %
Hematocrit: 40.2 % (ref 37.5–51.0)
Hemoglobin: 13.3 g/dL (ref 13.0–17.7)
Immature Grans (Abs): 0 10*3/uL (ref 0.0–0.1)
Immature Granulocytes: 0 %
Lymphocytes Absolute: 0.5 10*3/uL — ABNORMAL LOW (ref 0.7–3.1)
Lymphs: 7 %
MCH: 30.2 pg (ref 26.6–33.0)
MCHC: 33.1 g/dL (ref 31.5–35.7)
MCV: 91 fL (ref 79–97)
Monocytes Absolute: 0.5 10*3/uL (ref 0.1–0.9)
Monocytes: 7 %
Neutrophils Absolute: 6 10*3/uL (ref 1.4–7.0)
Neutrophils: 84 %
Platelets: 153 10*3/uL (ref 150–450)
RBC: 4.4 x10E6/uL (ref 4.14–5.80)
RDW: 12.6 % (ref 11.6–15.4)
WBC: 7.1 10*3/uL (ref 3.4–10.8)

## 2019-11-10 LAB — LIPID PANEL
Chol/HDL Ratio: 3.5 ratio (ref 0.0–5.0)
Cholesterol, Total: 141 mg/dL (ref 100–199)
HDL: 40 mg/dL (ref >39)
LDL Chol Calc (NIH): 91 mg/dL (ref 0–99)
Triglycerides: 44 mg/dL (ref 0–149)
VLDL Cholesterol Cal: 10 mg/dL (ref 5–40)

## 2019-11-10 LAB — VITAMIN B12: Vitamin B-12: 607 pg/mL (ref 232–1245)

## 2019-11-13 ENCOUNTER — Encounter: Payer: Self-pay | Admitting: Family Medicine

## 2019-12-09 ENCOUNTER — Other Ambulatory Visit: Payer: Self-pay | Admitting: Cardiology

## 2019-12-15 ENCOUNTER — Ambulatory Visit (INDEPENDENT_AMBULATORY_CARE_PROVIDER_SITE_OTHER): Payer: Medicare Other | Admitting: Family Medicine

## 2019-12-15 DIAGNOSIS — E538 Deficiency of other specified B group vitamins: Secondary | ICD-10-CM

## 2020-01-11 ENCOUNTER — Other Ambulatory Visit: Payer: Self-pay

## 2020-01-11 ENCOUNTER — Telehealth: Payer: Self-pay | Admitting: Cardiology

## 2020-01-11 ENCOUNTER — Other Ambulatory Visit: Payer: Self-pay | Admitting: Cardiology

## 2020-01-11 ENCOUNTER — Other Ambulatory Visit: Payer: Self-pay | Admitting: *Deleted

## 2020-01-11 DIAGNOSIS — I4892 Unspecified atrial flutter: Secondary | ICD-10-CM

## 2020-01-11 MED ORDER — APIXABAN 5 MG PO TABS: 5.0000 mg | ORAL_TABLET | Freq: Two times a day (BID) | ORAL | 10 refills | Status: DC

## 2020-01-11 MED ORDER — METOPROLOL SUCCINATE ER 25 MG PO TB24: ORAL_TABLET | ORAL | 1 refills | Status: DC

## 2020-01-11 NOTE — Telephone Encounter (Signed)
Accessed pt's chart to check on refill request. Reached out to the PharmD and had refill sent to pharmacy will patient was on phone.

## 2020-01-11 NOTE — Addendum Note (Signed)
Addended by: Rollen Sox on: 01/11/2020 03:39 PM   Modules accepted: Orders

## 2020-01-11 NOTE — Telephone Encounter (Signed)
*  STAT* If patient is at the pharmacy, call can be transferred to refill team.   1. Which medications need to be refilled? (please list name of each medication and dose if known) ELIQUIS 5 MG TABS tablet  2. Which pharmacy/location (including street and city if local pharmacy) is medication to be sent to? [place], [place]  3. Do they need a 30 day or 90 day supply? 30 day

## 2020-01-11 NOTE — Telephone Encounter (Signed)
Prescription refill request for Eliquis received. Indication: Hx of PE, atrial flutter Last office visit: 04/01/19 Scr: 0.56 Age: 74 Weight: 191#

## 2020-03-09 ENCOUNTER — Other Ambulatory Visit: Payer: Self-pay | Admitting: Family Medicine

## 2020-03-09 DIAGNOSIS — G479 Sleep disorder, unspecified: Secondary | ICD-10-CM

## 2020-04-09 ENCOUNTER — Other Ambulatory Visit: Payer: Self-pay | Admitting: Family Medicine

## 2020-04-09 DIAGNOSIS — G479 Sleep disorder, unspecified: Secondary | ICD-10-CM

## 2020-04-12 ENCOUNTER — Ambulatory Visit (INDEPENDENT_AMBULATORY_CARE_PROVIDER_SITE_OTHER): Payer: Medicare Other | Admitting: Family Medicine

## 2020-04-12 VITALS — BP 137/71 | HR 68 | Wt 189.0 lb

## 2020-04-12 DIAGNOSIS — E538 Deficiency of other specified B group vitamins: Secondary | ICD-10-CM

## 2020-04-12 DIAGNOSIS — G479 Sleep disorder, unspecified: Secondary | ICD-10-CM

## 2020-04-12 MED ORDER — ZOLPIDEM TARTRATE 10 MG PO TABS: 10.0000 mg | ORAL_TABLET | Freq: Every day | ORAL | 5 refills | Status: DC

## 2020-04-12 NOTE — Progress Notes (Signed)
Virtual Visit via Telephone Note  I connected with Francisco Tanner on 04/12/20 at 2:28 PM by telephone and verified that I am speaking with the correct person using two identifiers. Francisco Tanner is currently located at home and nobody is currently with him during this visit. The provider, Loman Brooklyn, FNP is located in their office at time of visit.  I discussed the limitations, risks, security and privacy concerns of performing an evaluation and management service by telephone and the availability of in person appointments. I also discussed with the patient that there may be a patient responsible charge related to this service. The patient expressed understanding and agreed to proceed.  Subjective: PCP: Loman Brooklyn, FNP  Chief Complaint  Patient presents with  . Insomnia   Patient is only in need of a refill of his Ambien, which works well for him.  Patient also mentioned while you are on the phone he has not had his vitamin B12 injection in several months because the last time he came he was told he could no longer be given outside.   ROS: Per HPI  Current Outpatient Medications:  .  apixaban (ELIQUIS) 5 MG TABS tablet, Take 1 tablet (5 mg total) by mouth 2 (two) times daily., Disp: 60 tablet, Rfl: 10 .  fluticasone furoate-vilanterol (BREO ELLIPTA) 200-25 MCG/INH AEPB, USE 1 INHALATION DAILY, Disp: , Rfl:  .  HYDROcodone-acetaminophen (NORCO) 10-325 MG tablet, Take 1 tablet by mouth every 6 (six) hours as needed., Disp: , Rfl:  .  lidocaine (LIDODERM) 5 %, Place 1 patch onto the skin daily. Remove & Discard patch within 12 hours or as directed by MD, Disp: 30 patch, Rfl: 0 .  metoprolol succinate (TOPROL-XL) 25 MG 24 hr tablet, TAKE ONE (1) TABLET EACH DAY, Disp: 90 tablet, Rfl: 1 .  omeprazole (PRILOSEC) 20 MG capsule, Take 1 capsule (20 mg total) by mouth daily., Disp: 90 capsule, Rfl: 1 .  OXYGEN, 3lpm with sleep and 2.5lpm with exertion AHC, Disp: , Rfl:  .  sodium chloride  (OCEAN) 0.65 % nasal spray, Place 1 spray into the nose as needed. For dryness, Disp: , Rfl:  .  zolpidem (AMBIEN) 10 MG tablet, TAKE 1 TABLET AT BEDTIME AS NEEDED FOR SLEEP, Disp: 30 tablet, Rfl: 5  Current Facility-Administered Medications:  .  cyanocobalamin ((VITAMIN B-12)) injection 1,000 mcg, 1,000 mcg, Intramuscular, Q30 days, Hendricks Limes F, FNP, 1,000 mcg at 12/15/19 1205  Allergies  Allergen Reactions  . Belsomra [Suvorexant]     Hard to breathe, hot and sweats within 20 min of taking   . Temazepam   . Pravastatin Rash    Elevated heart rate per patient   Past Medical History:  Diagnosis Date  . AAA (abdominal aortic aneurysm) ([place])    Needs [place] on/after 06/25/2021. 28 mm  . Abnormal CT scan    SPN right mid zone. first detected 08/16/08; see [facility]   . Atrial flutter ([place])    a. echo 09/28/11 EF 50-55%, mild RAE, RV mildly dilated, systolic fxn mild-mod reduced;  b. 01/2012 s/p EPS & RFCA.  [name] Chronic back pain   . Chronic diastolic CHF (congestive heart failure) ([place])   . Claustrophobia   . COPD (chronic obstructive pulmonary disease) ([place])    Wert. PFTs 10/09/08 FEV1 1.39 (44%) ratio 40, DLCO 63%. 16% improvement after bronchodilator. overnight pulse ox 02/28/09 5:44m pent with sat< 89 sleeping > declined further o2 07/05/09. HFA 75% 04/23/09.   [name] Dyslipidemia  03/03/2016  . GERD (gastroesophageal reflux disease)   . Hypertension   . Lung nodule   . Prostate enlargement   . Sleep apnea    Stop Bang score of 5. Pt has had sleep study and was told he didn't have Sleep Apnea.  . Vitamin B12 deficiency 02/01/2018    Observations/Objective: Vitals:   04/12/20 1430  BP: 137/71  Pulse: 68  SpO2: 94%   A&O  No respiratory distress or wheezing audible over the phone Mood, judgement, and thought processes all WNL  Assessment and Plan: 1. Difficulty sleeping - Well controlled on current regimen.  PDMP reviewed with no concerning findings.  Patient tried very hard to find  another medication to help him with sleep, but nothing else works for him.  I chose to continue prescribing Ambien at 10 mg for his quality of life. - zolpidem (AMBIEN) 10 MG tablet; Take 1 tablet (10 mg total) by mouth at bedtime. for sleep  Dispense: 30 tablet; Refill: 5  2. Vitamin B12 deficiency - I will discuss with clinical manager the possibility patient getting his vitamin B12 injections outside as it is very difficult for him to come in due to severe COPD and pain.   Follow Up Instructions:  I discussed the assessment and treatment plan with the patient. The patient was provided an opportunity to ask questions and all were answered. The patient agreed with the plan and demonstrated an understanding of the instructions.   The patient was advised to call back or seek an in-person evaluation if the symptoms worsen or if the condition fails to improve as anticipated.  The above assessment and management plan was discussed with the patient. The patient verbalized understanding of and has agreed to the management plan. Patient is aware to call the clinic if symptoms persist or worsen. Patient is aware when to return to the clinic for a follow-up visit. Patient educated on when it is appropriate to go to the emergency department.   Time call ended: 2:36 PM  I provided 10 minutes of non-face-to-face time during this encounter.  Hendricks Limes, MSN, APRN, FNP-C [facility] 04/12/20

## 2020-04-15 ENCOUNTER — Encounter: Payer: Self-pay | Admitting: Family Medicine

## 2020-04-15 DIAGNOSIS — G479 Sleep disorder, unspecified: Secondary | ICD-10-CM | POA: Insufficient documentation

## 2020-04-24 DIAGNOSIS — Z7189 Other specified counseling: Secondary | ICD-10-CM | POA: Insufficient documentation

## 2020-04-24 NOTE — Progress Notes (Signed)
Cardiology Office Note   Date:  04/25/2020   ID:  Shimshon, Narula Nov 24, 1945, MRN [id number]  PCP:  Loman Brooklyn, FNP  Cardiologist:   Minus Breeding, MD   Chief Complaint  Patient presents with  . Shortness of Breath      History of Present Illness: Francisco Tanner is a 74 y.o. male who presents for follow up of diastolic heart failure.   He had atrial flutter with ablation but went back into atrial flutter.  He has been managed with rate control and anticoagulation.  He had an echo last 2017 with normal LV function and some evidence of elevated pulmonary pressure.   He wears O2 at night and PRN.   He had significant back pain and there was a small chance that this was associated with Xarelto.  We stopped the Xarelto and this helped the back.    Since I last saw him he has lost about 30 pounds.  Did this intentionally by dieting.  He thinks this is helped his breathing a little bit although he still on chronic oxygen.  He denies any chest pressure, neck or arm discomfort.  He has had no new shortness of breath, PND or orthopnea.  He has had no new palpitations, presyncope or syncope.  He gets around slowly because of his severe chronic back pain.  He uses a cane.  Of note he describes occasional tachypalpitations.  He also reports a sensation of feeling like he is sweating inside all the time.  He feels like fluid is dripping down behind his skin.   Past Medical History:  Diagnosis Date  . AAA (abdominal aortic aneurysm) ([place])    Needs [place] on/after 06/25/2021. 28 mm  . Abnormal CT scan    SPN right mid zone. first detected 08/16/08; see [facility]   . Atrial flutter ([place])    a. echo 09/28/11 EF 50-55%, mild RAE, RV mildly dilated, systolic fxn mild-mod reduced;  b. 01/2012 s/p EPS & RFCA.  [name] Chronic back pain   . Chronic diastolic CHF (congestive heart failure) ([place])   . Claustrophobia   . COPD (chronic obstructive pulmonary disease) ([place])    Wert. PFTs 10/09/08 FEV1 1.39 (44%)  ratio 40, DLCO 63%. 16% improvement after bronchodilator. overnight pulse ox 02/28/09 5:77m pent with sat< 89 sleeping > declined further o2 07/05/09. HFA 75% 04/23/09.   [name] Dyslipidemia 03/03/2016  . GERD (gastroesophageal reflux disease)   . Hypertension   . Lung nodule   . Prostate enlargement   . Sleep apnea    Stop Bang score of 5. Pt has had sleep study and was told he didn't have Sleep Apnea.  . Vitamin B12 deficiency 02/01/2018    Past Surgical History:  Procedure Laterality Date  . A FLUTTER ABLATION N/A 02/02/2012   Procedure: ABLATION A FLUTTER;  Surgeon: Deboraha Sprang, MD;  Location: [facility] CATH LAB;  Service: Cardiovascular;  Laterality: N/A;  . BACK SURGERY     x4  . CARDIOVERSION     in past  . CATARACT EXTRACTION W/PHACO Right 09/04/2014   Procedure: CATARACT EXTRACTION PHACO AND INTRAOCULAR LENS PLACEMENT RIGHT EYE CDE=12.15;  Surgeon: Tonny Branch, MD;  Location: [facility];  Service: Ophthalmology;  Laterality: Right;  . hip replacement surgery     L   . INCISION AND DRAINAGE HIP  09/24/2011   Procedure: IRRIGATION AND DEBRIDEMENT HIP;  Surgeon: Gearlean Alf, MD;  Location: [facility];  Service: Orthopedics;  Laterality: Left;  .  KNEE SURGERY Left   . KYPHOSIS SURGERY       Current Outpatient Medications  Medication Sig Dispense Refill  . apixaban (ELIQUIS) 5 MG TABS tablet Take 1 tablet (5 mg total) by mouth 2 (two) times daily. 60 tablet 10  . fluticasone furoate-vilanterol (BREO ELLIPTA) 200-25 MCG/INH AEPB USE 1 INHALATION DAILY    . HYDROcodone-acetaminophen (NORCO) 10-325 MG tablet Take 1 tablet by mouth every 6 (six) hours as needed.    . metoprolol succinate (TOPROL-XL) 25 MG 24 hr tablet TAKE ONE (1) TABLET EACH DAY 90 tablet 1  . omeprazole (PRILOSEC) 20 MG capsule Take 1 capsule (20 mg total) by mouth daily. 90 capsule 1  . sodium chloride (OCEAN) 0.65 % nasal spray Place 1 spray into the nose as needed. For dryness    . VENTOLIN HFA 108 (90 Base) MCG/ACT inhaler Inhale 2  puffs into the lungs every 6 (six) hours as needed.    . zolpidem (AMBIEN) 10 MG tablet Take 1 tablet (10 mg total) by mouth at bedtime. for sleep 30 tablet 5  . metoprolol tartrate (LOPRESSOR) 25 MG tablet Take 1 tablet (25 mg total) by mouth as needed. 30 tablet 3  . OXYGEN 3lpm with sleep and 2.5lpm with exertion AHC     Current Facility-Administered Medications  Medication Dose Route Frequency Provider Last Rate Last Admin  . cyanocobalamin ((VITAMIN B-12)) injection 1,000 mcg  1,000 mcg Intramuscular Q30 days Loman Brooklyn, FNP   1,000 mcg at 12/15/19 1205    Allergies:   Belsomra [suvorexant], Temazepam, and Pravastatin    ROS:  Please see the history of present illness.   Otherwise, review of systems are positive for none.   All other systems are reviewed and negative.    PHYSICAL EXAM: VS:  BP (!) 142/72   Pulse (!) 102   Ht 5' 7.6" (1.717 m)   Wt 189 lb (85.7 kg)   BMI 29.08 kg/m  , BMI Body mass index is 29.08 kg/m. GENERAL:  Well appearing NECK:  No jugular venous distention, waveform within normal limits, carotid upstroke brisk and symmetric, no bruits, no thyromegaly LUNGS:  Clear to auscultation bilaterally CHEST:  Unremarkable HEART:  PMI not displaced or sustained,S1 and S2 within normal limits, no S3, no clicks, no rubs, no murmurs, irregular  ABD:  Flat, positive bowel sounds normal in frequency in pitch, no bruits, no rebound, no guarding, no midline pulsatile mass, no hepatomegaly, no splenomegaly EXT:  2 plus pulses throughout, moderate ankle edema, no cyanosis no clubbing   EKG:  EKG is ordered today. The ekg ordered today demonstrates atrial fibrillation, right bundle branch block, premature ectopic complexes   Recent Labs: 11/09/2019: ALT 7; BUN 11; Creatinine, Ser 0.56; Hemoglobin 13.3; Platelets 153; Potassium 4.5; Sodium 143    Lipid Panel    Component Value Date/Time   CHOL 141 11/09/2019 1519   TRIG 44 11/09/2019 1519   HDL 40 11/09/2019  1519   CHOLHDL 3.5 11/09/2019 1519   CHOLHDL 4.5 10/28/2009 0545   VLDL 11 10/28/2009 0545   LDLCALC 91 11/09/2019 1519      Wt Readings from Last 3 Encounters:  04/25/20 189 lb (85.7 kg)  04/12/20 189 lb (85.7 kg)  11/09/19 191 lb (86.6 kg)      Other studies Reviewed: Additional studies/ records that were reviewed today include: None. Review of the above records demonstrates:  Please see elsewhere in the note.     ASSESSMENT AND PLAN:  CHRONIC  DIASTOLIC HF:    The patient seems to be euvolemic.  No change in therapy.  ATRIAL FLUTTER:  Francisco Tanner has a CHA2DS2 - VASc score of 4.  He tolerates anticoagulation and has reasonable rate control.  However, since he has some palpitations and he thinks he is a little more short of breath when this happens I will give him metoprolol 25 mg immediate release to be taken as needed palpitations.  AAA: This was 3.2 x 3.4 in 2018.  This was done at [facility].  I will not order follow-up at this point.  Even lying on the table would be painful for him.  DYSLIPIDEMIA: LDL was 91 with an HDL of 40.  He can continue the meds as listed.  SWEATING INSIDE: The patient describes an odd sensation.  I do not have any clear diagnosis for this but would want to check his TSH and he will come back for this.  COVD EDUCATION:  He has been vaccinated.        Current medicines are reviewed at length with the patient today.  The patient does not have concerns regarding medicines.  The following changes have been made:  As above  Labs/ tests ordered today include:   Orders Placed This Encounter  Procedures  . TSH  . EKG 12-Lead     Disposition:   FU with me in one year.     Signed, Minus Breeding, MD  04/25/2020 1:20 PM    [facility]

## 2020-04-25 ENCOUNTER — Ambulatory Visit (INDEPENDENT_AMBULATORY_CARE_PROVIDER_SITE_OTHER): Payer: Medicare Other | Admitting: Cardiology

## 2020-04-25 ENCOUNTER — Encounter: Payer: Self-pay | Admitting: Cardiology

## 2020-04-25 ENCOUNTER — Other Ambulatory Visit: Payer: Self-pay

## 2020-04-25 VITALS — BP 142/72 | HR 102 | Ht 67.6 in | Wt 189.0 lb

## 2020-04-25 DIAGNOSIS — I714 Abdominal aortic aneurysm, without rupture, unspecified: Secondary | ICD-10-CM

## 2020-04-25 DIAGNOSIS — Z79899 Other long term (current) drug therapy: Secondary | ICD-10-CM

## 2020-04-25 DIAGNOSIS — I5032 Chronic diastolic (congestive) heart failure: Secondary | ICD-10-CM

## 2020-04-25 DIAGNOSIS — E785 Hyperlipidemia, unspecified: Secondary | ICD-10-CM

## 2020-04-25 DIAGNOSIS — Z7189 Other specified counseling: Secondary | ICD-10-CM

## 2020-04-25 DIAGNOSIS — I420 Dilated cardiomyopathy: Secondary | ICD-10-CM | POA: Diagnosis not present

## 2020-04-25 DIAGNOSIS — I483 Typical atrial flutter: Secondary | ICD-10-CM

## 2020-04-25 MED ORDER — METOPROLOL TARTRATE 25 MG PO TABS: 25.0000 mg | ORAL_TABLET | ORAL | 3 refills | Status: DC | PRN

## 2020-04-25 NOTE — Patient Instructions (Signed)
Medication Instructions:  You may take Metoprolol tartrate 25 mg once a day as needed. Continue all other medications as listed.  *If you need a refill on your cardiac medications before your next appointment, please call your pharmacy*  Lab Work: Please have blood work at [facility])  If you have labs (blood work) drawn today and your tests are completely normal, you will receive your results only by: [name] MyChart Message (if you have MyChart) OR . A paper copy in the mail If you have any lab test that is abnormal or we need to change your treatment, we will call you to review the results.  Follow-Up: At [facility], you and your health needs are our priority.  As part of our continuing mission to provide you with exceptional heart care, we have created designated Provider Care Teams.  These Care Teams include your primary Cardiologist (physician) and Advanced Practice Providers (APPs -  Physician Assistants and Nurse Practitioners) who all work together to provide you with the care you need, when you need it.  We recommend signing up for the patient portal called "MyChart".  Sign up information is provided on this After Visit Summary.  MyChart is used to connect with patients for Virtual Visits (Telemedicine).  Patients are able to view lab/test results, encounter notes, upcoming appointments, etc.  Non-urgent messages can be sent to your provider as well.   To learn more about what you can do with MyChart, go to [email or web address].    Your next appointment:   12 month(s)  The format for your next appointment:   In Person  Provider:   Minus Breeding, MD   Thank you for choosing [facility]!!

## 2020-05-14 ENCOUNTER — Ambulatory Visit (INDEPENDENT_AMBULATORY_CARE_PROVIDER_SITE_OTHER): Payer: Medicare Other | Admitting: Nurse Practitioner

## 2020-05-14 ENCOUNTER — Other Ambulatory Visit: Payer: Self-pay

## 2020-05-14 ENCOUNTER — Ambulatory Visit: Payer: Medicare Other | Admitting: Nurse Practitioner

## 2020-05-14 ENCOUNTER — Encounter: Payer: Self-pay | Admitting: Nurse Practitioner

## 2020-05-14 VITALS — BP 139/58 | HR 57 | Temp 98.6°F | Ht 67.0 in

## 2020-05-14 DIAGNOSIS — I10 Essential (primary) hypertension: Secondary | ICD-10-CM | POA: Diagnosis not present

## 2020-05-14 DIAGNOSIS — K219 Gastro-esophageal reflux disease without esophagitis: Secondary | ICD-10-CM

## 2020-05-14 DIAGNOSIS — I483 Typical atrial flutter: Secondary | ICD-10-CM | POA: Diagnosis not present

## 2020-05-14 DIAGNOSIS — Z79899 Other long term (current) drug therapy: Secondary | ICD-10-CM | POA: Diagnosis not present

## 2020-05-14 DIAGNOSIS — Z23 Encounter for immunization: Secondary | ICD-10-CM | POA: Diagnosis not present

## 2020-05-14 DIAGNOSIS — E538 Deficiency of other specified B group vitamins: Secondary | ICD-10-CM | POA: Diagnosis not present

## 2020-05-14 NOTE — Assessment & Plan Note (Signed)
GERD well-controlled on current medication.  Patient current medication Prilosec 20 mg tablet by mouth daily.  No changes necessary.  Advised patient to continue healthy diet. Provided education with printed handouts given.

## 2020-05-14 NOTE — Progress Notes (Signed)
Established Patient Office Visit  Subjective:  Patient ID: Francisco Tanner, male    DOB: 19-Apr-1946  Age: 74 y.o. MRN: [id number]  CC:  Chief Complaint  Patient presents with   Follow-up    feels like he is sweating from the in side. Cadiologisit wants thyroid checked     HPI Francisco Tanner presents for Pt presents for follow up of hypertension. Patient was diagnosed in 01/28/2012.. The patient is tolerating the medication well without side effects. Compliance with treatment has been good; including taking medication as directed , maintains a healthy diet and regular exercise regimen , and following up as directed.   GERD, Follow up:  The patient was last seen for GERD 2 years ago. Changes made since that visit include:none.  He reports good compliance with treatment. He is not having side effects. [name] He is NOT experiencing chest pain, choking on food or cough       -----------------------------------------------------------------------------------------   Past Medical History:  Diagnosis Date   AAA (abdominal aortic aneurysm) ([place])    Needs [place] on/after 06/25/2021. 28 mm   Abnormal CT scan    SPN right mid zone. first detected 08/16/08; see [facility]    Atrial flutter ([place])    a. echo 09/28/11 EF 50-55%, mild RAE, RV mildly dilated, systolic fxn mild-mod reduced;  b. 01/2012 s/p EPS & RFCA.   Chronic back pain    Chronic diastolic CHF (congestive heart failure) (HCC)    Claustrophobia    COPD (chronic obstructive pulmonary disease) ([place])    [place]. PFTs 10/09/08 FEV1 1.39 (44%) ratio 40, DLCO 63%. 16% improvement after bronchodilator. overnight pulse ox 02/28/09 5:43m pent with sat< 89 sleeping > declined further o2 07/05/09. HFA 75% 04/23/09.    Dyslipidemia 03/03/2016   GERD (gastroesophageal reflux disease)    Hypertension    Lung nodule    Prostate enlargement    Sleep apnea    Stop Bang score of 5. Pt has had sleep study and was told he didn't have Sleep Apnea.    Vitamin B12 deficiency 02/01/2018    Past Surgical History:  Procedure Laterality Date   A FLUTTER ABLATION N/A 02/02/2012   Procedure: ABLATION A FLUTTER;  Surgeon: Deboraha Sprang, MD;  Location: [facility] CATH LAB;  Service: Cardiovascular;  Laterality: N/A;   BACK SURGERY     x4   CARDIOVERSION     in past   CATARACT EXTRACTION W/PHACO Right 09/04/2014   Procedure: CATARACT EXTRACTION PHACO AND INTRAOCULAR LENS PLACEMENT RIGHT EYE CDE=12.15;  Surgeon: Tonny Branch, MD;  Location: [facility] ORS;  Service: Ophthalmology;  Laterality: Right;   hip replacement surgery     L    INCISION AND DRAINAGE HIP  09/24/2011   Procedure: IRRIGATION AND DEBRIDEMENT HIP;  Surgeon: Gearlean Alf, MD;  Location: [facility] ORS;  Service: Orthopedics;  Laterality: Left;   KNEE SURGERY Left    KYPHOSIS SURGERY      Family History  Problem Relation Age of Onset   Diabetes Father    Emphysema Paternal Aunt        smoked and worked in a Web designer Neg Hx     Social History   Socioeconomic History   Marital status: Legally Separated    Spouse name: Not on file   Number of children: Not on file   Years of education: Not on file   Highest education level: Not on file  Occupational History   Not on file  Tobacco Use   Smoking status: Former Smoker    Packs/day: 0.50    Years: 20.00    Pack years: 10.00    Types: Cigarettes    Quit date: 07/28/2002    Years since quitting: 17.8   Smokeless tobacco: Never Used  Scientific laboratory technician Use: Never used  Substance and Sexual Activity   Alcohol use: Not Currently    Comment: occasionally at a social gathering   Drug use: No   Sexual activity: Not Currently  Other Topics Concern   Not on file  Social History Narrative   Separated; children; retired.    Social Determinants of Health   Financial Resource Strain:    Difficulty of Paying Living Expenses: Not on file  Food Insecurity:    Worried About Charity fundraiser in the Last Year:  Not on file   [employer] of Food in the Last Year: Not on file  Transportation Needs:    Lack of Transportation (Medical): Not on file   Lack of Transportation (Non-Medical): Not on file  Physical Activity:    Days of Exercise per Week: Not on file   Minutes of Exercise per Session: Not on file  Stress:    Feeling of Stress : Not on file  Social Connections:    Frequency of Communication with Friends and Family: Not on file   Frequency of Social Gatherings with Friends and Family: Not on file   Attends Religious Services: Not on file   Active Member of Clubs or Organizations: Not on file   Attends Archivist Meetings: Not on file   Marital Status: Not on file  Intimate Partner Violence:    Fear of Current or Ex-Partner: Not on file   Emotionally Abused: Not on file   Physically Abused: Not on file   Sexually Abused: Not on file    Outpatient Medications Prior to Visit  Medication Sig Dispense Refill   apixaban (ELIQUIS) 5 MG TABS tablet Take 1 tablet (5 mg total) by mouth 2 (two) times daily. 60 tablet 10   fluticasone furoate-vilanterol (BREO ELLIPTA) 200-25 MCG/INH AEPB USE 1 INHALATION DAILY     HYDROcodone-acetaminophen (NORCO) 10-325 MG tablet Take 1 tablet by mouth every 6 (six) hours as needed.     metoprolol succinate (TOPROL-XL) 25 MG 24 hr tablet TAKE ONE (1) TABLET EACH DAY 90 tablet 1   metoprolol tartrate (LOPRESSOR) 25 MG tablet Take 1 tablet (25 mg total) by mouth as needed. 30 tablet 3   omeprazole (PRILOSEC) 20 MG capsule Take 1 capsule (20 mg total) by mouth daily. 90 capsule 1   OXYGEN 3lpm with sleep and 2.5lpm with exertion A[facility]     sodium chloride (OCEAN) 0.65 % nasal spray Place 1 spray into the nose as needed. For dryness     VENTOLIN HFA 108 (90 Base) MCG/ACT inhaler Inhale 2 puffs into the lungs every 6 (six) hours as needed.     zolpidem (AMBIEN) 10 MG tablet Take 1 tablet (10 mg total) by mouth at bedtime. for sleep 30  tablet 5   Facility-Administered Medications Prior to Visit  Medication Dose Route Frequency Provider Last Rate Last Admin   cyanocobalamin ((VITAMIN B-12)) injection 1,000 mcg  1,000 mcg Intramuscular Q30 days Loman Brooklyn, FNP   1,000 mcg at 05/14/20 1405    Allergies  Allergen Reactions   Belsomra [Suvorexant]     Hard to breathe, hot and sweats within 20 min of taking  Temazepam    Pravastatin Rash    Elevated heart rate per patient    ROS Review of Systems  Musculoskeletal: Positive for arthralgias and back pain.       Wheelchair  Neurological: Negative for light-headedness, numbness and headaches.  All other systems reviewed and are negative.     Objective:    Physical Exam Vitals reviewed.  Eyes:     Conjunctiva/sclera: Conjunctivae normal.  Cardiovascular:     Pulses: Normal pulses.     Heart sounds: Normal heart sounds.  Pulmonary:     Effort: Pulmonary effort is normal.     Breath sounds: Normal breath sounds.  Abdominal:     General: Bowel sounds are normal.  Musculoskeletal:        General: Tenderness present. Normal range of motion.     Comments: Wheelchair  Skin:    General: Skin is warm and dry.  Neurological:     Mental Status: He is alert and oriented to person, place, and time.  Psychiatric:        Mood and Affect: Mood normal.     BP (!) 139/58    Pulse (!) 57    Temp 98.6 F (37 C) (Temporal)    Ht 5\' 7"  (1.702 m)    SpO2 94%    BMI 29.60 kg/m  Wt Readings from Last 3 Encounters:  04/25/20 189 lb (85.7 kg)  04/12/20 189 lb (85.7 kg)  11/09/19 191 lb (86.6 kg)     Health Maintenance Due  Topic Date Due   COVID-19 Vaccine (1) Never done   TETANUS/TDAP  Never done    There are no preventive care reminders to display for this patient.  Lab Results  Component Value Date   TSH 0.86 01/29/2017   Lab Results  Component Value Date   WBC 7.1 11/09/2019   HGB 13.3 11/09/2019   HCT 40.2 11/09/2019   MCV 91 11/09/2019    PLT 153 11/09/2019   Lab Results  Component Value Date   NA 143 11/09/2019   K 4.5 11/09/2019   CO2 31 (H) 11/09/2019   GLUCOSE 104 (H) 11/09/2019   BUN 11 11/09/2019   CREATININE 0.56 (L) 11/09/2019   BILITOT 0.7 11/09/2019   ALKPHOS 45 11/09/2019   AST 12 11/09/2019   ALT 7 11/09/2019   PROT 6.0 11/09/2019   ALBUMIN 3.7 11/09/2019   CALCIUM 8.9 11/09/2019   ANIONGAP 5 08/31/2014   GFR 143.85 01/29/2017   Lab Results  Component Value Date   CHOL 141 11/09/2019   Lab Results  Component Value Date   HDL 40 11/09/2019   Lab Results  Component Value Date   LDLCALC 91 11/09/2019   Lab Results  Component Value Date   TRIG 44 11/09/2019   Lab Results  Component Value Date   CHOLHDL 3.5 11/09/2019   Lab Results  Component Value Date   HGBA1C (H) 10/28/2009    6.3 (NOTE) The ADA recommends the following therapeutic goal for glycemic control related to Hgb A1c measurement: Goal of therapy: <6.5 Hgb A1c  Reference: American Diabetes Association: Clinical Practice Recommendations 2010, Diabetes Care, 2010, 33: (Suppl  1).      Assessment & Plan:   Problem List Items Addressed This Visit      Cardiovascular and Mediastinum   Atrial flutter ([place])   Hypertension    Patient hypertension well managed on current medication.  Patient is on metoprolol 25 mg tablets daily by mouth, no changes to medication  dose.  Continue low-sodium diet.  Education provided with printed handouts given.  TSH completed per cardiology order.  Labs pending        Digestive   Gastroesophageal reflux disease without esophagitis    GERD well-controlled on current medication.  Patient current medication Prilosec 20 mg tablet by mouth daily.  No changes necessary.  Advised patient to continue healthy diet. Provided education with printed handouts given.       Other Visit Diagnoses    Need for immunization against influenza    -  Primary   Relevant Orders   Flu Vaccine QUAD High Dose(Fluad)  (Completed)   Medication management            Follow-up: Return if symptoms worsen or fail to improve.    Ivy Lynn, NP

## 2020-05-14 NOTE — Patient Instructions (Addendum)
Hypertension, Adult Hypertension is another name for high blood pressure. High blood pressure forces your heart to work harder to pump blood. This can cause problems over time. There are two numbers in a blood pressure reading. There is a top number (systolic) over a bottom number (diastolic). It is best to have a blood pressure that is below 120/80. Healthy choices can help lower your blood pressure, or you may need medicine to help lower it. What are the causes? The cause of this condition is not known. Some conditions may be related to high blood pressure. What increases the risk?  Smoking.  Having type 2 diabetes mellitus, high cholesterol, or both.  Not getting enough exercise or physical activity.  Being overweight.  Having too much fat, sugar, calories, or salt (sodium) in your diet.  Drinking too much alcohol.  Having long-term (chronic) kidney disease.  Having a family history of high blood pressure.  Age. Risk increases with age.  Race. You may be at higher risk if you are African American.  Gender. Men are at higher risk than women before age 77. After age 69, women are at higher risk than men.  Having obstructive sleep apnea.  Stress. What are the signs or symptoms?  High blood pressure may not cause symptoms. Very high blood pressure (hypertensive crisis) may cause: ? Headache. ? Feelings of worry or nervousness (anxiety). ? Shortness of breath. ? Nosebleed. ? A feeling of being sick to your stomach (nausea). ? Throwing up (vomiting). ? Changes in how you see. ? Very bad chest pain. ? Seizures. How is this treated?  This condition is treated by making healthy lifestyle changes, such as: ? Eating healthy foods. ? Exercising more. ? Drinking less alcohol.  Your health care provider may prescribe medicine if lifestyle changes are not enough to get your blood pressure under control, and if: ? Your top number is above 130. ? Your bottom number is above  80.  Your personal target blood pressure may vary. Follow these instructions at home: Eating and drinking   If told, follow the DASH eating plan. To follow this plan: ? Fill one half of your plate at each meal with fruits and vegetables. Fill one fourth of your plate at each meal with whole grains. Whole  Gastroesophageal Reflux Disease, Adult Gastroesophageal reflux (GER) happens when acid from the stomach flows up into the tube that connects the mouth and the stomach (esophagus). Normally, food travels down the esophagus and stays in the stomach to be digested. With GER, food and stomach acid sometimes move back up into the esophagus. You may have a disease called gastroesophageal reflux disease (GERD) if the reflux:  Happens often.  Causes frequent or very bad symptoms.  Causes problems such as damage to the esophagus. When this happens, the esophagus becomes sore and swollen (inflamed). Over time, GERD can make small holes (ulcers) in the lining of the esophagus. What are the causes? This condition is caused by a problem with the muscle between the esophagus and the stomach. When this muscle is weak or not normal, it does not close properly to keep food and acid from coming back up from the stomach. The muscle can be weak because of:  Tobacco use.  Pregnancy.  Having a certain type of hernia (hiatal hernia).  Alcohol use.  Certain foods and drinks, such as coffee, chocolate, onions, and peppermint. What increases the risk? You are more likely to develop this condition if you:  Are overweight.  Have a disease that affects your connective tissue.  Use NSAID medicines. What are the signs or symptoms? Symptoms of this condition include:  Heartburn.  Difficult or painful swallowing.  The feeling of having a lump in the throat.  A bitter taste in the mouth.  Bad breath.  Having a lot of saliva.  Having an upset or bloated stomach.  Belching.  Chest pain.  Different conditions can cause chest pain. Make sure you see your doctor if you have chest pain.  Shortness of breath or noisy breathing (wheezing).  Ongoing (chronic) cough or a cough at night.  Wearing away of the surface of teeth (tooth enamel).  Weight loss. How is this treated? Treatment will depend on how bad your symptoms are. Your doctor may suggest:  Changes to your diet.  Medicine.  Surgery. Follow these instructions at home: Eating and drinking   Follow a diet as told by your doctor. You may need to avoid foods and drinks such as: ? Coffee and tea (with or without caffeine). ? Drinks that contain alcohol. ? Energy drinks and sports drinks. ? Bubbly (carbonated) drinks or sodas. ? Chocolate and cocoa. ? Peppermint and mint flavorings. ? Garlic and onions. ? Horseradish. ? Spicy and acidic foods. These include peppers, chili powder, curry powder, vinegar, hot sauces, and BBQ sauce. ? Citrus fruit juices and citrus fruits, such as oranges, lemons, and limes. ? Tomato-based foods. These include red sauce, chili, salsa, and pizza with red sauce. ? Fried and fatty foods. These include donuts, french fries, potato chips, and high-fat dressings. ? High-fat meats. These include hot dogs, rib eye steak, sausage, ham, and bacon. ? High-fat dairy items, such as whole milk, butter, and cream cheese.  Eat small meals often. Avoid eating large meals.  Avoid drinking large amounts of liquid with your meals.  Avoid eating meals during the 2-3 hours before bedtime.  Avoid lying down right after you eat.  Do not exercise right after you eat. Lifestyle   Do not use any products that contain nicotine or tobacco. These include cigarettes, e-cigarettes, and chewing tobacco. If you need help quitting, ask your doctor.  Try to lower your stress. If you need help doing this, ask your doctor.  If you are overweight, lose an amount of weight that is healthy for you. Ask your  doctor about a safe weight loss goal. General instructions  Pay attention to any changes in your symptoms.  Take over-the-counter and prescription medicines only as told by your doctor. Do not take aspirin, ibuprofen, or other NSAIDs unless your doctor says it is okay.  Wear loose clothes. Do not wear anything tight around your waist.  Raise (elevate) the head of your bed about 6 inches (15 cm).  Avoid bending over if this makes your symptoms worse.  Keep all follow-up visits as told by your doctor. This is important. Contact a doctor if:  You have new symptoms.  You lose weight and you do not know why.  You have trouble swallowing or it hurts to swallow.  You have wheezing or a cough that keeps happening.  Your symptoms do not get better with treatment.  You have a hoarse voice. Get help right away if:  You have pain in your arms, neck, jaw, teeth, or back.  You feel sweaty, dizzy, or light-headed.  You have chest pain or shortness of breath.  You throw up (vomit) and your throw-up looks like blood or coffee grounds.  You pass out (  faint).  Your poop (stool) is bloody or black.  You cannot swallow, drink, or eat. Summary  If a person has gastroesophageal reflux disease (GERD), food and stomach acid move back up into the esophagus and cause symptoms or problems such as damage to the esophagus.  Treatment will depend on how bad your symptoms are.  Follow a diet as told by your doctor.  Take all medicines only as told by your doctor. This information is not intended to replace advice given to you by your health care provider. Make sure you discuss any questions you have with your health care provider. Document Revised: 01/20/2018 Document Reviewed: 01/20/2018 Elsevier Patient Education  [employer].  Atrial Fibrillation  Atrial fibrillation is a type of heartbeat that is irregular or fast. If you have this condition, your heart beats without any order.  This makes it hard for your heart to pump blood in a normal way. Atrial fibrillation may come and go, or it may become a long-lasting problem. If this condition is not treated, it can put you at higher risk for stroke, heart failure, and other heart problems. What are the causes? This condition may be caused by diseases that damage the heart. They include: High blood pressure. Heart failure. Heart valve disease. Heart surgery. Other causes include: Diabetes. Thyroid disease. Being overweight. Kidney disease. Sometimes the cause is not known. What increases the risk? You are more likely to develop this condition if: You are older. You smoke. You exercise often and very hard. You have a family history of this condition. You are a man. You use drugs. You drink a lot of alcohol. You have lung conditions, such as emphysema, pneumonia, or COPD. You have sleep apnea. What are the signs or symptoms? Common symptoms of this condition include: A feeling that your heart is beating very fast. Chest pain or discomfort. Feeling short of breath. Suddenly feeling light-headed or weak. Getting tired easily during activity. Fainting. Sweating. In some cases, there are no symptoms. How is this treated? Treatment for this condition depends on underlying conditions and how you feel when you have atrial fibrillation. They include: Medicines to: Prevent blood clots. Treat heart rate or heart rhythm problems. Using devices, such as a pacemaker, to correct heart rhythm problems. Doing surgery to remove the part of the heart that sends bad signals. Closing an area where clots can form in the heart (left atrial appendage). In some cases, your doctor will treat other underlying conditions. Follow these instructions at home: Medicines Take over-the-counter and prescription medicines only as told by your doctor. Do not take any new medicines without first talking to your doctor. If you are taking  blood thinners: Talk with your doctor before you take any medicines that have aspirin or NSAIDs, such as ibuprofen, in them. Take your medicine exactly as told by your doctor. Take it at the same time each day. Avoid activities that could hurt or bruise you. Follow instructions about how to prevent falls. Wear a bracelet that says you are taking blood thinners. Or, carry a card that lists what medicines you take. Lifestyle     Do not use any products that have nicotine or tobacco in them. These include cigarettes, e-cigarettes, and chewing tobacco. If you need help quitting, ask your doctor. Eat heart-healthy foods. Talk with your doctor about the right eating plan for you. Exercise regularly as told by your doctor. Do not drink alcohol. Lose weight if you are overweight. Do not use drugs,  including cannabis. General instructions If you have a condition that causes breathing to stop for a short period of time (apnea), treat it as told by your doctor. Keep a healthy weight. Do not use diet pills unless your doctor says they are safe for you. Diet pills may make heart problems worse. Keep all follow-up visits as told by your doctor. This is important. Contact a doctor if: You notice a change in the speed, rhythm, or strength of your heartbeat. You are taking a blood-thinning medicine and you get more bruising. You get tired more easily when you move or exercise. You have a sudden change in weight. Get help right away if:  You have pain in your chest or your belly (abdomen). You have trouble breathing. You have side effects of blood thinners, such as blood in your vomit, poop (stool), or pee (urine), or bleeding that cannot stop. You have any signs of a stroke. "BE FAST" is an easy way to remember the main warning signs: B - Balance. Signs are dizziness, sudden trouble walking, or loss of balance. E - Eyes. Signs are trouble seeing or a change in how you see. F - Face. Signs are sudden  weakness or loss of feeling in the face, or the face or eyelid drooping on one side. A - Arms. Signs are weakness or loss of feeling in an arm. This happens suddenly and usually on one side of the body. S - Speech. Signs are sudden trouble speaking, slurred speech, or trouble understanding what people say. T - Time. Time to call emergency services. Write down what time symptoms started. You have other signs of a stroke, such as: A sudden, very bad headache with no known cause. Feeling like you may vomit (nausea). Vomiting. A seizure. These symptoms may be an emergency. Do not wait to see if the symptoms will go away. Get medical help right away. Call your local emergency services (911 in the [place] Do not drive yourself to the hospital. Summary Atrial fibrillation is a type of heartbeat that is irregular or fast. You are at higher risk of this condition if you smoke, are older, have diabetes, or are overweight. Follow your doctor's instructions about medicines, diet, exercise, and follow-up visits. Get help right away if you have signs or symptoms of a stroke. Get help right away if you cannot catch your breath, or you have chest pain or discomfort. This information is not intended to replace advice given to you by your health care provider. Make sure you discuss any questions you have with your health care provider. Document Revised: 01/05/2019 Document Reviewed: 01/05/2019 Elsevier Patient Education  [place]. ? grains include whole-wheat pasta, brown rice, and whole-grain bread. ? Eat or drink low-fat dairy products, such as skim milk or low-fat yogurt. ? Fill one fourth of your plate at each meal with low-fat (lean) proteins. Low-fat proteins include fish, chicken without skin, eggs, beans, and tofu. ? Avoid fatty meat, cured and processed meat, or chicken with skin. ? Avoid pre-made or processed food.  Eat less than 1,500 mg of salt each day.  Do not drink alcohol  if: ? Your doctor tells you not to drink. ? You are pregnant, may be pregnant, or are planning to become pregnant.  If you drink alcohol: ? Limit how much you use to:  0-1 drink a day for women.  0-2 drinks a day for men. ? Be aware of how much alcohol is in your drink. In the  [place] one drink equals one 12 oz bottle of beer (355 mL), one 5 oz glass of wine (148 mL), or one 1 oz glass of hard liquor (44 mL). Lifestyle   Work with your doctor to stay at a healthy weight or to lose weight. Ask your doctor what the best weight is for you.  Get at least 30 minutes of exercise most days of the week. This may include walking, swimming, or biking.  Get at least 30 minutes of exercise that strengthens your muscles (resistance exercise) at least 3 days a week. This may include lifting weights or doing Pilates.  Do not use any products that contain nicotine or tobacco, such as cigarettes, e-cigarettes, and chewing tobacco. If you need help quitting, ask your doctor.  Check your blood pressure at home as told by your doctor.  Keep all follow-up visits as told by your doctor. This is important. Medicines  Take over-the-counter and prescription medicines only as told by your doctor. Follow directions carefully.  Do not skip doses of blood pressure medicine. The medicine does not work as well if you skip doses. Skipping doses also puts you at risk for problems.  Ask your doctor about side effects or reactions to medicines that you should watch for. Contact a doctor if you:  Think you are having a reaction to the medicine you are taking.  Have headaches that keep coming back (recurring).  Feel dizzy.  Have swelling in your ankles.  Have trouble with your vision. Get help right away if you:  Get a very bad headache.  Start to feel mixed up (confused).  Feel weak or numb.  Feel faint.  Have very bad pain in your: ? Chest. ? Belly (abdomen).  Throw up more than once.  Have  trouble breathing. Summary  Hypertension is another name for high blood pressure.  High blood pressure forces your heart to work harder to pump blood.  For most people, a normal blood pressure is less than 120/80.  Making healthy choices can help lower blood pressure. If your blood pressure does not get lower with healthy choices, you may need to take medicine. This information is not intended to replace advice given to you by your health care provider. Make sure you discuss any questions you have with your health care provider. Document Revised: 03/24/2018 Document Reviewed: 03/24/2018 Elsevier Patient Education  2020 [employer].

## 2020-05-14 NOTE — Assessment & Plan Note (Addendum)
Patient hypertension well managed on current medication.  Patient is on metoprolol 25 mg tablets daily by mouth, no changes to medication dose.  Continue low-sodium diet.  Education provided with printed handouts given.  TSH completed per cardiology order.  Labs pending

## 2020-05-15 LAB — TSH: TSH: 1.07 u[IU]/mL (ref 0.450–4.500)

## 2020-07-06 ENCOUNTER — Other Ambulatory Visit: Payer: Self-pay | Admitting: Family Medicine

## 2020-09-05 ENCOUNTER — Other Ambulatory Visit: Payer: Self-pay | Admitting: Family Medicine

## 2020-09-05 DIAGNOSIS — G479 Sleep disorder, unspecified: Secondary | ICD-10-CM

## 2020-09-26 ENCOUNTER — Ambulatory Visit (INDEPENDENT_AMBULATORY_CARE_PROVIDER_SITE_OTHER): Payer: Medicare Other | Admitting: Nurse Practitioner

## 2020-09-26 DIAGNOSIS — Z9181 History of falling: Secondary | ICD-10-CM

## 2020-09-26 DIAGNOSIS — S80929A Unspecified superficial injury of unspecified lower leg, initial encounter: Secondary | ICD-10-CM | POA: Diagnosis not present

## 2020-09-26 DIAGNOSIS — Z79891 Long term (current) use of opiate analgesic: Secondary | ICD-10-CM | POA: Diagnosis not present

## 2020-09-26 DIAGNOSIS — T148XXA Other injury of unspecified body region, initial encounter: Secondary | ICD-10-CM | POA: Insufficient documentation

## 2020-09-26 DIAGNOSIS — M5136 Other intervertebral disc degeneration, lumbar region: Secondary | ICD-10-CM | POA: Diagnosis not present

## 2020-09-26 NOTE — Assessment & Plan Note (Signed)
New blister bilateral lower extremity from a fall in the bathroom.  Patient reports sending pictures via MyChart but I was unable to retrieve pictures and unable to tell exactly what type of blister or wound or skin tear is on patient's extremities.  Advised patient to call the office and schedule an in office visit to reevaluate wound.   Patient verbalized understanding and promised to make the phone call.

## 2020-09-26 NOTE — Progress Notes (Signed)
   Virtual Visit via telephone Note Due to COVID-19 pandemic this visit was conducted virtually. This visit type was conducted due to national recommendations for restrictions regarding the COVID-19 Pandemic (e.g. social distancing, sheltering in place) in an effort to limit this patient's exposure and mitigate transmission in our community. All issues noted in this document were discussed and addressed.  A physical exam was not performed with this format.  I connected with Francisco Tanner on 09/26/20 at  4 PM by telephone and verified that I am speaking with the correct person using two identifiers. Francisco Tanner is currently located at home during visit. The provider, Ivy Lynn, NP is located in their office at time of visit.  I discussed the limitations, risks, security and privacy concerns of performing an evaluation and management service by telephone and the availability of in person appointments. I also discussed with the patient that there may be a patient responsible charge related to this service. The patient expressed understanding and agreed to proceed.   History and Present Illness:  HPI  Patient is reporting a new blood blister on lower extremity after hitting his leg trying to enter the bathtub.  Patient reports sending a picture of injury but was unable to upload it into epic.  Patient is reporting moderate pain, no skin tear or no fever.   Review of Systems  Constitutional: Negative for chills, fever and malaise/fatigue.  HENT: Negative.   Respiratory: Negative.   Cardiovascular: Negative.   Musculoskeletal: Positive for falls.  Skin:       Blood blister  All other systems reviewed and are negative.    Observations/Objective: Televisit  Assessment and Plan:   Blister New blister bilateral lower extremity from a fall in the bathroom.  Patient reports sending pictures via MyChart but I was unable to retrieve pictures and unable to tell exactly what type of blister or  wound or skin tear is on patient's extremities.  Advised patient to call the office and schedule an in office visit to reevaluate wound.   Patient verbalized understanding and promised to make the phone call.   Follow Up Instructions: Reschedule an appointment for office visit to reassess wound.    I discussed the assessment and treatment plan with the patient. The patient was provided an opportunity to ask questions and all were answered. The patient agreed with the plan and demonstrated an understanding of the instructions.   The patient was advised to call back or seek an in-person evaluation if the symptoms worsen or if the condition fails to improve as anticipated.  The above assessment and management plan was discussed with the patient. The patient verbalized understanding of and has agreed to the management plan. Patient is aware to call the clinic if symptoms persist or worsen. Patient is aware when to return to the clinic for a follow-up visit. Patient educated on when it is appropriate to go to the emergency department.   Time call ended:  04:9 pm  I provided 9 minutes of non-face-to-face time during this encounter.    Ivy Lynn, NP

## 2020-09-28 ENCOUNTER — Other Ambulatory Visit: Payer: Self-pay

## 2020-09-28 ENCOUNTER — Encounter: Payer: Self-pay | Admitting: Nurse Practitioner

## 2020-09-28 ENCOUNTER — Ambulatory Visit (INDEPENDENT_AMBULATORY_CARE_PROVIDER_SITE_OTHER): Payer: Medicare Other | Admitting: Nurse Practitioner

## 2020-09-28 VITALS — BP 154/69 | HR 74 | Temp 98.1°F

## 2020-09-28 DIAGNOSIS — S80922A Unspecified superficial injury of left lower leg, initial encounter: Secondary | ICD-10-CM | POA: Diagnosis not present

## 2020-09-28 DIAGNOSIS — T148XXA Other injury of unspecified body region, initial encounter: Secondary | ICD-10-CM

## 2020-09-28 NOTE — Progress Notes (Signed)
Acute Office Visit  Subjective:    Patient ID: Francisco Tanner, male    DOB: 09/20/1945, 75 y.o.   MRN: [id number]  Chief Complaint  Patient presents with  . Blister    HPI Patient is in today for  a hematoma on left lower extremity.  [Left lateral shin].  Patient reports trying to get into his shower when he slipped and fell hitting his left shin and developed a huge hematoma.  Patient is reporting moderate pain, and concerned about draining hematoma.  Past Medical History:  Diagnosis Date  . AAA (abdominal aortic aneurysm) ([place])    Needs [place] on/after 06/25/2021. 28 mm  . Abnormal CT scan    SPN right mid zone. first detected 08/16/08; see [facility]   . Atrial flutter ([place])    a. echo 09/28/11 EF 50-55%, mild RAE, RV mildly dilated, systolic fxn mild-mod reduced;  b. 01/2012 s/p EPS & RFCA.  [name] Chronic back pain   . Chronic diastolic CHF (congestive heart failure) ([place])   . Claustrophobia   . COPD (chronic obstructive pulmonary disease) ([place])    Wert. PFTs 10/09/08 FEV1 1.39 (44%) ratio 40, DLCO 63%. 16% improvement after bronchodilator. overnight pulse ox 02/28/09 5:48m pent with sat< 89 sleeping > declined further o2 07/05/09. HFA 75% 04/23/09.   [name] Dyslipidemia 03/03/2016  . GERD (gastroesophageal reflux disease)   . Hypertension   . Lung nodule   . Prostate enlargement   . Sleep apnea    Stop Bang score of 5. Pt has had sleep study and was told he didn't have Sleep Apnea.  . Vitamin B12 deficiency 02/01/2018    Past Surgical History:  Procedure Laterality Date  . A FLUTTER ABLATION N/A 02/02/2012   Procedure: ABLATION A FLUTTER;  Surgeon: Deboraha Sprang, MD;  Location: [facility] CATH LAB;  Service: Cardiovascular;  Laterality: N/A;  . BACK SURGERY     x4  . CARDIOVERSION     in past  . CATARACT EXTRACTION W/PHACO Right 09/04/2014   Procedure: CATARACT EXTRACTION PHACO AND INTRAOCULAR LENS PLACEMENT RIGHT EYE CDE=12.15;  Surgeon: Tonny Branch, MD;  Location: [facility] ORS;  Service: Ophthalmology;   Laterality: Right;  . hip replacement surgery     L   . INCISION AND DRAINAGE HIP  09/24/2011   Procedure: IRRIGATION AND DEBRIDEMENT HIP;  Surgeon: Gearlean Alf, MD;  Location: [facility] ORS;  Service: Orthopedics;  Laterality: Left;  . KNEE SURGERY Left   . KYPHOSIS SURGERY      Family History  Problem Relation Age of Onset  . Diabetes Father   . Emphysema Paternal Aunt        smoked and worked in a [employer]  . Cancer Neg Hx     Social History   Socioeconomic History  . Marital status: Legally Separated    Spouse name: Not on file  . Number of children: Not on file  . Years of education: Not on file  . Highest education level: Not on file  Occupational History  . Not on file  Tobacco Use  . Smoking status: Former Smoker    Packs/day: 0.50    Years: 20.00    Pack years: 10.00    Types: Cigarettes    Quit date: 07/28/2002    Years since quitting: 18.1  . Smokeless tobacco: Never Used  Vaping Use  . Vaping Use: Never used  Substance and Sexual Activity  . Alcohol use: Not Currently    Comment: occasionally at a  social gathering  . Drug use: No  . Sexual activity: Not Currently  Other Topics Concern  . Not on file  Social History Narrative   Separated; children; retired.    Social Determinants of Health   Financial Resource Strain: Not on file  Food Insecurity: Not on file  Transportation Needs: Not on file  Physical Activity: Not on file  Stress: Not on file  Social Connections: Not on file  Intimate Partner Violence: Not on file    Outpatient Medications Prior to Visit  Medication Sig Dispense Refill  . apixaban (ELIQUIS) 5 MG TABS tablet Take 1 tablet (5 mg total) by mouth 2 (two) times daily. 60 tablet 10  . fluticasone furoate-vilanterol (BREO ELLIPTA) 200-25 MCG/INH AEPB USE 1 INHALATION DAILY    . HYDROcodone-acetaminophen (NORCO) 10-325 MG tablet Take 1 tablet by mouth every 6 (six) hours as needed.    . metoprolol succinate (TOPROL-XL) 25 MG 24 hr  tablet TAKE ONE (1) TABLET EACH DAY 90 tablet 1  . metoprolol tartrate (LOPRESSOR) 25 MG tablet Take 1 tablet (25 mg total) by mouth as needed. 30 tablet 3  . omeprazole (PRILOSEC) 20 MG capsule Take 1 capsule (20 mg total) by mouth daily. 90 capsule 1  . OXYGEN 3lpm with sleep and 2.5lpm with exertion AHC    . sodium chloride (OCEAN) 0.65 % nasal spray Place 1 spray into the nose as needed. For dryness    . VENTOLIN HFA 108 (90 Base) MCG/ACT inhaler Inhale 2 puffs into the lungs every 6 (six) hours as needed.    . zolpidem (AMBIEN) 10 MG tablet Take 1 tablet (10 mg total) by mouth at bedtime. for sleep 30 tablet 5   Facility-Administered Medications Prior to Visit  Medication Dose Route Frequency Provider Last Rate Last Admin  . cyanocobalamin ((VITAMIN B-12)) injection 1,000 mcg  1,000 mcg Intramuscular Q30 days Loman Brooklyn, FNP   1,000 mcg at 05/14/20 1405    Allergies  Allergen Reactions  . Belsomra [Suvorexant]     Hard to breathe, hot and sweats within 20 min of taking   . Temazepam   . Pravastatin Rash    Elevated heart rate per patient    Review of Systems  Constitutional: Negative.   HENT: Negative.   Cardiovascular: Negative.   Gastrointestinal: Negative.   Genitourinary: Negative.   Skin: Positive for color change.       Eschar  Hematoma   Neurological: Negative.   Psychiatric/Behavioral: Negative.   All other systems reviewed and are negative.      Objective:    Physical Exam Vitals reviewed.  Constitutional:      Appearance: Normal appearance.  HENT:     Head: Normocephalic.     Nose: Nose normal.  Cardiovascular:     Rate and Rhythm: Normal rate.     Pulses: Normal pulses.     Heart sounds: Normal heart sounds.  Pulmonary:     Effort: Pulmonary effort is normal.     Breath sounds: Normal breath sounds.  Abdominal:     General: Bowel sounds are normal.  Skin:    General: Skin is warm.     Findings: Erythema present.     Comments: hematoma   Neurological:     Mental Status: He is alert and oriented to person, place, and time.     There were no vitals taken for this visit. Wt Readings from Last 3 Encounters:  04/25/20 189 lb (85.7 kg)  04/12/20 189 lb (  85.7 kg)  11/09/19 191 lb (86.6 kg)    Health Maintenance Due  Topic Date Due  . COVID-19 Vaccine (1) Never done  . TETANUS/TDAP  Never done    There are no preventive care reminders to display for this patient.   Lab Results  Component Value Date   TSH 1.070 05/14/2020   Lab Results  Component Value Date   WBC 7.1 11/09/2019   HGB 13.3 11/09/2019   HCT 40.2 11/09/2019   MCV 91 11/09/2019   PLT 153 11/09/2019   Lab Results  Component Value Date   NA 143 11/09/2019   K 4.5 11/09/2019   CO2 31 (H) 11/09/2019   GLUCOSE 104 (H) 11/09/2019   BUN 11 11/09/2019   CREATININE 0.56 (L) 11/09/2019   BILITOT 0.7 11/09/2019   ALKPHOS 45 11/09/2019   AST 12 11/09/2019   ALT 7 11/09/2019   PROT 6.0 11/09/2019   ALBUMIN 3.7 11/09/2019   CALCIUM 8.9 11/09/2019   ANIONGAP 5 08/31/2014   GFR 143.85 01/29/2017   Lab Results  Component Value Date   CHOL 141 11/09/2019   Lab Results  Component Value Date   HDL 40 11/09/2019   Lab Results  Component Value Date   LDLCALC 91 11/09/2019   Lab Results  Component Value Date   TRIG 44 11/09/2019   Lab Results  Component Value Date   CHOLHDL 3.5 11/09/2019   Lab Results  Component Value Date   HGBA1C (H) 10/28/2009    6.3 (NOTE) The ADA recommends the following therapeutic goal for glycemic control related to Hgb A1c measurement: Goal of therapy: <6.5 Hgb A1c  Reference: American Diabetes Association: Clinical Practice Recommendations 2010, Diabetes Care, 2010, 33: (Suppl  1).       Assessment & Plan:   Problem List Items Addressed This Visit      Other   Hematoma - Primary    Hematoma length 13 cm x 9 cm, eschar covered non-draining and hard to touch. erythematous around the edges. Patient is  reporting moderate pain, ambulatory referral to general surgery completed, consulted with Dr. Blake Divine.  Patient is due for office visit general surgery Tuesday 10/02/2020. Instructions given to patient's to keep hematoma dry, cover with gauze and Ace lightly and if ruptured will debride eschar.  General surgery consult. Advised patient to call with worsening symptoms      Relevant Orders   Ambulatory referral to [place], NP

## 2020-09-28 NOTE — Patient Instructions (Signed)
Hematoma A hematoma is a collection of blood. A hematoma can happen:  Under the skin.  In an organ.  In a body space.  In a joint space.  In other tissues. The blood can thicken (clot) to form a lump that you can see and feel. The lump is often hard and may become sore and tender. The lump can be very small or very big. Most hematomas get better in a few days to weeks. However, some hematomas may be serious and need medical care. What are the causes? This condition is caused by:  An injury.  Blood that leaks under the skin.  Problems from surgeries.  Medical conditions that cause bleeding or bruising. What increases the risk? You are more likely to develop this condition if:  You are an older adult.  You use medicines that thin your blood. What are the signs or symptoms? Symptoms depend on where the hematoma is in your body.  If the hematoma is under the skin, there is: ? A firm lump on the body. ? Pain and tenderness in the area. ? Bruising. The skin above the lump may be blue, dark blue, purple-red, or yellowish.  If the hematoma is deep in the tissues or body spaces, there may be: ? Blood in the stomach. This may cause pain in the belly (abdomen), weakness, passing out (fainting), and shortness of breath. ? Blood in the head. This may cause a headache, weakness, trouble speaking or understanding speech, or passing out.   How is this diagnosed? This condition is diagnosed based on:  Your medical history.  A physical exam.  Imaging tests, such as ultrasound or CT scan.  Blood tests. How is this treated? Treatment depends on the cause, size, and location of the hematoma. Treatment may include:  Doing nothing. Many hematomas go away on their own without treatment.  Surgery or close monitoring. This may be needed for large hematomas or hematomas that affect the body's organs.  Medicines. These may be given if a medical condition caused the hematoma. Follow  these instructions at home: Managing pain, stiffness, and swelling  If told, put ice on the area. ? Put ice in a plastic bag. ? Place a towel between your skin and the bag. ? Leave the ice on for 20 minutes, 2-3 times a day for the first two days.  If told, put heat on the affected area after putting ice on the area for two days. Use the heat source that your doctor tells you to use. This could be a moist heat pack or a heating pad. To do this: ? Place a towel between your skin and the heat source. ? Leave the heat on for 20-30 minutes. ? Remove the heat if your skin turns bright red. This is very important if you are unable to feel pain, heat, or cold. You may have a greater risk of getting burned.  Raise (elevate) the affected area above the level of your heart while you are sitting or lying down.  Wrap the affected area with an elastic bandage, if told by your doctor. Do not wrap the bandage too tightly.  If your hematoma is on a leg or foot and is painful, your doctor may give you crutches. Use them as told by your doctor.   General instructions  Take over-the-counter and prescription medicines only as told by your doctor.  Keep all follow-up visits as told by your doctor. This is important. Contact a doctor if:  You   have a fever.  The swelling or bruising gets worse.  You start to get more hematomas. Get help right away if:  Your pain gets worse.  Your pain is not getting better with medicine.  Your skin over the hematoma breaks or starts to bleed.  Your hematoma is in your chest or belly and you: ? Pass out. ? Feel weak. ? Become short of breath.  You have a hematoma on your scalp that is caused by a fall or injury, and you: ? Have a headache that gets worse. ? Have trouble speaking or understanding speech. ? Become less alert or you pass out. Summary  A hematoma is a collection of blood in any part of your body.  Most hematomas get better on their own in a  few days to weeks. Some may need medical care.  Follow instructions from your doctor about how to care for your hematoma.  Contact a doctor if the swelling or bruising gets worse, or if you are short of breath. This information is not intended to replace advice given to you by your health care provider. Make sure you discuss any questions you have with your health care provider. Document Revised: 12/17/2017 Document Reviewed: 12/17/2017 Elsevier Patient Education  2021 Elsevier Inc.  

## 2020-09-28 NOTE — Assessment & Plan Note (Signed)
Hematoma length 13 cm x 9 cm, eschar covered non-draining and hard to touch. erythematous around the edges. Patient is reporting moderate pain, ambulatory referral to general surgery completed, consulted with Dr. Blake Divine.  Patient is due for office visit general surgery Tuesday 10/02/2020. Instructions given to patient's to keep hematoma dry, cover with gauze and Ace lightly and if ruptured will debride eschar.  General surgery consult. Advised patient to call with worsening symptoms

## 2020-10-02 ENCOUNTER — Other Ambulatory Visit: Payer: Self-pay

## 2020-10-02 ENCOUNTER — Ambulatory Visit: Payer: Medicare Other | Admitting: General Surgery

## 2020-10-02 ENCOUNTER — Encounter: Payer: Self-pay | Admitting: General Surgery

## 2020-10-02 VITALS — BP 150/81 | HR 102 | Temp 97.6°F | Resp 20 | Ht 67.0 in | Wt 178.0 lb

## 2020-10-02 DIAGNOSIS — S8012XA Contusion of left lower leg, initial encounter: Secondary | ICD-10-CM

## 2020-10-02 DIAGNOSIS — L03116 Cellulitis of left lower limb: Secondary | ICD-10-CM | POA: Diagnosis not present

## 2020-10-02 MED ORDER — DOXYCYCLINE HYCLATE 50 MG PO CAPS: 50.0000 mg | ORAL_CAPSULE | Freq: Two times a day (BID) | ORAL | 0 refills | Status: AC

## 2020-10-02 NOTE — Progress Notes (Signed)
[facility] History and Physical  Reason for Referral: Left leg hematoma  Referring Physician: Jac Canavan NP  Chief Complaint    Wound Check      Francisco Tanner is a 75 y.o. male.  HPI: Francisco Tanner is a 75 yo with CHF, A fib on anticoagulation, AAA, who split in the shower and hit is leg about 1 week ago. The area started on smaller and grew. There was a black scab on the area. He was seen at his PCP and they contacted me about a plan. I suggested to keep area intact and dry and to follow up with me. The patient reports worsening erythema and swelling in the leg since Friday when he saw the PCP.  He says that he has little help at home to dress a wound. He is afraid his insurance will not cover home health RN at this time because of some changes.  He is on chronic pain meds for back pain.   Past Medical History:  Diagnosis Date  . AAA (abdominal aortic aneurysm) ([place])    Needs [place] on/after 06/25/2021. 28 mm  . Abnormal CT scan    SPN right mid zone. first detected 08/16/08; see CT [facility]   . Atrial flutter ([place])    a. echo 09/28/11 EF 50-55%, mild RAE, RV mildly dilated, systolic fxn mild-mod reduced;  b. 01/2012 s/p EPS & RFCA.  [name] Chronic back pain   . Chronic diastolic CHF (congestive heart failure) ([place])   . Claustrophobia   . COPD (chronic obstructive pulmonary disease) ([place])    Wert. PFTs 10/09/08 FEV1 1.39 (44%) ratio 40, DLCO 63%. 16% improvement after bronchodilator. overnight pulse ox 02/28/09 5:46m pent with sat< 89 sleeping > declined further o2 07/05/09. HFA 75% 04/23/09.   [name] Dyslipidemia 03/03/2016  . GERD (gastroesophageal reflux disease)   . Hypertension   . Lung nodule   . Prostate enlargement   . Sleep apnea    Stop Bang score of 5. Pt has had sleep study and was told he didn't have Sleep Apnea.  . Vitamin B12 deficiency 02/01/2018    Past Surgical History:  Procedure Laterality Date  . A FLUTTER ABLATION N/A 02/02/2012   Procedure: ABLATION A FLUTTER;   Surgeon: Deboraha Sprang, MD;  Location: [facility] CATH LAB;  Service: Cardiovascular;  Laterality: N/A;  . BACK SURGERY     x4  . CARDIOVERSION     in past  . CATARACT EXTRACTION W/PHACO Right 09/04/2014   Procedure: CATARACT EXTRACTION PHACO AND INTRAOCULAR LENS PLACEMENT RIGHT EYE CDE=12.15;  Surgeon: Tonny Branch, MD;  Location: [facility] ORS;  Service: Ophthalmology;  Laterality: Right;  . hip replacement surgery     L   . INCISION AND DRAINAGE HIP  09/24/2011   Procedure: IRRIGATION AND DEBRIDEMENT HIP;  Surgeon: Gearlean Alf, MD;  Location: [facility] ORS;  Service: Orthopedics;  Laterality: Left;  . KNEE SURGERY Left   . KYPHOSIS SURGERY      Family History  Problem Relation Age of Onset  . Diabetes Father   . Emphysema Paternal Aunt        smoked and worked in a [employer]  . Cancer Neg Hx     Social History   Tobacco Use  . Smoking status: Former Smoker    Packs/day: 0.50    Years: 20.00    Pack years: 10.00    Types: Cigarettes    Quit date: 07/28/2002    Years since quitting: 18.1  .  Smokeless tobacco: Never Used  Vaping Use  . Vaping Use: Never used  Substance Use Topics  . Alcohol use: Not Currently    Comment: occasionally at a social gathering  . Drug use: No    Medications: I have reviewed the patient's current medications. Allergies as of 10/02/2020      Reactions   Belsomra [suvorexant]    Hard to breathe, hot and sweats within 20 min of taking    Temazepam    Pravastatin Rash   Elevated heart rate per patient      Medication List       Accurate as of October 02, 2020 11:56 AM. If you have any questions, ask your nurse or doctor.        apixaban 5 MG Tabs tablet Commonly known as: Eliquis Take 1 tablet (5 mg total) by mouth 2 (two) times daily.   Breo Ellipta 200-25 MCG/INH Aepb Generic drug: fluticasone furoate-vilanterol USE 1 INHALATION DAILY   HYDROcodone-acetaminophen 10-325 MG tablet Commonly known as: NORCO Take 1 tablet by mouth every 6 (six) hours as  needed.   metoprolol succinate 25 MG 24 hr tablet Commonly known as: TOPROL-XL TAKE ONE (1) TABLET EACH DAY   metoprolol tartrate 25 MG tablet Commonly known as: LOPRESSOR Take 1 tablet (25 mg total) by mouth as needed.   omeprazole 20 MG capsule Commonly known as: PRILOSEC Take 1 capsule (20 mg total) by mouth daily.   OXYGEN 3lpm with sleep and 2.5lpm with exertion AHC   sodium chloride 0.65 % nasal spray Commonly known as: OCEAN Place 1 spray into the nose as needed. For dryness   Ventolin HFA 108 (90 Base) MCG/ACT inhaler Generic drug: albuterol Inhale 2 puffs into the lungs every 6 (six) hours as needed.   zolpidem 10 MG tablet Commonly known as: AMBIEN Take 1 tablet (10 mg total) by mouth at bedtime. for sleep        ROS:  A comprehensive review of systems was negative except for: Hematologic/lymphatic: positive for bleeding and anticoagulation Musculoskeletal: positive for back pain and left leg pain and swelling  Blood pressure (!) 150/81, pulse (!) 102, temperature 97.6 F (36.4 C), temperature source Other (Comment), resp. rate 20, height 5\' 7"  (1.702 m), weight 178 lb (80.7 kg), SpO2 (!) 89 %. Physical Exam Vitals reviewed.  HENT:     Head: Normocephalic.     Nose: Nose normal.  Eyes:     Extraocular Movements: Extraocular movements intact.  Cardiovascular:     Rate and Rhythm: Normal rate.  Pulmonary:     Comments: On oxygen, increased work breathing at baseline Musculoskeletal:        General: Tenderness present.     Left lower leg: Edema present.     Comments: Large eschar in left lower leg, with cellulitis extending around, swelling into the foot, bloody drainage on edges  Neurological:     General: No focal deficit present.     Mental Status: He is alert and oriented to person, place, and time.  Psychiatric:        Mood and Affect: Mood normal.        Behavior: Behavior normal.   Pre debridement:    Post debridement/ Incision and  drainage:      Results: None   Assessment & Plan:  Francisco Tanner is a 75 y.o. male with a large hematoma on the left lower leg that has surrounding cellulitis. At this time we cannot keep the eschar intact as  I am afraid it is getting infected. We discussed incision and drainage of the hematoma and removal of the eschar. Discussed risk of bleeding, infection, long term wound care and issues with patient have someone to help him with his wound.   Discussed that ideally daily dressing changes would be done but he cannot reach his legs. We discussed him trying to find help from his sister, Clarise Cruz who lives across the street. He is going to try to get her to agree to help.  For now will do a dressing that will leave in place and change on Thursday.   Procedure: Incision and drainage of hematoma, removal of eschar/ necrotic skin Pre- procedure diagnosis: Left lower extremity hematoma Post procedure diagnosis: Same   Description: The area was cleaned gently with betadine. A sharp pair of scissors was used to open the eschar and trim it away from the viable skin. The eschar debridement measured about 8X4cm in size. Hematoma was evacuated underneath, cultures were obtained. The wound was irrigated with saline. Silvernitrate was used on one edges that was bleeding. A xeroform gauze was placed and ABD pad. The leg was wrapped with a ACE wrap to help with the swelling and apply pressure.   Doxycyline was prescribed for the cellulitis.  All questions were answered to the satisfaction of the patient.  Future Appointments  Date Time Provider [place]  10/04/2020  1:45 PM Virl Cagey, MD [facility] None        Virl Cagey 10/02/2020, 11:56 AM

## 2020-10-02 NOTE — Patient Instructions (Addendum)
Take antibiotic for cellulitis Leave the ACE wrap in place Will change the dressing on Thursday We will contact an Agency with [place] RN to see if they can help you.  Take your home hydrocodone for pain.  Keep your leg elevated.  Do not get the leg wet.

## 2020-10-03 ENCOUNTER — Other Ambulatory Visit: Payer: Self-pay | Admitting: Family Medicine

## 2020-10-03 DIAGNOSIS — G479 Sleep disorder, unspecified: Secondary | ICD-10-CM

## 2020-10-04 ENCOUNTER — Telehealth: Payer: Self-pay

## 2020-10-04 ENCOUNTER — Ambulatory Visit (INDEPENDENT_AMBULATORY_CARE_PROVIDER_SITE_OTHER): Payer: Self-pay | Admitting: General Surgery

## 2020-10-04 ENCOUNTER — Other Ambulatory Visit: Payer: Self-pay

## 2020-10-04 ENCOUNTER — Encounter: Payer: Self-pay | Admitting: General Surgery

## 2020-10-04 VITALS — BP 131/76 | HR 92 | Temp 97.1°F | Resp 16 | Ht 67.0 in | Wt 178.0 lb

## 2020-10-04 DIAGNOSIS — S8012XA Contusion of left lower leg, initial encounter: Secondary | ICD-10-CM

## 2020-10-04 NOTE — Telephone Encounter (Signed)
Patient is out of his Ambien. Was just in and saw Je. He made a televisit appointment with Karsten Fells on Wed 3/16. Earliest I could find for him. Can we fill his Ambien since his appt is upcoming?  He is totally out. Please call and let patient know if this is okay.

## 2020-10-04 NOTE — Patient Instructions (Signed)
Saline dampened gauze, pad and ace wrap daily to twice daily. Ok to not use ace at night or with elevating legs.

## 2020-10-04 NOTE — Progress Notes (Signed)
[facility] Note   HPI:  75 y.o. Male presents to clinic for post-op follow-up evaluation of his left leg wound. He is here with his sister who can help him with wound care. The wound not drained and the ace has remained in place but is hurting the patient because of the swelling.   Review of Systems:  No fever  Left leg swelling/ bruising  All other review of systems: otherwise negative   Vital Signs:  BP 131/76   Pulse 92   Temp (!) 97.1 F (36.2 C) (Other (Comment))   Resp 16   Ht 5\' 7"  (1.702 m)   Wt 178 lb (80.7 kg)   SpO2 (!) 89%   BMI 27.88 kg/m    Physical Exam:  Physical Exam Vitals reviewed.  Cardiovascular:     Rate and Rhythm: Normal rate.  Pulmonary:     Effort: Pulmonary effort is normal.  Musculoskeletal:     Left lower leg: Edema present.     Comments: Bruising into feet/ toes dependently, improved edema with ACE wrap         Assessment:  75 y.o. yo Male with wound on left leg from traumatic hematoma. He is doing ok but had pain from the ACE wrap. Redressed with gauze dressing and ABD, ACE, will not wrap as tight.   Plan:  Saline dampened gauze, pad and ace wrap daily to twice daily. Ok to not use ace at night or with elevating legs.  Future Appointments  Date Time Provider [place]  10/11/2020  3:45 PM Virl Cagey, MD [facility] None     All of the above recommendations were discussed with the patient and patient's family, and all of patient's and family's questions were answered to their expressed satisfaction.  Curlene Labrum, MD [facility] [address] [place], [place] [address] [phone] (office)

## 2020-10-04 NOTE — Telephone Encounter (Signed)
Francisco Tanner, wanted to check with you before I told patient this had to be filled at an appointment.  Please advise.

## 2020-10-04 NOTE — Telephone Encounter (Signed)
I'm sorry, this has to be filled in a visit per our policy. Je was only seeing him for something acute so she would not have been able to prescribe.

## 2020-10-05 LAB — WOUND CULTURE
MICRO NUMBER:: 11621128
SPECIMEN QUALITY:: ADEQUATE

## 2020-10-05 NOTE — Telephone Encounter (Signed)
Pt aware of provider feedback and voiced understanding. 

## 2020-10-10 ENCOUNTER — Encounter: Payer: Self-pay | Admitting: Family Medicine

## 2020-10-10 ENCOUNTER — Ambulatory Visit (INDEPENDENT_AMBULATORY_CARE_PROVIDER_SITE_OTHER): Payer: Medicare Other | Admitting: Family Medicine

## 2020-10-10 VITALS — BP 136/74 | HR 76 | Wt 180.8 lb

## 2020-10-10 DIAGNOSIS — G479 Sleep disorder, unspecified: Secondary | ICD-10-CM

## 2020-10-10 DIAGNOSIS — K219 Gastro-esophageal reflux disease without esophagitis: Secondary | ICD-10-CM

## 2020-10-10 DIAGNOSIS — S81802D Unspecified open wound, left lower leg, subsequent encounter: Secondary | ICD-10-CM

## 2020-10-10 MED ORDER — METOPROLOL TARTRATE 25 MG PO TABS: 25.0000 mg | ORAL_TABLET | ORAL | 3 refills | Status: AC | PRN

## 2020-10-10 MED ORDER — METOPROLOL SUCCINATE ER 25 MG PO TB24: 25.0000 mg | ORAL_TABLET | Freq: Every day | ORAL | 1 refills | Status: AC

## 2020-10-10 MED ORDER — OMEPRAZOLE 20 MG PO CPDR: 20.0000 mg | DELAYED_RELEASE_CAPSULE | Freq: Every day | ORAL | 1 refills | Status: AC

## 2020-10-10 MED ORDER — ZOLPIDEM TARTRATE 10 MG PO TABS: 10.0000 mg | ORAL_TABLET | Freq: Every day | ORAL | 5 refills | Status: AC

## 2020-10-10 NOTE — Progress Notes (Signed)
COVID updated in chart. Tdap not in [facility].

## 2020-10-10 NOTE — Progress Notes (Signed)
Virtual Visit via Telephone Note  I connected with Francisco Tanner on 10/10/20 at 12:01 PM by telephone and verified that I am speaking with the correct person using two identifiers. Francisco Tanner is currently located at home and nobody is currently with him during this visit. The provider, Loman Brooklyn, FNP is located in their home at time of visit.  I discussed the limitations, risks, security and privacy concerns of performing an evaluation and management service by telephone and the availability of in person appointments. I also discussed with the patient that there may be a patient responsible charge related to this service. The patient expressed understanding and agreed to proceed.  Subjective: PCP: Loman Brooklyn, FNP  Chief Complaint  Patient presents with  . Medical Management of Chronic Issues   Patient is completely out of his Ambien.  He is having a really hard time right now with a wound on his left lower extremity.  Reports it causes him more problems that have been three hip replacements in the same month did.  This is managed by Dr. Constance Haw.  ROS: Per HPI  Current Outpatient Medications:  .  apixaban (ELIQUIS) 5 MG TABS tablet, Take 1 tablet (5 mg total) by mouth 2 (two) times daily., Disp: 60 tablet, Rfl: 10 .  fluticasone furoate-vilanterol (BREO ELLIPTA) 200-25 MCG/INH AEPB, USE 1 INHALATION DAILY, Disp: , Rfl:  .  HYDROcodone-acetaminophen (NORCO) 10-325 MG tablet, Take 1 tablet by mouth every 6 (six) hours as needed., Disp: , Rfl:  .  metoprolol succinate (TOPROL-XL) 25 MG 24 hr tablet, TAKE ONE (1) TABLET EACH DAY, Disp: 90 tablet, Rfl: 1 .  metoprolol tartrate (LOPRESSOR) 25 MG tablet, Take 1 tablet (25 mg total) by mouth as needed., Disp: 30 tablet, Rfl: 3 .  omeprazole (PRILOSEC) 20 MG capsule, Take 1 capsule (20 mg total) by mouth daily., Disp: 90 capsule, Rfl: 1 .  OXYGEN, 3lpm with sleep and 2.5lpm with exertion AHC, Disp: , Rfl:  .  sodium chloride (OCEAN)  0.65 % nasal spray, Place 1 spray into the nose as needed. For dryness, Disp: , Rfl:  .  VENTOLIN HFA 108 (90 Base) MCG/ACT inhaler, Inhale 2 puffs into the lungs every 6 (six) hours as needed., Disp: , Rfl:  .  zolpidem (AMBIEN) 10 MG tablet, Take 1 tablet (10 mg total) by mouth at bedtime. for sleep, Disp: 30 tablet, Rfl: 5  Current Facility-Administered Medications:  .  cyanocobalamin ((VITAMIN B-12)) injection 1,000 mcg, 1,000 mcg, Intramuscular, Q30 days, Hendricks Limes F, FNP, 1,000 mcg at 05/14/20 1405  Allergies  Allergen Reactions  . Belsomra [Suvorexant]     Hard to breathe, hot and sweats within 20 min of taking   . Temazepam   . Pravastatin Rash    Elevated heart rate per patient   Past Medical History:  Diagnosis Date  . AAA (abdominal aortic aneurysm) ([place])    Needs [place] on/after 06/25/2021. 28 mm  . Abnormal CT scan    SPN right mid zone. first detected 08/16/08; see CT [facility]   . Atrial flutter ([place])    a. echo 09/28/11 EF 50-55%, mild RAE, RV mildly dilated, systolic fxn mild-mod reduced;  b. 01/2012 s/p EPS & RFCA.  [name] Chronic back pain   . Chronic diastolic CHF (congestive heart failure) ([place])   . Claustrophobia   . COPD (chronic obstructive pulmonary disease) ([place])    [place]. PFTs 10/09/08 FEV1 1.39 (44%) ratio 40, DLCO 63%. 16% improvement after bronchodilator. overnight  pulse ox 02/28/09 5:78m pent with sat< 89 sleeping > declined further o2 07/05/09. HFA 75% 04/23/09.   [name] Dyslipidemia 03/03/2016  . GERD (gastroesophageal reflux disease)   . Hypertension   . Lung nodule   . Prostate enlargement   . Vitamin B12 deficiency 02/01/2018    Observations/Objective: A&O  No respiratory distress or wheezing audible over the phone Mood, judgement, and thought processes all WNL   Assessment and Plan: 1. Difficulty sleeping Well controlled on current regimen.  Needs a controlled substance agreement and urine drug screen when he comes in person. - zolpidem (AMBIEN) 10 MG tablet;  Take 1 tablet (10 mg total) by mouth at bedtime. for sleep  Dispense: 30 tablet; Refill: 5  2. Gastroesophageal reflux disease without esophagitis Well controlled on current regimen.  - omeprazole (PRILOSEC) 20 MG capsule; Take 1 capsule (20 mg total) by mouth daily.  Dispense: 90 capsule; Refill: 1  3. Wound of left lower extremity, subsequent encounter Managed by Dr. Constance Haw.   Follow Up Instructions: Return ASAP in person, for follow-up of chronic medication conditions.  Patient is going to call back to schedule as soon as he is able to get up moving around and has a ride.  I discussed the assessment and treatment plan with the patient. The patient was provided an opportunity to ask questions and all were answered. The patient agreed with the plan and demonstrated an understanding of the instructions.   The patient was advised to call back or seek an in-person evaluation if the symptoms worsen or if the condition fails to improve as anticipated.  The above assessment and management plan was discussed with the patient. The patient verbalized understanding of and has agreed to the management plan. Patient is aware to call the clinic if symptoms persist or worsen. Patient is aware when to return to the clinic for a follow-up visit. Patient educated on when it is appropriate to go to the emergency department.   Time call ended: 12:19 PM  I provided 18 minutes of non-face-to-face time during this encounter.  Hendricks Limes, MSN, APRN, FNP-C [facility] 10/10/20

## 2020-10-11 ENCOUNTER — Ambulatory Visit (INDEPENDENT_AMBULATORY_CARE_PROVIDER_SITE_OTHER): Payer: Self-pay | Admitting: General Surgery

## 2020-10-11 ENCOUNTER — Encounter: Payer: Self-pay | Admitting: General Surgery

## 2020-10-11 ENCOUNTER — Other Ambulatory Visit: Payer: Self-pay

## 2020-10-11 VITALS — BP 145/81 | HR 90 | Temp 98.5°F | Resp 18 | Ht 67.0 in

## 2020-10-11 DIAGNOSIS — S8012XA Contusion of left lower leg, initial encounter: Secondary | ICD-10-CM

## 2020-10-11 NOTE — Patient Instructions (Signed)
Keep elevated and continue dressing changes.

## 2020-10-11 NOTE — Progress Notes (Signed)
[facility]   HPI:  75 y.o. Male presents to clinic for follow-up evaluation of his left leg wound. Patient reports he is miserable because he cannot get comfortable to sleep but his wound is being changed everyday by his sister Clarise Cruz who is here today.  Review of Systems:  Drain from wound Swelling LLE  All other review of systems: otherwise negative   Vital Signs:  BP (!) 145/81   Pulse 90   Temp 98.5 F (36.9 C) (Oral)   Resp 18   Ht 5\' 7"  (1.702 m)   SpO2 91%   BMI 28.32 kg/m    Physical Exam:  Physical Exam Vitals reviewed.  Cardiovascular:     Rate and Rhythm: Normal rate.  Pulmonary:     Effort: Pulmonary effort is normal.  Abdominal:     General: There is no distension.     Palpations: Abdomen is soft.     Tenderness: There is no abdominal tenderness.  Musculoskeletal:     Comments: Left lower extremity swelling in lower leg and foot, edges of wound slightly macerated, granulation at base, minimal necrotic eschar on edge of wound, no signs of infection         Assessment:  75 y.o. yo Male with left lower extremity traumatic hematoma/ wound. Doing better but uncomfortable from back pain and chronic issues.   Plan:  Keep elevated and continue dressing changes.   Future Appointments  Date Time Provider [place]  10/18/2020  2:45 PM Virl Cagey, MD [facility] None      Curlene Labrum, MD [facility] [address] [place], [place] [address] [phone] (office)

## 2020-10-18 ENCOUNTER — Other Ambulatory Visit: Payer: Self-pay

## 2020-10-18 ENCOUNTER — Ambulatory Visit (INDEPENDENT_AMBULATORY_CARE_PROVIDER_SITE_OTHER): Payer: Self-pay | Admitting: General Surgery

## 2020-10-18 VITALS — BP 130/84 | HR 84 | Temp 97.0°F | Resp 16 | Ht 67.0 in | Wt 180.0 lb

## 2020-10-18 DIAGNOSIS — S8012XA Contusion of left lower leg, initial encounter: Secondary | ICD-10-CM

## 2020-10-18 NOTE — Patient Instructions (Signed)
Leave Xeroform (Yellow bandage in place). Replace the pad and ACE daily. Do not get the leg wet with showering. Cover with a trash bag.

## 2020-10-18 NOTE — Progress Notes (Signed)
[facility]   HPI:  75 y.o. Male presents to clinic for follow-up evaluation of the left leg. He is doing the dressing changes. Patient reports he is having extreme pain during the dressing changes and is still not able to sleep much.   Review of Systems:  No fevers Continued pain All other review of systems: otherwise negative   Vital Signs:  BP 130/84   Pulse 84   Temp (!) 97 F (36.1 C) (Other (Comment))   Resp 16   Ht 5\' 7"  (1.702 m)   Wt 180 lb (81.6 kg)   SpO2 91%   BMI 28.19 kg/m    Physical Exam:  Physical Exam Vitals reviewed.  Cardiovascular:     Rate and Rhythm: Normal rate.  Pulmonary:     Effort: Pulmonary effort is normal.  Musculoskeletal:     Comments: Left leg with some maceration on the edges from epidermis being damp, decreased swelling, granulation at base and some epithelization /budding        Assessment:  75 y.o. yo Male with a healing traumatic left leg wound. Replaced dressing with Xeroform and will leave the Xeroform in place to act like a dressing/ scab like with a superficial burn given the pain with dressing changes and the maceration. hopefully this will keep the area from getting some damp on the edges.   Plan:  Leave Xeroform (Yellow bandage in place). Replace the pad and ACE daily. Do not get the leg wet with showering. Cover with a trash bag.   Future Appointments  Date Time Provider [place]  10/25/2020  2:30 PM Virl Cagey, MD [facility] None    Curlene Labrum, MD [facility] [address] [place], [place] [address] [phone] (office)

## 2020-10-25 ENCOUNTER — Ambulatory Visit (INDEPENDENT_AMBULATORY_CARE_PROVIDER_SITE_OTHER): Payer: Self-pay | Admitting: General Surgery

## 2020-10-25 ENCOUNTER — Other Ambulatory Visit: Payer: Self-pay

## 2020-10-25 VITALS — BP 130/78 | HR 95 | Temp 97.1°F | Resp 18 | Ht 67.0 in | Wt 177.0 lb

## 2020-10-25 DIAGNOSIS — S8012XA Contusion of left lower leg, initial encounter: Secondary | ICD-10-CM

## 2020-10-25 NOTE — Patient Instructions (Signed)
Keep adaptic on the wound and place neosporin on it daily. Keep covered with ace wrap and elevated as much as possible.

## 2020-10-25 NOTE — Progress Notes (Signed)
[facility]   HPI:  75 y.o. Male presents to clinic for follow-up evaluation of his left leg hematoma wound site. Patient reports he is still having drainage through the xeroform. He says pain is somewhat better not having to change the dressing daily. Swelling continues in legs.  Review of Systems:  No fevers or chills  All other review of systems: otherwise negative   Vital Signs:  BP 130/78   Pulse 95   Temp (!) 97.1 F (36.2 C) (Other (Comment))   Resp 18   Ht 5\' 7"  (1.702 m)   Wt 177 lb (80.3 kg)   SpO2 92%   BMI 27.72 kg/m    Physical Exam:  Physical Exam Cardiovascular:     Rate and Rhythm: Normal rate.  Musculoskeletal:     Comments: Left leg swelling, pitting edema, stasis changes to skin, drainage from xeroform, xeroform removed, some epithelization over granulation in the base, edges decreasing in size   Neurological:     Mental Status: He is alert.       Assessment:  75 y.o. yo Male with healing left leg wound. Placed adaptic on the wound bed and did triple antibiotic on top. Will try to get a better balance of moisture and drainage in the wound but difficult given the pain with dressing changes he has. Will see back next week   Plan:  Keep adaptic on the wound and place neosporin on it daily. Keep covered with ace wrap and elevated as much as possible.   Future Appointments  Date Time Provider [place]  10/25/2020  2:30 PM Virl Cagey, MD [facility] None  11/01/2020  2:15 PM Virl Cagey, MD [facility] None     Curlene Labrum, MD [facility] [address] [place], [place] [address] [phone] (office)

## 2020-11-01 ENCOUNTER — Encounter: Payer: Self-pay | Admitting: General Surgery

## 2020-11-01 ENCOUNTER — Other Ambulatory Visit: Payer: Self-pay

## 2020-11-01 ENCOUNTER — Ambulatory Visit (INDEPENDENT_AMBULATORY_CARE_PROVIDER_SITE_OTHER): Payer: Medicare Other | Admitting: General Surgery

## 2020-11-01 VITALS — BP 127/79 | HR 81 | Temp 98.3°F | Resp 16 | Ht 67.0 in | Wt 175.0 lb

## 2020-11-01 DIAGNOSIS — L03116 Cellulitis of left lower limb: Secondary | ICD-10-CM

## 2020-11-01 MED ORDER — DOXYCYCLINE HYCLATE 50 MG PO CAPS: 50.0000 mg | ORAL_CAPSULE | Freq: Two times a day (BID) | ORAL | 0 refills | Status: AC

## 2020-11-01 NOTE — Patient Instructions (Signed)
Keep elevated and wrapped Neosporin to area daily Monitor for changes or concerns Take antibiotic given slight increase in swelling and redness  Will send Hendricks Limes a message regarding the sweating and lab work.

## 2020-11-02 NOTE — Progress Notes (Signed)
[facility]   HPI:  75 y.o. Male presents to clinic for follow-up evaluation of his left leg hematoma. Have done adaptic and neosporin for the last week. He has been on his feet more and has more swelling. Continued drainage.   Review of Systems:  + swelling Drainage Pain/ tenderness in leg  All other review of systems: otherwise negative   Vital Signs:  BP 127/79   Pulse 81   Temp 98.3 F (36.8 C) (Oral)   Resp 16   Ht 5\' 7"  (1.702 m)   Wt 175 lb (79.4 kg)   SpO2 (!) 86% Comment: On o2  BMI 27.41 kg/m    Physical Exam:  Physical Exam Vitals reviewed.  Cardiovascular:     Rate and Rhythm: Normal rate.  Pulmonary:     Effort: Pulmonary effort is normal.  Musculoskeletal:     Comments: Left let wound decreasing in size, adaptic changed and neosporin placed, slight increase in erythema and swelling  Neurological:     Mental Status: He is alert.       Assessment:  75 y.o. yo Male with healing left leg wound but having some more swelling and redness. Given history will do a round of antibiotics to ensure not getting cellulitis.  Also reporting issues with excessive sweating, some at night but some in the day. Unsure if this is anything to be concerned about. No fevers or weight loss reported.   Plan:  Keep elevated and wrapped Neosporin to area daily Monitor for changes or concerns Take Doxycycline for next 5 days given slight increase in swelling and redness  Will send Hendricks Limes a message regarding the sweating and lab work.   Future Appointments  Date Time Provider [place]  11/13/2020 12:00 PM Virl Cagey, MD [facility] None     Curlene Labrum, MD [name] [address] [place],  [address] [phone] (office)

## 2020-11-06 ENCOUNTER — Other Ambulatory Visit: Payer: Self-pay | Admitting: Cardiology

## 2020-11-06 DIAGNOSIS — I4892 Unspecified atrial flutter: Secondary | ICD-10-CM

## 2020-11-06 NOTE — Telephone Encounter (Signed)
38m, 79.4kg, scr 0.56 11/09/19, lovw/hochrein 04/25/20

## 2020-11-07 ENCOUNTER — Telehealth: Payer: Self-pay | Admitting: *Deleted

## 2020-11-07 NOTE — Telephone Encounter (Signed)
Pt called and appt made for follow up

## 2020-11-07 NOTE — Telephone Encounter (Signed)
-----   Message from Loman Brooklyn, [place] sent at 11/06/2020  4:53 PM EDT ----- Regarding: FW: leg wound/ sweating complaints Please schedule patient for routine follow-up.  Britney ----- Message ----- From: Virl Cagey, MD Sent: 11/02/2020   3:21 PM EDT To: Loman Brooklyn, FNP Subject: leg wound/ sweating complaints                 Britney,  Leg wound slowly healing, see photos in epic. I am seeing him about weekly right now.  He complains of constant sweating worse at night. I do not think it is probably anything but I told him I would reach out to you. No fevers or other B symptoms. He has not had lab work in 1 year.  He said he is due to see you anway.   Can you get him scheduled and order some lab work given his concerns.  Wound overall improving, did a little doxy this time bc was looking little more red around it. Think prob just swelling but being safe.  Curlene Labrum, MD [facility] [address] [place], [place] [address] [phone] (office)

## 2020-11-13 ENCOUNTER — Encounter: Payer: Self-pay | Admitting: General Surgery

## 2020-11-13 ENCOUNTER — Other Ambulatory Visit: Payer: Self-pay

## 2020-11-13 ENCOUNTER — Ambulatory Visit (INDEPENDENT_AMBULATORY_CARE_PROVIDER_SITE_OTHER): Payer: Medicare Other | Admitting: General Surgery

## 2020-11-13 VITALS — BP 147/85 | HR 94 | Temp 98.2°F | Resp 20 | Ht 67.0 in | Wt 175.0 lb

## 2020-11-13 DIAGNOSIS — L03116 Cellulitis of left lower limb: Secondary | ICD-10-CM

## 2020-11-13 NOTE — Progress Notes (Signed)
[facility]   HPI:  75 y.o. Male presents to clinic for  follow-up evaluation of left leg wound. Patient reports less drainage and improving pain but still very sensitive.   Review of Systems:  Continued sensitivity  Some drainage All other review of systems: otherwise negative   Vital Signs:  BP (!) 147/85   Pulse 94   Temp 98.2 F (36.8 C) (Oral)   Resp 20   Ht 5\' 7"  (1.702 m)   Wt 175 lb (79.4 kg)   SpO2 (!) 86%   BMI 27.41 kg/m    Physical Exam:  Physical Exam Vitals reviewed.  Cardiovascular:     Rate and Rhythm: Normal rate.  Musculoskeletal:     Comments: Left leg wound with some epithelization on edges, some granulation getting hypertrophic, minor debridement, puraply wound mattrix applied and mepliex dressing  Neurological:     General: No focal deficit present.       Assessment:  75 y.o. yo Male with healing left leg wound after hematoma. Doing well but still with sensitivity to dressing changes.  Plan:  - Puraply can be left on for 1 week, mepilex in place to cover, replace if too much drainage  Future Appointments  Date Time Provider [place]  11/16/2020  3:50 PM Loman Brooklyn, FNP [facility] None  11/20/2020 12:00 PM Virl Cagey, MD [facility] None     All of the above recommendations were discussed with the patient and patient's family, and all of patient's and family's questions were answered to their expressed satisfaction.  Curlene Labrum, MD [facility] [address] [place], [place] [address] [phone] (office)

## 2020-11-13 NOTE — Patient Instructions (Signed)
Keep wound covered with ace and mepliex pad.  Replace mepliex pad if needed on 4 day.

## 2020-11-16 ENCOUNTER — Ambulatory Visit (INDEPENDENT_AMBULATORY_CARE_PROVIDER_SITE_OTHER): Payer: Medicare Other | Admitting: Family Medicine

## 2020-11-16 ENCOUNTER — Other Ambulatory Visit: Payer: Self-pay

## 2020-11-16 ENCOUNTER — Encounter: Payer: Self-pay | Admitting: Family Medicine

## 2020-11-16 VITALS — BP 156/82 | HR 67 | Temp 98.3°F

## 2020-11-16 DIAGNOSIS — M545 Low back pain, unspecified: Secondary | ICD-10-CM

## 2020-11-16 DIAGNOSIS — G479 Sleep disorder, unspecified: Secondary | ICD-10-CM | POA: Diagnosis not present

## 2020-11-16 DIAGNOSIS — E538 Deficiency of other specified B group vitamins: Secondary | ICD-10-CM | POA: Diagnosis not present

## 2020-11-16 DIAGNOSIS — I10 Essential (primary) hypertension: Secondary | ICD-10-CM

## 2020-11-16 DIAGNOSIS — Z79899 Other long term (current) drug therapy: Secondary | ICD-10-CM

## 2020-11-16 DIAGNOSIS — J9611 Chronic respiratory failure with hypoxia: Secondary | ICD-10-CM

## 2020-11-16 DIAGNOSIS — I714 Abdominal aortic aneurysm, without rupture, unspecified: Secondary | ICD-10-CM

## 2020-11-16 DIAGNOSIS — E785 Hyperlipidemia, unspecified: Secondary | ICD-10-CM

## 2020-11-16 DIAGNOSIS — G8929 Other chronic pain: Secondary | ICD-10-CM

## 2020-11-16 DIAGNOSIS — S81802D Unspecified open wound, left lower leg, subsequent encounter: Secondary | ICD-10-CM

## 2020-11-16 DIAGNOSIS — J9612 Chronic respiratory failure with hypercapnia: Secondary | ICD-10-CM

## 2020-11-16 DIAGNOSIS — J449 Chronic obstructive pulmonary disease, unspecified: Secondary | ICD-10-CM

## 2020-11-16 DIAGNOSIS — I483 Typical atrial flutter: Secondary | ICD-10-CM

## 2020-11-16 DIAGNOSIS — I5032 Chronic diastolic (congestive) heart failure: Secondary | ICD-10-CM

## 2020-11-16 DIAGNOSIS — K219 Gastro-esophageal reflux disease without esophagitis: Secondary | ICD-10-CM

## 2020-11-16 DIAGNOSIS — Z7901 Long term (current) use of anticoagulants: Secondary | ICD-10-CM

## 2020-11-16 DIAGNOSIS — R61 Generalized hyperhidrosis: Secondary | ICD-10-CM

## 2020-11-16 NOTE — Progress Notes (Signed)
Assessment & Plan:  1. COPD GOLD III with reversible component  - Well controlled on current regimen. Continue oxygen at 2.5L during the day and 3L at night due to hypoxia without it.   2. Chronic respiratory failure with hypoxia and hypercapnia (HCC) Continue oxygen at 2.5L during the day and 3L at night due to hypoxia without it.   3. Vitamin B12 deficiency Labs to assess. - Vitamin B12  4. Difficulty sleeping Well controlled on current regimen.   5. Controlled substance agreement signed Signed for Ambien. Urine drug screen collected today.  PDMP reviewed with no concerning findings. - ToxASSURE Select 13 (MW), Urine  6. Gastroesophageal reflux disease without esophagitis Well controlled on current regimen.   7. Primary hypertension Well controlled on current regimen.  - CBC with Differential/Platelet - CMP14+EGFR - Lipid panel  8. Dyslipidemia Well controlled on current regimen.   9-12. Typical atrial flutter (HCC)/AAA (abdominal aortic aneurysm) without rupture (HCC)/Chronic diastolic heart failure (HCC)/Anticoagulant long-term use Managed by cardiology.  13. Chronic midline low back pain, unspecified whether sciatica present Managed by Dr. Nelva Bush.  14. Wound of left lower extremity, subsequent encounter Managed by Dr. Constance Haw.  15. Excessive sweating Labs to assess.  Offered a referral to dermatology, but patient would like to wait on this as he has a lot going on right now.  Discussed with him that I do not actually see or feel sweat where he is reportedly sweating at the moment. - TSH - T4, free   Return in about 4 months (around 03/18/2021) for follow-up of chronic medication conditions.  Hendricks Limes, MSN, APRN, FNP-C [facility]  Subjective:    Patient ID: Francisco Tanner, male    DOB: 1946/07/11, 75 y.o.   MRN: [id number]  Patient Care Team: Loman Brooklyn, FNP as PCP - General (Family Medicine) Minus Breeding, MD as PCP -  Cardiology (Cardiology) Luanne Bras, MD (Radiology) Suella Broad, MD (Physical Medicine and Rehabilitation) Tanda Rockers, MD as Consulting Physician (Pulmonary Disease)   Chief Complaint:  Chief Complaint  Patient presents with  . COPD    Check up of chronic medical conditions.  . Excessive Sweating    Patient states that it has been ongoing and getting worse.    HPI: Francisco Tanner is a 75 y.o. male presenting on 11/16/2020 for COPD (Check up of chronic medical conditions.) and Excessive Sweating (Patient states that it has been ongoing and getting worse.)  Patient is accompanied by his sister, whom he is okay with being present.   Vitamin B12 Deficiency: patient gets monthly B12 injections. His last vitamin B12 level was 11/09/2019, which was normal.   Pain: Patient sees Dr. Nelva Bush for pain management. He has T12, L1, L2, and L3 compression fractures status post cement augmentation. He has chronic pain in his left hip and has been operated on three times. He reports it still isn't right but that he is unable to have any further surgeries from a cardiac standpoint. He has tried gabapentin and steroid injections for pain which were not helpful. He is on Eliquis and cannot take NSAIDs. He has completed physical therapy in the past. He is currently taking Norco 10/325 QID.   Insomnia: patient's sleep is well controlled with Ambien. He has tried and failed temazepam, Belsomra, Lunesta, and melatonin.[name] He did not sleep through the night with the lower dose of 5 mg of Ambien.   COPD: patient gets his oxygen supplies from [place]. He wears oxygen at  2.5L via nasal cannula during the day and 3L at night. He seldom requires his Albuterol inhaler. He uses his Breo daily as prescribed.   GERD: well controlled with omeprazole.   Cardiac: AAA and is unable to tolerate any more scans as he is unable to lie flat due to his COPD. Conservative management. Managed by Dr. Percival Spanish,  cardiologist. His heart failure and atrial flutter are also managed by cardiology. He is on Eliquis and reports he bruises easily.   Hypertension: patient checks his BP daily at home. Reports his readings are normally 130-140s/77-78. He is unable to exercise due to pain.   Wound: patient currently has a wound on his left lower extremity that is managed by Dr. Constance Haw.   New complaints: Patient is concerned that he sweats all the time, all over. He feels it gets worse throughout the day. States he has to change his shirt 2-3x/night. He reports he is sweating right now, that it is running down his face, arms, and back.    Social history:  Relevant past medical, surgical, family and social history reviewed and updated as indicated. Interim medical history since our last visit reviewed.  Allergies and medications reviewed and updated.  DATA REVIEWED: CHART IN EPIC  ROS: Negative unless specifically indicated above in HPI.    Current Outpatient Medications:  .  ELIQUIS 5 MG TABS tablet, TAKE ONE TABLET BY MOUTH TWICE DAILY, Disp: 180 tablet, Rfl: 1 .  fluticasone furoate-vilanterol (BREO ELLIPTA) 200-25 MCG/INH AEPB, USE 1 INHALATION DAILY, Disp: , Rfl:  .  HYDROcodone-acetaminophen (NORCO) 10-325 MG tablet, Take 1 tablet by mouth every 6 (six) hours as needed., Disp: , Rfl:  .  metoprolol succinate (TOPROL-XL) 25 MG 24 hr tablet, Take 1 tablet (25 mg total) by mouth daily., Disp: 90 tablet, Rfl: 1 .  metoprolol tartrate (LOPRESSOR) 25 MG tablet, Take 1 tablet (25 mg total) by mouth as needed., Disp: 30 tablet, Rfl: 3 .  omeprazole (PRILOSEC) 20 MG capsule, Take 1 capsule (20 mg total) by mouth daily., Disp: 90 capsule, Rfl: 1 .  OXYGEN, 3lpm with sleep and 2.5lpm with exertion AHC, Disp: , Rfl:  .  sodium chloride (OCEAN) 0.65 % nasal spray, Place 1 spray into the nose as needed. For dryness, Disp: , Rfl:  .  VENTOLIN HFA 108 (90 Base) MCG/ACT inhaler, Inhale 2 puffs into the lungs every 6  (six) hours as needed., Disp: , Rfl:  .  zolpidem (AMBIEN) 10 MG tablet, Take 1 tablet (10 mg total) by mouth at bedtime. for sleep, Disp: 30 tablet, Rfl: 5  Current Facility-Administered Medications:  .  cyanocobalamin ((VITAMIN B-12)) injection 1,000 mcg, 1,000 mcg, Intramuscular, Q30 days, Hendricks Limes F, FNP, 1,000 mcg at 05/14/20 1405   Allergies  Allergen Reactions  . Belsomra [Suvorexant]     Hard to breathe, hot and sweats within 20 min of taking   . Temazepam   . Pravastatin Rash    Elevated heart rate per patient   Past Medical History:  Diagnosis Date  . AAA (abdominal aortic aneurysm) ([place])    Needs [place] on/after 06/25/2021. 28 mm  . Abnormal CT scan    SPN right mid zone. first detected 08/16/08; see [facility]   . Atrial flutter ([place])    a. echo 09/28/11 EF 50-55%, mild RAE, RV mildly dilated, systolic fxn mild-mod reduced;  b. 01/2012 s/p EPS & RFCA.  [name] Chronic back pain   . Chronic diastolic CHF (congestive heart failure) ([place])   .  Claustrophobia   . COPD (chronic obstructive pulmonary disease) ([place])    [place]. PFTs 10/09/08 FEV1 1.39 (44%) ratio 40, DLCO 63%. 16% improvement after bronchodilator. overnight pulse ox 02/28/09 5:63mpent with sat< 89 sleeping > declined further o2 07/05/09. HFA 75% 04/23/09.   .Marland KitchenDyslipidemia 03/03/2016  . GERD (gastroesophageal reflux disease)   . Hypertension   . Lung nodule   . Prostate enlargement   . Vitamin B12 deficiency 02/01/2018    Past Surgical History:  Procedure Laterality Date  . A FLUTTER ABLATION N/A 02/02/2012   Procedure: ABLATION A FLUTTER;  Surgeon: SDeboraha Sprang MD;  [facility];  Service: Cardiovascular;  Laterality: N/A;  . BACK SURGERY     x4  . CARDIOVERSION     in past  . CATARACT EXTRACTION W/PHACO Right 09/04/2014   Procedure: CATARACT EXTRACTION PHACO AND INTRAOCULAR LENS PLACEMENT RIGHT EYE CDE=12.15;  Surgeon: KTonny Branch MD;  Location: [facility] ORS;  Service: Ophthalmology;  Laterality: Right;  . hip  replacement surgery     L   . INCISION AND DRAINAGE HIP  09/24/2011   Procedure: IRRIGATION AND DEBRIDEMENT HIP;  Surgeon: FGearlean Alf MD;  Location: [facility];  Service: Orthopedics;  Laterality: Left;  . KNEE SURGERY Left   . KYPHOSIS SURGERY      Social History   Socioeconomic History  . Marital status: Legally Separated    Spouse name: Not on file  . Number of children: Not on file  . Years of education: Not on file  . Highest education level: Not on file  Occupational History  . Not on file  Tobacco Use  . Smoking status: Former Smoker    Packs/day: 0.50    Years: 20.00    Pack years: 10.00    Types: Cigarettes    Quit date: 07/28/2002    Years since quitting: 18.3  . Smokeless tobacco: Never Used  Vaping Use  . Vaping Use: Never used  Substance and Sexual Activity  . Alcohol use: Not Currently    Comment: occasionally at a social gathering  . Drug use: No  . Sexual activity: Not Currently  Other Topics Concern  . Not on file  Social History Narrative   Separated; children; retired.    Social Determinants of Health   Financial Resource Strain: Not on file  Food Insecurity: Not on file  Transportation Needs: Not on file  Physical Activity: Not on file  Stress: Not on file  Social Connections: Not on file  Intimate Partner Violence: Not on file        Objective:    BP (!) 156/82   Pulse 67   Temp 98.3 F (36.8 C) (Temporal)   SpO2 91% Comment: 2.5L O2 via [place]  Wt Readings from Last 3 Encounters:  11/13/20 175 lb (79.4 kg)  11/01/20 175 lb (79.4 kg)  10/25/20 177 lb (80.3 kg)    Physical Exam Vitals reviewed.  Constitutional:      General: He is not in acute distress.    Appearance: Normal appearance. He is not ill-appearing, toxic-appearing or diaphoretic.     Comments: Patient feels as if he has sweat dripping down his face, arms, and back currently but his skin is completely dry.   HENT:     Head: Normocephalic and atraumatic.  Eyes:      General: No scleral icterus.       Right eye: No discharge.        Left eye: No  discharge.     Conjunctiva/sclera: Conjunctivae normal.  Cardiovascular:     Rate and Rhythm: Normal rate and regular rhythm.     Heart sounds: Normal heart sounds. No murmur heard. No friction rub. No gallop.   Pulmonary:     Effort: Pulmonary effort is normal. No respiratory distress.     Breath sounds: Normal breath sounds. No stridor. No wheezing, rhonchi or rales.  Musculoskeletal:        General: Normal range of motion.     Cervical back: Normal range of motion.  Skin:    General: Skin is warm and dry.     Comments: LLE wound wrapped.   Neurological:     Mental Status: He is alert and oriented to person, place, and time. Mental status is at baseline.     Gait: Gait abnormal (riding in [facility]).  Psychiatric:        Mood and Affect: Mood normal.        Behavior: Behavior normal.        Thought Content: Thought content normal.        Judgment: Judgment normal.     Lab Results  Component Value Date   TSH 1.070 05/14/2020   Lab Results  Component Value Date   WBC 7.1 11/09/2019   HGB 13.3 11/09/2019   HCT 40.2 11/09/2019   MCV 91 11/09/2019   PLT 153 11/09/2019   Lab Results  Component Value Date   NA 143 11/09/2019   K 4.5 11/09/2019   CO2 31 (H) 11/09/2019   GLUCOSE 104 (H) 11/09/2019   BUN 11 11/09/2019   CREATININE 0.56 (L) 11/09/2019   BILITOT 0.7 11/09/2019   ALKPHOS 45 11/09/2019   AST 12 11/09/2019   ALT 7 11/09/2019   PROT 6.0 11/09/2019   ALBUMIN 3.7 11/09/2019   CALCIUM 8.9 11/09/2019   ANIONGAP 5 08/31/2014   GFR 143.85 01/29/2017   Lab Results  Component Value Date   CHOL 141 11/09/2019   Lab Results  Component Value Date   HDL 40 11/09/2019   Lab Results  Component Value Date   LDLCALC 91 11/09/2019   Lab Results  Component Value Date   TRIG 44 11/09/2019   Lab Results  Component Value Date   CHOLHDL 3.5 11/09/2019   Lab Results  Component Value  Date   HGBA1C (H) 10/28/2009    6.3 (NOTE) The ADA recommends the following therapeutic goal for glycemic control related to Hgb A1c measurement: Goal of therapy: <6.5 Hgb A1c  Reference: American Diabetes Association: Clinical Practice Recommendations 2010, Diabetes Care, 2010, 33: (Suppl  1).

## 2020-11-17 LAB — CBC WITH DIFFERENTIAL/PLATELET
Basophils Absolute: 0 10*3/uL (ref 0.0–0.2)
Basos: 1 %
EOS (ABSOLUTE): 0.1 10*3/uL (ref 0.0–0.4)
Eos: 1 %
Hematocrit: 42.5 % (ref 37.5–51.0)
Hemoglobin: 13.7 g/dL (ref 13.0–17.7)
Immature Grans (Abs): 0 10*3/uL (ref 0.0–0.1)
Immature Granulocytes: 0 %
Lymphocytes Absolute: 1 10*3/uL (ref 0.7–3.1)
Lymphs: 21 %
MCH: 29.7 pg (ref 26.6–33.0)
MCHC: 32.2 g/dL (ref 31.5–35.7)
MCV: 92 fL (ref 79–97)
Monocytes Absolute: 0.5 10*3/uL (ref 0.1–0.9)
Monocytes: 10 %
Neutrophils Absolute: 3.1 10*3/uL (ref 1.4–7.0)
Neutrophils: 67 %
Platelets: 155 10*3/uL (ref 150–450)
RBC: 4.61 x10E6/uL (ref 4.14–5.80)
RDW: 12.5 % (ref 11.6–15.4)
WBC: 4.7 10*3/uL (ref 3.4–10.8)

## 2020-11-17 LAB — CMP14+EGFR
ALT: 10 IU/L (ref 0–44)
AST: 16 IU/L (ref 0–40)
Albumin/Globulin Ratio: 1.7 (ref 1.2–2.2)
Albumin: 4.3 g/dL (ref 3.7–4.7)
Alkaline Phosphatase: 53 IU/L (ref 44–121)
BUN/Creatinine Ratio: 15 (ref 10–24)
BUN: 8 mg/dL (ref 8–27)
Bilirubin Total: 0.6 mg/dL (ref 0.0–1.2)
CO2: 29 mmol/L (ref 20–29)
Calcium: 9 mg/dL (ref 8.6–10.2)
Chloride: 99 mmol/L (ref 96–106)
Creatinine, Ser: 0.52 mg/dL — ABNORMAL LOW (ref 0.76–1.27)
Globulin, Total: 2.5 g/dL (ref 1.5–4.5)
Glucose: 106 mg/dL — ABNORMAL HIGH (ref 65–99)
Potassium: 4.3 mmol/L (ref 3.5–5.2)
Sodium: 143 mmol/L (ref 134–144)
Total Protein: 6.8 g/dL (ref 6.0–8.5)
eGFR: 106 mL/min/{1.73_m2} (ref >59)

## 2020-11-17 LAB — LIPID PANEL
Chol/HDL Ratio: 3 ratio (ref 0.0–5.0)
Cholesterol, Total: 176 mg/dL (ref 100–199)
HDL: 58 mg/dL (ref >39)
LDL Chol Calc (NIH): 108 mg/dL — ABNORMAL HIGH (ref 0–99)
Triglycerides: 52 mg/dL (ref 0–149)
VLDL Cholesterol Cal: 10 mg/dL (ref 5–40)

## 2020-11-17 LAB — T4, FREE: Free T4: 1.37 ng/dL (ref 0.82–1.77)

## 2020-11-17 LAB — TSH: TSH: 1.1 u[IU]/mL (ref 0.450–4.500)

## 2020-11-17 LAB — VITAMIN B12: Vitamin B-12: 560 pg/mL (ref 232–1245)

## 2020-11-18 DIAGNOSIS — Z79899 Other long term (current) drug therapy: Secondary | ICD-10-CM | POA: Insufficient documentation

## 2020-11-18 DIAGNOSIS — S81802A Unspecified open wound, left lower leg, initial encounter: Secondary | ICD-10-CM | POA: Insufficient documentation

## 2020-11-20 ENCOUNTER — Ambulatory Visit (INDEPENDENT_AMBULATORY_CARE_PROVIDER_SITE_OTHER): Payer: Medicare Other | Admitting: General Surgery

## 2020-11-20 ENCOUNTER — Encounter: Payer: Self-pay | Admitting: Family Medicine

## 2020-11-20 ENCOUNTER — Encounter: Payer: Self-pay | Admitting: General Surgery

## 2020-11-20 ENCOUNTER — Other Ambulatory Visit: Payer: Self-pay

## 2020-11-20 VITALS — BP 143/70 | HR 93 | Temp 98.4°F | Resp 20 | Ht 67.0 in | Wt 175.0 lb

## 2020-11-20 DIAGNOSIS — S8012XA Contusion of left lower leg, initial encounter: Secondary | ICD-10-CM

## 2020-11-20 NOTE — Progress Notes (Signed)
[facility]   HPI:  75 y.o. Male presents to clinic for post-op follow-up evaluation of his left leg wound. We did puraply last week and he had no drainage on the mepilex. No issues and pain improving.  Review of Systems:  No fevers Less swelling  All other review of systems: otherwise negative   Vital Signs:  BP (!) 143/70   Pulse 93   Temp 98.4 F (36.9 C) (Other (Comment))   Resp 20   Ht 5\' 7"  (1.702 m)   Wt 175 lb (79.4 kg)   BMI 27.41 kg/m    Physical Exam:  Physical Exam Musculoskeletal:        General: Swelling present.     Left lower leg: Edema present.     Comments: Improving swelling and wound with epithelization on the central aspect, edges with some scab, hypertrophic granulation on the top and bottom, silver nitrate performed and cleaned with saline and new mepilex placed        Assessment:  75 y.o. yo Male with epithelization to most of the wound. Will need to be especially careful of this area for now. mepilex and ace replaced. Will only change this every 3rd day to prevent any shearing issues. No signs of infection, hypertrophic granulation treated with silver nitrate.   Plan:  Change bandage about every 3 days.   Future Appointments  Date Time Provider [place]  11/29/2020  1:00 PM Virl Cagey, MD [facility] None      Curlene Labrum, MD [facility] [address] [place], [place] [address] [phone] (office)

## 2020-11-20 NOTE — Patient Instructions (Signed)
Change bandage about every 3 days.

## 2020-11-23 LAB — TOXASSURE SELECT 13 (MW), URINE

## 2020-11-29 ENCOUNTER — Encounter: Payer: Self-pay | Admitting: General Surgery

## 2020-11-29 ENCOUNTER — Other Ambulatory Visit: Payer: Self-pay

## 2020-11-29 ENCOUNTER — Ambulatory Visit (INDEPENDENT_AMBULATORY_CARE_PROVIDER_SITE_OTHER): Payer: Medicare Other | Admitting: General Surgery

## 2020-11-29 VITALS — BP 150/59 | HR 90 | Temp 98.4°F | Resp 20 | Ht 67.0 in | Wt 177.0 lb

## 2020-11-29 DIAGNOSIS — S8012XA Contusion of left lower leg, initial encounter: Secondary | ICD-10-CM

## 2020-11-29 NOTE — Progress Notes (Signed)
[facility] Note   HPI:  75 y.o. Male presents to clinic for follow-up evaluation of his left leg. There is less swelling and drainage. They have changed the foam dressing.  Review of Systems:  Improved swelling  All other review of systems: otherwise negative   Vital Signs:  BP (!) 150/59   Pulse 90   Temp 98.4 F (36.9 C) (Other (Comment))   Resp 20   Ht 5\' 7"  (1.702 m)   Wt 177 lb (80.3 kg)   BMI 27.72 kg/m    Physical Exam:  Physical Exam Vitals reviewed.  Cardiovascular:     Rate and Rhythm: Normal rate.  Pulmonary:     Effort: Pulmonary effort is normal.  Musculoskeletal:     Comments: Epithelization of the wound, silver nitrate to edges without epithelium, scab left in place, less swollen   Neurological:     Mental Status: He is alert.       Assessment:  75 y.o. yo Male with healing left leg wound s/p hematoma and debridement/ evacuation. Doing well.  Plan:  Change dressing every 3-4 days  Ok to shower just cover with plastic and tape before.   Future Appointments  Date Time Provider [place]  12/13/2020  1:00 PM Virl Cagey, MD [facility] None      Curlene Labrum, MD [facility] [address] [place], [place] [address] [phone] (office)

## 2020-11-29 NOTE — Patient Instructions (Addendum)
Change dressing every 3-4 days  Ok to shower just cover with plastic and tape before.

## 2020-12-13 ENCOUNTER — Ambulatory Visit (INDEPENDENT_AMBULATORY_CARE_PROVIDER_SITE_OTHER): Payer: Medicare Other | Admitting: General Surgery

## 2020-12-13 ENCOUNTER — Encounter: Payer: Self-pay | Admitting: General Surgery

## 2020-12-13 ENCOUNTER — Other Ambulatory Visit: Payer: Self-pay

## 2020-12-13 VITALS — BP 150/82 | HR 76 | Temp 99.1°F | Resp 18 | Ht 67.0 in | Wt 178.0 lb

## 2020-12-13 DIAGNOSIS — S8012XA Contusion of left lower leg, initial encounter: Secondary | ICD-10-CM | POA: Diagnosis not present

## 2020-12-13 NOTE — Progress Notes (Signed)
[facility]   HPI:  75 y.o. Male presents to clinic for follow-up evaluation of his left leg. Patient reports doing well and having less drainage. He is still sweating a lot at night and is concerned about this. I have sent PCP a message but no lab abnormalities or anything obvious to cause this.   Review of Systems:  Night sweats All other review of systems: otherwise negative   Vital Signs:  BP (!) 150/82   Pulse 76   Temp 99.1 F (37.3 C) (Other (Comment))   Resp 18   Ht 5\' 7"  (1.702 m)   Wt 178 lb (80.7 kg)   BMI 27.88 kg/m    Physical Exam:  Physical Exam Vitals reviewed.  Cardiovascular:     Rate and Rhythm: Normal rate.  Pulmonary:     Effort: Pulmonary effort is normal.  Musculoskeletal:     Comments: Left leg wound healing, dried scab and dried blood removed, new epithelization  Neurological:     Mental Status: He is alert.   With scabbing   Scabbing removed      Assessment:  75 y.o. yo Male with left leg wound that is healing. Overall doing well. Still complains about night sweats. He takes Eliquis and his Brio at night. I have looked up and some people did have complaints of night sweats with Eliquis in phase IV trials.  I will let him know at his next visit. For now I recommended a night time bedside fan.  Plan:  Change mepliex foam dressing every 3-4 days.   Future Appointments  Date Time Provider [place]  12/27/2020  1:15 PM Virl Cagey, MD [facility] None    Curlene Labrum, MD [facility] [address] [place], [place] [address] [phone] (office)

## 2020-12-13 NOTE — Patient Instructions (Signed)
Change mepliex foam dressing every 3-4 days.

## 2020-12-27 ENCOUNTER — Other Ambulatory Visit: Payer: Self-pay

## 2020-12-27 ENCOUNTER — Ambulatory Visit (INDEPENDENT_AMBULATORY_CARE_PROVIDER_SITE_OTHER): Payer: Medicare Other | Admitting: General Surgery

## 2020-12-27 ENCOUNTER — Encounter: Payer: Self-pay | Admitting: General Surgery

## 2020-12-27 VITALS — BP 156/79 | HR 82 | Temp 98.7°F | Resp 20 | Ht 67.0 in | Wt 176.0 lb

## 2020-12-27 DIAGNOSIS — S8012XA Contusion of left lower leg, initial encounter: Secondary | ICD-10-CM | POA: Diagnosis not present

## 2020-12-27 NOTE — Patient Instructions (Addendum)
Leave dressing in place for 7 days and change next Thursday. You have 2 dressings you can change on 6/9 and 6/16 (or around those days). I will remove on 6/21.  Will likely be completely healed by then.

## 2020-12-29 NOTE — Progress Notes (Signed)
[facility]   HPI:  75 y.o. Male presents to clinic for follow-up evaluation of his left leg wound. It is healing and doing well.   Review of Systems:  Improved pain and swelling  All other review of systems: otherwise negative   Vital Signs:  BP (!) 156/79   Pulse 82   Temp 98.7 F (37.1 C) (Other (Comment))   Resp 20   Ht 5\' 7"  (1.702 m)   Wt 176 lb (79.8 kg)   SpO2 92%   BMI 27.57 kg/m    Physical Exam:  Physical Exam Cardiovascular:     Rate and Rhythm: Normal rate.  Pulmonary:     Effort: Pulmonary effort is normal.  Musculoskeletal:     Comments: Left leg wound with scabbing and no drainage, new epithelization        Assessment:  75 y.o. yo Male with healing left leg wound that still needs to be babied at this time.  Plan:  Leave dressing in place for 7 days and change next Thursday. You have 2 dressings you can change on 6/9 and 6/16 (or around those days). I will remove on 6/21.  Will likely be completely healed by then.   Future Appointments  Date Time Provider [place]  01/17/2021  1:00 PM Virl Cagey, MD [facility] None     Curlene Labrum, MD [facility] [address] [place], [place] [address] [phone] (office)   ;

## 2021-01-17 ENCOUNTER — Other Ambulatory Visit: Payer: Self-pay

## 2021-01-17 ENCOUNTER — Encounter: Payer: Self-pay | Admitting: General Surgery

## 2021-01-17 ENCOUNTER — Ambulatory Visit (INDEPENDENT_AMBULATORY_CARE_PROVIDER_SITE_OTHER): Payer: Medicare Other | Admitting: General Surgery

## 2021-01-17 VITALS — BP 146/80 | HR 95 | Temp 98.4°F | Resp 16 | Ht 67.0 in | Wt 183.0 lb

## 2021-01-17 DIAGNOSIS — S8012XA Contusion of left lower leg, initial encounter: Secondary | ICD-10-CM

## 2021-01-17 NOTE — Patient Instructions (Signed)
Shower and activity as tolerated.

## 2021-01-18 NOTE — Progress Notes (Signed)
[facility]   HPI:  75 y.o. Male presents to clinic for follow-up evaluation of his left leg. Patient reports no issues and improving pain.  Review of Systems:  No drainage No redness All other review of systems: otherwise negative   Vital Signs:  BP (!) 146/80   Pulse 95   Temp 98.4 F (36.9 C) (Other (Comment))   Resp 16   Ht 5\' 7"  (1.702 m)   Wt 183 lb (83 kg)   SpO2 92%   BMI 28.66 kg/m    Physical Exam:  Physical Exam Vitals reviewed.  Cardiovascular:     Rate and Rhythm: Normal rate.  Pulmonary:     Effort: Pulmonary effort is normal.  Musculoskeletal:     Comments: Left leg wound with no erythema or drainage, epithelized wound      Assessment:  75 y.o. yo Male with healed left leg wound. Told him to baby the area for a while.  Plan:  Shower and activity as tolerated.  PRN follow up   Curlene Labrum, MD [facility] [address] [place], [place] [address] [phone] (office)

## 2021-02-13 ENCOUNTER — Telehealth: Payer: Self-pay | Admitting: Family Medicine

## 2021-02-13 NOTE — Telephone Encounter (Signed)
**  [place] After Hours/ Emergency Line Call**  Patient: Francisco Tanner .  PCP: Loman Brooklyn, FNP  EMS calling to report that patient was found deceased in his home.  EMT notes that patient was sitting peacefully in his armchair with nasal canula in place but empty.  Lividity of lower legs noted with edema present.  No signs of foul play.  Last known well was 02/20/2021.  Suspect patient has passed from complications of CHF/ heart disease.  Ok to release patient's body.  Will cc PCP for death certificate completion.  Altie Savard M. Lajuana Ripple, DO

## 2021-02-25 DEATH — deceased
